# Patient Record
Sex: Female | Born: 1975
Health system: Southern US, Community
[De-identification: ages and names within clinical notes are randomized; demographics above are authoritative.]

## PROBLEM LIST (undated history)

## (undated) ENCOUNTER — Inpatient Hospital Stay (HOSPITAL_COMMUNITY): Payer: Self-pay

## (undated) DIAGNOSIS — R238 Other skin changes: Secondary | ICD-10-CM

## (undated) DIAGNOSIS — B373 Candidiasis of vulva and vagina: Secondary | ICD-10-CM

## (undated) DIAGNOSIS — M797 Fibromyalgia: Secondary | ICD-10-CM

## (undated) DIAGNOSIS — R233 Spontaneous ecchymoses: Secondary | ICD-10-CM

## (undated) DIAGNOSIS — Z87440 Personal history of urinary (tract) infections: Secondary | ICD-10-CM

## (undated) DIAGNOSIS — Z862 Personal history of diseases of the blood and blood-forming organs and certain disorders involving the immune mechanism: Secondary | ICD-10-CM

## (undated) DIAGNOSIS — Z860100 Personal history of colon polyps, unspecified: Secondary | ICD-10-CM

## (undated) DIAGNOSIS — B999 Unspecified infectious disease: Secondary | ICD-10-CM

## (undated) DIAGNOSIS — K529 Noninfective gastroenteritis and colitis, unspecified: Secondary | ICD-10-CM

## (undated) DIAGNOSIS — I82409 Acute embolism and thrombosis of unspecified deep veins of unspecified lower extremity: Secondary | ICD-10-CM

## (undated) DIAGNOSIS — R61 Generalized hyperhidrosis: Secondary | ICD-10-CM

## (undated) DIAGNOSIS — S92909A Unspecified fracture of unspecified foot, initial encounter for closed fracture: Secondary | ICD-10-CM

## (undated) DIAGNOSIS — B9689 Other specified bacterial agents as the cause of diseases classified elsewhere: Secondary | ICD-10-CM

## (undated) DIAGNOSIS — R634 Abnormal weight loss: Secondary | ICD-10-CM

## (undated) DIAGNOSIS — T7840XA Allergy, unspecified, initial encounter: Secondary | ICD-10-CM

## (undated) DIAGNOSIS — N926 Irregular menstruation, unspecified: Secondary | ICD-10-CM

## (undated) DIAGNOSIS — Z8742 Personal history of other diseases of the female genital tract: Secondary | ICD-10-CM

## (undated) DIAGNOSIS — D573 Sickle-cell trait: Secondary | ICD-10-CM

## (undated) DIAGNOSIS — N760 Acute vaginitis: Secondary | ICD-10-CM

## (undated) DIAGNOSIS — G43909 Migraine, unspecified, not intractable, without status migrainosus: Secondary | ICD-10-CM

## (undated) DIAGNOSIS — I2699 Other pulmonary embolism without acute cor pulmonale: Secondary | ICD-10-CM

## (undated) DIAGNOSIS — D219 Benign neoplasm of connective and other soft tissue, unspecified: Secondary | ICD-10-CM

## (undated) DIAGNOSIS — Z8371 Family history of colonic polyps: Secondary | ICD-10-CM

## (undated) DIAGNOSIS — M1990 Unspecified osteoarthritis, unspecified site: Secondary | ICD-10-CM

## (undated) DIAGNOSIS — F419 Anxiety disorder, unspecified: Secondary | ICD-10-CM

## (undated) DIAGNOSIS — Z8041 Family history of malignant neoplasm of ovary: Secondary | ICD-10-CM

## (undated) DIAGNOSIS — D649 Anemia, unspecified: Secondary | ICD-10-CM

## (undated) DIAGNOSIS — E669 Obesity, unspecified: Secondary | ICD-10-CM

## (undated) DIAGNOSIS — N63 Unspecified lump in unspecified breast: Secondary | ICD-10-CM

## (undated) DIAGNOSIS — IMO0002 Reserved for concepts with insufficient information to code with codable children: Secondary | ICD-10-CM

## (undated) DIAGNOSIS — I10 Essential (primary) hypertension: Secondary | ICD-10-CM

## (undated) DIAGNOSIS — D259 Leiomyoma of uterus, unspecified: Secondary | ICD-10-CM

## (undated) DIAGNOSIS — A599 Trichomoniasis, unspecified: Secondary | ICD-10-CM

## (undated) DIAGNOSIS — N938 Other specified abnormal uterine and vaginal bleeding: Secondary | ICD-10-CM

## (undated) DIAGNOSIS — C801 Malignant (primary) neoplasm, unspecified: Secondary | ICD-10-CM

## (undated) DIAGNOSIS — R5383 Other fatigue: Secondary | ICD-10-CM

## (undated) DIAGNOSIS — M545 Low back pain: Secondary | ICD-10-CM

## (undated) DIAGNOSIS — M542 Cervicalgia: Secondary | ICD-10-CM

## (undated) DIAGNOSIS — F329 Major depressive disorder, single episode, unspecified: Secondary | ICD-10-CM

## (undated) DIAGNOSIS — R011 Cardiac murmur, unspecified: Secondary | ICD-10-CM

## (undated) DIAGNOSIS — K219 Gastro-esophageal reflux disease without esophagitis: Secondary | ICD-10-CM

## (undated) DIAGNOSIS — R6889 Other general symptoms and signs: Secondary | ICD-10-CM

## (undated) DIAGNOSIS — M461 Sacroiliitis, not elsewhere classified: Secondary | ICD-10-CM

## (undated) DIAGNOSIS — K589 Irritable bowel syndrome without diarrhea: Secondary | ICD-10-CM

## (undated) DIAGNOSIS — Z83719 Family history of colon polyps, unspecified: Secondary | ICD-10-CM

## (undated) DIAGNOSIS — R112 Nausea with vomiting, unspecified: Secondary | ICD-10-CM

## (undated) DIAGNOSIS — Z803 Family history of malignant neoplasm of breast: Secondary | ICD-10-CM

## (undated) DIAGNOSIS — N83209 Unspecified ovarian cyst, unspecified side: Secondary | ICD-10-CM

## (undated) DIAGNOSIS — D759 Disease of blood and blood-forming organs, unspecified: Secondary | ICD-10-CM

## (undated) DIAGNOSIS — Z5189 Encounter for other specified aftercare: Secondary | ICD-10-CM

## (undated) HISTORY — DX: Low back pain: M54.5

## (undated) HISTORY — DX: Unspecified lump in unspecified breast: N63.0

## (undated) HISTORY — DX: Other skin changes: R23.8

## (undated) HISTORY — DX: Nausea with vomiting, unspecified: R11.2

## (undated) HISTORY — DX: Other fatigue: R53.83

## (undated) HISTORY — DX: Unspecified osteoarthritis, unspecified site: M19.90

## (undated) HISTORY — DX: Personal history of colonic polyps: Z86.010

## (undated) HISTORY — DX: Essential (primary) hypertension: I10

## (undated) HISTORY — DX: Fibromyalgia: M79.7

## (undated) HISTORY — DX: Other specified abnormal uterine and vaginal bleeding: N93.8

## (undated) HISTORY — DX: Abnormal weight loss: R63.4

## (undated) HISTORY — DX: Major depressive disorder, single episode, unspecified: F32.9

## (undated) HISTORY — DX: Spontaneous ecchymoses: R23.3

## (undated) HISTORY — DX: Anxiety disorder, unspecified: F41.9

## (undated) HISTORY — DX: Cardiac murmur, unspecified: R01.1

## (undated) HISTORY — DX: Family history of malignant neoplasm of ovary: Z80.41

## (undated) HISTORY — DX: Candidiasis of vulva and vagina: B37.3

## (undated) HISTORY — DX: Acute vaginitis: N76.0

## (undated) HISTORY — DX: Unspecified infectious disease: B99.9

## (undated) HISTORY — DX: Unspecified fracture of unspecified foot, initial encounter for closed fracture: S92.909A

## (undated) HISTORY — PX: CYSTECTOMY: SUR359

## (undated) HISTORY — DX: Obesity, unspecified: E66.9

## (undated) HISTORY — DX: Reserved for concepts with insufficient information to code with codable children: IMO0002

## (undated) HISTORY — DX: Migraine, unspecified, not intractable, without status migrainosus: G43.909

## (undated) HISTORY — DX: Personal history of other diseases of the female genital tract: Z87.42

## (undated) HISTORY — DX: Family history of colonic polyps: Z83.71

## (undated) HISTORY — DX: Gastro-esophageal reflux disease without esophagitis: K21.9

## (undated) HISTORY — DX: Allergy, unspecified, initial encounter: T78.40XA

## (undated) HISTORY — DX: Disease of blood and blood-forming organs, unspecified: D75.9

## (undated) HISTORY — DX: Family history of malignant neoplasm of breast: Z80.3

## (undated) HISTORY — PX: DILATION AND CURETTAGE OF UTERUS: SHX78

## (undated) HISTORY — DX: Irritable bowel syndrome, unspecified: K58.9

## (undated) HISTORY — DX: Other general symptoms and signs: R68.89

## (undated) HISTORY — DX: Noninfective gastroenteritis and colitis, unspecified: K52.9

## (undated) HISTORY — DX: Unspecified ovarian cyst, unspecified side: N83.209

## (undated) HISTORY — DX: Benign neoplasm of connective and other soft tissue, unspecified: D21.9

## (undated) HISTORY — DX: Personal history of diseases of the blood and blood-forming organs and certain disorders involving the immune mechanism: Z86.2

## (undated) HISTORY — DX: Cervicalgia: M54.2

## (undated) HISTORY — DX: Acute embolism and thrombosis of unspecified deep veins of unspecified lower extremity: I82.409

## (undated) HISTORY — DX: Anemia, unspecified: D64.9

## (undated) HISTORY — DX: Irregular menstruation, unspecified: N92.6

## (undated) HISTORY — DX: Sickle-cell trait: D57.3

## (undated) HISTORY — DX: Leiomyoma of uterus, unspecified: D25.9

## (undated) HISTORY — DX: Personal history of urinary (tract) infections: Z87.440

## (undated) HISTORY — DX: Generalized hyperhidrosis: R61

## (undated) HISTORY — PX: OTHER SURGICAL HISTORY: SHX169

## (undated) HISTORY — DX: Other specified bacterial agents as the cause of diseases classified elsewhere: B96.89

## (undated) HISTORY — DX: Sacroiliitis, not elsewhere classified: M46.1

## (undated) HISTORY — DX: Encounter for other specified aftercare: Z51.89

## (undated) HISTORY — DX: Family history of colon polyps, unspecified: Z83.719

## (undated) HISTORY — DX: Personal history of colon polyps, unspecified: Z86.0100

## (undated) HISTORY — DX: Trichomoniasis, unspecified: A59.9

## (undated) HISTORY — PX: WISDOM TOOTH EXTRACTION: SHX21

---

## 1995-04-10 DIAGNOSIS — R87619 Unspecified abnormal cytological findings in specimens from cervix uteri: Secondary | ICD-10-CM

## 1995-04-10 DIAGNOSIS — IMO0002 Reserved for concepts with insufficient information to code with codable children: Secondary | ICD-10-CM

## 1995-04-10 HISTORY — PX: CERVICAL CONE BIOPSY: SUR198

## 1995-04-10 HISTORY — DX: Reserved for concepts with insufficient information to code with codable children: IMO0002

## 1995-04-10 HISTORY — DX: Unspecified abnormal cytological findings in specimens from cervix uteri: R87.619

## 1997-08-17 ENCOUNTER — Ambulatory Visit (HOSPITAL_COMMUNITY): Admission: RE | Admit: 1997-08-17 | Discharge: 1997-08-17 | Payer: Self-pay | Admitting: *Deleted

## 2000-03-10 ENCOUNTER — Inpatient Hospital Stay (HOSPITAL_COMMUNITY): Admission: AD | Admit: 2000-03-10 | Discharge: 2000-03-10 | Payer: Self-pay | Admitting: Obstetrics & Gynecology

## 2000-04-16 ENCOUNTER — Encounter (HOSPITAL_COMMUNITY): Admission: RE | Admit: 2000-04-16 | Discharge: 2000-07-15 | Payer: Self-pay | Admitting: *Deleted

## 2000-06-13 ENCOUNTER — Inpatient Hospital Stay (HOSPITAL_COMMUNITY): Admission: AD | Admit: 2000-06-13 | Discharge: 2000-06-13 | Payer: Self-pay | Admitting: Obstetrics & Gynecology

## 2000-08-17 ENCOUNTER — Inpatient Hospital Stay (HOSPITAL_COMMUNITY): Admission: AD | Admit: 2000-08-17 | Discharge: 2000-08-18 | Payer: Self-pay | Admitting: Obstetrics

## 2000-10-11 ENCOUNTER — Encounter: Payer: Self-pay | Admitting: Obstetrics and Gynecology

## 2000-10-11 ENCOUNTER — Ambulatory Visit (HOSPITAL_COMMUNITY): Admission: RE | Admit: 2000-10-11 | Discharge: 2000-10-11 | Payer: Self-pay | Admitting: Obstetrics and Gynecology

## 2000-10-11 ENCOUNTER — Other Ambulatory Visit: Admission: RE | Admit: 2000-10-11 | Discharge: 2000-10-11 | Payer: Self-pay | Admitting: Obstetrics and Gynecology

## 2000-10-30 ENCOUNTER — Ambulatory Visit (HOSPITAL_COMMUNITY): Admission: RE | Admit: 2000-10-30 | Discharge: 2000-10-30 | Payer: Self-pay | Admitting: Obstetrics and Gynecology

## 2000-10-30 ENCOUNTER — Encounter: Payer: Self-pay | Admitting: Obstetrics and Gynecology

## 2000-11-19 ENCOUNTER — Encounter (INDEPENDENT_AMBULATORY_CARE_PROVIDER_SITE_OTHER): Payer: Self-pay

## 2000-11-19 ENCOUNTER — Inpatient Hospital Stay (HOSPITAL_COMMUNITY): Admission: RE | Admit: 2000-11-19 | Discharge: 2000-11-21 | Payer: Self-pay | Admitting: Obstetrics and Gynecology

## 2000-12-13 ENCOUNTER — Ambulatory Visit (HOSPITAL_COMMUNITY): Admission: RE | Admit: 2000-12-13 | Discharge: 2000-12-13 | Payer: Self-pay | Admitting: Obstetrics and Gynecology

## 2000-12-13 ENCOUNTER — Encounter: Payer: Self-pay | Admitting: Obstetrics and Gynecology

## 2001-01-17 ENCOUNTER — Ambulatory Visit (HOSPITAL_COMMUNITY): Admission: RE | Admit: 2001-01-17 | Discharge: 2001-01-17 | Payer: Self-pay | Admitting: Obstetrics and Gynecology

## 2001-01-17 ENCOUNTER — Encounter: Payer: Self-pay | Admitting: Obstetrics and Gynecology

## 2001-02-21 ENCOUNTER — Ambulatory Visit (HOSPITAL_COMMUNITY): Admission: RE | Admit: 2001-02-21 | Discharge: 2001-02-21 | Payer: Self-pay | Admitting: Obstetrics and Gynecology

## 2001-02-21 ENCOUNTER — Encounter: Payer: Self-pay | Admitting: Obstetrics and Gynecology

## 2001-04-09 DIAGNOSIS — N938 Other specified abnormal uterine and vaginal bleeding: Secondary | ICD-10-CM

## 2001-04-09 DIAGNOSIS — N63 Unspecified lump in unspecified breast: Secondary | ICD-10-CM

## 2001-04-09 DIAGNOSIS — N926 Irregular menstruation, unspecified: Secondary | ICD-10-CM

## 2001-04-09 DIAGNOSIS — Z8742 Personal history of other diseases of the female genital tract: Secondary | ICD-10-CM

## 2001-04-09 DIAGNOSIS — N83209 Unspecified ovarian cyst, unspecified side: Secondary | ICD-10-CM

## 2001-04-09 DIAGNOSIS — E669 Obesity, unspecified: Secondary | ICD-10-CM

## 2001-04-09 DIAGNOSIS — M545 Low back pain, unspecified: Secondary | ICD-10-CM

## 2001-04-09 DIAGNOSIS — Z87440 Personal history of urinary (tract) infections: Secondary | ICD-10-CM

## 2001-04-09 DIAGNOSIS — F32A Depression, unspecified: Secondary | ICD-10-CM

## 2001-04-09 HISTORY — DX: Low back pain, unspecified: M54.50

## 2001-04-09 HISTORY — DX: Personal history of other diseases of the female genital tract: Z87.42

## 2001-04-09 HISTORY — DX: Depression, unspecified: F32.A

## 2001-04-09 HISTORY — DX: Unspecified lump in unspecified breast: N63.0

## 2001-04-09 HISTORY — DX: Irregular menstruation, unspecified: N92.6

## 2001-04-09 HISTORY — DX: Obesity, unspecified: E66.9

## 2001-04-09 HISTORY — DX: Personal history of urinary (tract) infections: Z87.440

## 2001-04-09 HISTORY — DX: Other specified abnormal uterine and vaginal bleeding: N93.8

## 2001-04-09 HISTORY — DX: Unspecified ovarian cyst, unspecified side: N83.209

## 2001-04-11 ENCOUNTER — Inpatient Hospital Stay (HOSPITAL_COMMUNITY): Admission: AD | Admit: 2001-04-11 | Discharge: 2001-04-11 | Payer: Self-pay | Admitting: Obstetrics and Gynecology

## 2001-04-25 ENCOUNTER — Inpatient Hospital Stay (HOSPITAL_COMMUNITY): Admission: AD | Admit: 2001-04-25 | Discharge: 2001-04-26 | Payer: Self-pay | Admitting: Obstetrics and Gynecology

## 2001-05-22 ENCOUNTER — Other Ambulatory Visit: Admission: RE | Admit: 2001-05-22 | Discharge: 2001-05-22 | Payer: Self-pay | Admitting: Obstetrics and Gynecology

## 2001-12-26 ENCOUNTER — Ambulatory Visit (HOSPITAL_COMMUNITY): Admission: RE | Admit: 2001-12-26 | Discharge: 2001-12-26 | Payer: Self-pay | Admitting: Hematology & Oncology

## 2001-12-26 ENCOUNTER — Encounter: Payer: Self-pay | Admitting: Hematology & Oncology

## 2002-01-02 ENCOUNTER — Other Ambulatory Visit: Admission: RE | Admit: 2002-01-02 | Discharge: 2002-01-02 | Payer: Self-pay | Admitting: Obstetrics and Gynecology

## 2002-02-25 ENCOUNTER — Encounter: Payer: Self-pay | Admitting: Hematology & Oncology

## 2002-02-25 ENCOUNTER — Ambulatory Visit (HOSPITAL_COMMUNITY): Admission: RE | Admit: 2002-02-25 | Discharge: 2002-02-25 | Payer: Self-pay | Admitting: Hematology & Oncology

## 2002-04-09 DIAGNOSIS — M797 Fibromyalgia: Secondary | ICD-10-CM

## 2002-04-09 DIAGNOSIS — N760 Acute vaginitis: Secondary | ICD-10-CM

## 2002-04-09 HISTORY — DX: Fibromyalgia: M79.7

## 2002-04-09 HISTORY — DX: Acute vaginitis: N76.0

## 2002-06-13 ENCOUNTER — Encounter: Payer: Self-pay | Admitting: Emergency Medicine

## 2002-06-13 ENCOUNTER — Emergency Department (HOSPITAL_COMMUNITY): Admission: EM | Admit: 2002-06-13 | Discharge: 2002-06-13 | Payer: Self-pay | Admitting: Emergency Medicine

## 2002-09-11 ENCOUNTER — Inpatient Hospital Stay (HOSPITAL_COMMUNITY): Admission: AD | Admit: 2002-09-11 | Discharge: 2002-09-11 | Payer: Self-pay | Admitting: Obstetrics and Gynecology

## 2002-09-18 ENCOUNTER — Inpatient Hospital Stay (HOSPITAL_COMMUNITY): Admission: AD | Admit: 2002-09-18 | Discharge: 2002-09-18 | Payer: Self-pay | Admitting: Obstetrics and Gynecology

## 2002-09-19 ENCOUNTER — Encounter: Payer: Self-pay | Admitting: Obstetrics and Gynecology

## 2002-09-21 ENCOUNTER — Inpatient Hospital Stay (HOSPITAL_COMMUNITY): Admission: RE | Admit: 2002-09-21 | Discharge: 2002-09-21 | Payer: Self-pay | Admitting: Obstetrics and Gynecology

## 2002-09-21 ENCOUNTER — Encounter: Payer: Self-pay | Admitting: Obstetrics and Gynecology

## 2002-09-28 ENCOUNTER — Encounter: Payer: Self-pay | Admitting: Obstetrics and Gynecology

## 2002-09-28 ENCOUNTER — Ambulatory Visit (HOSPITAL_COMMUNITY): Admission: RE | Admit: 2002-09-28 | Discharge: 2002-09-28 | Payer: Self-pay | Admitting: Obstetrics and Gynecology

## 2003-01-05 ENCOUNTER — Other Ambulatory Visit: Admission: RE | Admit: 2003-01-05 | Discharge: 2003-01-05 | Payer: Self-pay | Admitting: Obstetrics and Gynecology

## 2003-04-10 DIAGNOSIS — Z5189 Encounter for other specified aftercare: Secondary | ICD-10-CM

## 2003-04-10 DIAGNOSIS — I10 Essential (primary) hypertension: Secondary | ICD-10-CM

## 2003-04-10 HISTORY — DX: Encounter for other specified aftercare: Z51.89

## 2003-04-10 HISTORY — DX: Essential (primary) hypertension: I10

## 2003-04-20 ENCOUNTER — Ambulatory Visit (HOSPITAL_COMMUNITY): Admission: RE | Admit: 2003-04-20 | Discharge: 2003-04-20 | Payer: Self-pay | Admitting: Obstetrics and Gynecology

## 2003-04-26 ENCOUNTER — Inpatient Hospital Stay (HOSPITAL_COMMUNITY): Admission: AD | Admit: 2003-04-26 | Discharge: 2003-04-27 | Payer: Self-pay | Admitting: Obstetrics and Gynecology

## 2003-07-13 ENCOUNTER — Inpatient Hospital Stay (HOSPITAL_COMMUNITY): Admission: AD | Admit: 2003-07-13 | Discharge: 2003-07-13 | Payer: Self-pay | Admitting: Obstetrics and Gynecology

## 2003-07-26 ENCOUNTER — Ambulatory Visit (HOSPITAL_COMMUNITY): Admission: RE | Admit: 2003-07-26 | Discharge: 2003-07-26 | Payer: Self-pay | Admitting: Obstetrics and Gynecology

## 2003-08-13 ENCOUNTER — Ambulatory Visit (HOSPITAL_COMMUNITY): Admission: RE | Admit: 2003-08-13 | Discharge: 2003-08-13 | Payer: Self-pay | Admitting: Obstetrics and Gynecology

## 2003-09-02 ENCOUNTER — Ambulatory Visit (HOSPITAL_COMMUNITY): Admission: RE | Admit: 2003-09-02 | Discharge: 2003-09-02 | Payer: Self-pay | Admitting: Obstetrics and Gynecology

## 2003-09-08 ENCOUNTER — Encounter (INDEPENDENT_AMBULATORY_CARE_PROVIDER_SITE_OTHER): Payer: Self-pay | Admitting: *Deleted

## 2003-09-08 ENCOUNTER — Inpatient Hospital Stay (HOSPITAL_COMMUNITY): Admission: AD | Admit: 2003-09-08 | Discharge: 2003-09-10 | Payer: Self-pay | Admitting: Obstetrics and Gynecology

## 2003-11-20 ENCOUNTER — Inpatient Hospital Stay (HOSPITAL_COMMUNITY): Admission: AD | Admit: 2003-11-20 | Discharge: 2003-11-20 | Payer: Self-pay | Admitting: Obstetrics and Gynecology

## 2003-12-06 ENCOUNTER — Other Ambulatory Visit: Admission: RE | Admit: 2003-12-06 | Discharge: 2003-12-06 | Payer: Self-pay | Admitting: Obstetrics and Gynecology

## 2003-12-07 ENCOUNTER — Ambulatory Visit (HOSPITAL_COMMUNITY): Admission: RE | Admit: 2003-12-07 | Discharge: 2003-12-07 | Payer: Self-pay | Admitting: Obstetrics and Gynecology

## 2004-04-09 DIAGNOSIS — Z8742 Personal history of other diseases of the female genital tract: Secondary | ICD-10-CM

## 2004-04-09 HISTORY — DX: Personal history of other diseases of the female genital tract: Z87.42

## 2005-01-24 ENCOUNTER — Other Ambulatory Visit: Admission: RE | Admit: 2005-01-24 | Discharge: 2005-01-24 | Payer: Self-pay | Admitting: Obstetrics and Gynecology

## 2005-04-09 DIAGNOSIS — K219 Gastro-esophageal reflux disease without esophagitis: Secondary | ICD-10-CM

## 2005-04-09 DIAGNOSIS — R011 Cardiac murmur, unspecified: Secondary | ICD-10-CM

## 2005-04-09 HISTORY — DX: Gastro-esophageal reflux disease without esophagitis: K21.9

## 2005-04-09 HISTORY — DX: Cardiac murmur, unspecified: R01.1

## 2005-07-18 ENCOUNTER — Encounter: Admission: RE | Admit: 2005-07-18 | Discharge: 2005-07-18 | Payer: Self-pay | Admitting: Internal Medicine

## 2005-11-30 ENCOUNTER — Ambulatory Visit (HOSPITAL_COMMUNITY): Admission: RE | Admit: 2005-11-30 | Discharge: 2005-11-30 | Payer: Self-pay | Admitting: Family Medicine

## 2005-12-02 ENCOUNTER — Inpatient Hospital Stay (HOSPITAL_COMMUNITY): Admission: AD | Admit: 2005-12-02 | Discharge: 2005-12-02 | Payer: Self-pay | Admitting: Obstetrics and Gynecology

## 2005-12-10 ENCOUNTER — Inpatient Hospital Stay (HOSPITAL_COMMUNITY): Admission: AD | Admit: 2005-12-10 | Discharge: 2005-12-10 | Payer: Self-pay | Admitting: Obstetrics and Gynecology

## 2006-02-19 ENCOUNTER — Other Ambulatory Visit: Admission: RE | Admit: 2006-02-19 | Discharge: 2006-02-19 | Payer: Self-pay | Admitting: Obstetrics and Gynecology

## 2007-03-03 ENCOUNTER — Ambulatory Visit (HOSPITAL_COMMUNITY): Admission: RE | Admit: 2007-03-03 | Discharge: 2007-03-03 | Payer: Self-pay | Admitting: General Surgery

## 2007-03-04 ENCOUNTER — Encounter: Admission: RE | Admit: 2007-03-04 | Discharge: 2007-06-02 | Payer: Self-pay | Admitting: General Surgery

## 2007-05-20 ENCOUNTER — Ambulatory Visit (HOSPITAL_COMMUNITY): Admission: RE | Admit: 2007-05-20 | Discharge: 2007-05-21 | Payer: Self-pay | Admitting: General Surgery

## 2007-05-20 HISTORY — PX: LAPAROSCOPIC GASTRIC BANDING: SHX1100

## 2007-06-11 ENCOUNTER — Encounter: Admission: RE | Admit: 2007-06-11 | Discharge: 2007-06-11 | Payer: Self-pay | Admitting: General Surgery

## 2008-02-20 ENCOUNTER — Encounter: Admission: RE | Admit: 2008-02-20 | Discharge: 2008-02-20 | Payer: Self-pay | Admitting: Obstetrics and Gynecology

## 2008-03-02 ENCOUNTER — Ambulatory Visit (HOSPITAL_COMMUNITY): Admission: RE | Admit: 2008-03-02 | Discharge: 2008-03-02 | Payer: Self-pay | Admitting: General Surgery

## 2008-07-30 ENCOUNTER — Ambulatory Visit: Payer: Self-pay | Admitting: Oncology

## 2008-08-06 LAB — CBC WITH DIFFERENTIAL/PLATELET
Basophils Absolute: 0 10*3/uL (ref 0.0–0.1)
Eosinophils Absolute: 0.1 10*3/uL (ref 0.0–0.5)
HCT: 37.5 % (ref 34.8–46.6)
MCH: 31 pg (ref 25.1–34.0)
MCV: 91.5 fL (ref 79.5–101.0)
MONO#: 0.3 10*3/uL (ref 0.1–0.9)
MONO%: 7.3 % (ref 0.0–14.0)
NEUT#: 1.4 10*3/uL — ABNORMAL LOW (ref 1.5–6.5)
NEUT%: 34 % — ABNORMAL LOW (ref 38.4–76.8)
RDW: 13.9 % (ref 11.2–14.5)
WBC: 4.3 10*3/uL (ref 3.9–10.3)
lymph#: 2.3 10*3/uL (ref 0.9–3.3)

## 2008-08-06 LAB — COMPREHENSIVE METABOLIC PANEL
Alkaline Phosphatase: 36 U/L — ABNORMAL LOW (ref 39–117)
BUN: 4 mg/dL — ABNORMAL LOW (ref 6–23)
CO2: 26 mEq/L (ref 19–32)
Creatinine, Ser: 1 mg/dL (ref 0.40–1.20)
Glucose, Bld: 77 mg/dL (ref 70–99)
Total Bilirubin: 0.6 mg/dL (ref 0.3–1.2)

## 2008-08-06 LAB — MORPHOLOGY: RBC Comments: NORMAL

## 2008-08-06 LAB — CHCC SMEAR

## 2008-08-11 LAB — APTT: aPTT: 33 seconds (ref 24–37)

## 2008-08-11 LAB — VON WILLEBRAND FACTOR MULTIMER
Factor-VIII Activity: 71 % (ref 50–180)
Von Willebrand Factor Ag: 124 % (ref >49)

## 2008-08-23 LAB — ANTI-DNA ANTIBODY, DOUBLE-STRANDED: ds DNA Ab: 2 IU/mL (ref <5)

## 2008-08-26 LAB — PLATELET AGGREGATION STUDY, BLOOD
ADP10: NORMAL
Arachidonic Acid: NORMAL
Collagen Aggregation: NORMAL
Epinephrine 10: NORMAL

## 2009-04-09 DIAGNOSIS — Z8742 Personal history of other diseases of the female genital tract: Secondary | ICD-10-CM

## 2009-04-09 HISTORY — DX: Personal history of other diseases of the female genital tract: Z87.42

## 2009-04-09 HISTORY — PX: AUGMENTATION MAMMAPLASTY: SUR837

## 2009-06-21 ENCOUNTER — Ambulatory Visit (HOSPITAL_COMMUNITY): Admission: RE | Admit: 2009-06-21 | Discharge: 2009-06-21 | Payer: Self-pay | Admitting: Psychiatry

## 2009-06-27 ENCOUNTER — Other Ambulatory Visit (HOSPITAL_COMMUNITY): Admission: RE | Admit: 2009-06-27 | Discharge: 2009-09-25 | Payer: Self-pay | Admitting: Psychiatry

## 2009-07-04 ENCOUNTER — Ambulatory Visit: Payer: Self-pay | Admitting: Psychiatry

## 2009-11-11 ENCOUNTER — Emergency Department (HOSPITAL_COMMUNITY): Admission: EM | Admit: 2009-11-11 | Discharge: 2009-11-12 | Payer: Self-pay | Admitting: Emergency Medicine

## 2009-11-14 ENCOUNTER — Ambulatory Visit: Payer: Self-pay | Admitting: Vascular Surgery

## 2009-11-14 ENCOUNTER — Ambulatory Visit: Admission: RE | Admit: 2009-11-14 | Discharge: 2009-11-14 | Payer: Self-pay

## 2010-04-09 HISTORY — PX: COSMETIC SURGERY: SHX468

## 2010-04-24 DIAGNOSIS — B373 Candidiasis of vulva and vagina: Secondary | ICD-10-CM

## 2010-04-24 DIAGNOSIS — B3731 Acute candidiasis of vulva and vagina: Secondary | ICD-10-CM

## 2010-04-24 HISTORY — DX: Candidiasis of vulva and vagina: B37.3

## 2010-04-24 HISTORY — DX: Acute candidiasis of vulva and vagina: B37.31

## 2010-05-01 ENCOUNTER — Encounter: Payer: Self-pay | Admitting: General Surgery

## 2010-06-23 LAB — DIFFERENTIAL
Basophils Relative: 0 % (ref 0–1)
Lymphocytes Relative: 52 % — ABNORMAL HIGH (ref 12–46)
Monocytes Absolute: 0.6 10*3/uL (ref 0.1–1.0)
Monocytes Relative: 10 % (ref 3–12)
Neutro Abs: 1.9 10*3/uL (ref 1.7–7.7)
Neutrophils Relative %: 33 % — ABNORMAL LOW (ref 43–77)

## 2010-06-23 LAB — POCT I-STAT, CHEM 8
Creatinine, Ser: 1 mg/dL (ref 0.4–1.2)
HCT: 31 % — ABNORMAL LOW (ref 36.0–46.0)
Hemoglobin: 10.5 g/dL — ABNORMAL LOW (ref 12.0–15.0)
TCO2: 28 mmol/L (ref 0–100)

## 2010-06-23 LAB — CBC
HCT: 29.2 % — ABNORMAL LOW (ref 36.0–46.0)
MCH: 31.1 pg (ref 26.0–34.0)
MCHC: 35.3 g/dL (ref 30.0–36.0)
MCV: 88.2 fL (ref 78.0–100.0)
RDW: 14.5 % (ref 11.5–15.5)

## 2010-08-22 NOTE — Op Note (Signed)
Tiffany [name], Tiffany [name]               ACCOUNT NO.:  [id number]   MEDICAL RECORD NO.:  [id number]          PATIENT TYPE:  OIB   LOCATION:  1527                         FACILITY:  [facility]   PHYSICIAN:  Sharlet Salina T. Hoxworth, M.D.DATE OF BIRTH:  24-May-1975   DATE OF PROCEDURE:  05/20/2007  DATE OF DISCHARGE:                               OPERATIVE REPORT   PRE AND POSTOPERATIVE DIAGNOSIS:  Morbid obesity.   SURGICAL PROCEDURES:  Placement laparoscopic adjustable gastric band  with hiatal hernia repair.   SURGEON:  Lorne Skeens. Hoxworth, M.D.   ASSISTANT:  Dr. Ovidio Kin   ANESTHESIA:  General.   BRIEF HISTORY:  [name] is a 35 year old female with progressive  morbid obesity unresponsive to medical management.  Following extensive  discussion of risks, benefits detailed elsewhere, we have elected to  proceed with laparoscopic adjustable gastric band placement.  She is now  brought to the operating room for this procedure.   DESCRIPTION OF OPERATION:  The patient brought to operating room and  placed in supine position on the operating table and general orotracheal  anesthesia was induced.  She received preoperative IV antibiotics.  Some  heparin was given subcutaneously preoperatively.  The abdomen was widely  sterilely prepped and draped.  Correct patient and procedure were  verified.  Local anesthesia was used to infiltrate the trocar sites  prior to the incision.  A 1 cm incision was made the left subcostal area  and access was obtained with 11 mm OptiVu trocar without difficulty and  pneumoperitoneum established.  Under direct vision a 15 mm trocar was  placed in the right upper quadrant through the falciform ligament and 11  mm trocar the right upper quadrant more medially, an 11 mm trocar in the  left above the umbilicus for camera port.  Through a 5-mm site in the  epigastrium the Nathanson's retractor was placed.  The left lobe of the  liver elevated with excellent exposure  of the hiatus and upper stomach.  Finally a 5 mm trocar was placed in the left flank.  Examination of the  hiatus revealed a good sized hiatal hernia.  Upper GI had suggested a  small to moderate hiatal hernia.  We elected to go ahead with repair.  The gastrocolic omentum was divided along the avascular area and this  dissection carried up over the top of the esophagus dividing the  peritoneum at the hiatus.  Lateral peritoneal attachments on the left  side room left in place for band of fixation.  The right crus was  identified and peritoneum along the anterior border of the right crus  was incised just below the esophagus and the right crus mobilized and  retroesophageal space entered.  The hiatus was dissected posteriorly and  the left crus was identified at its base and dissected more superiorly.  The distal esophagus was fully mobilized and brought down into the  abdomen under no tension and the crura and hiatus completely dissected.  After full dissection the hiatus was repaired with two posterior and one  anterior interrupted 0-0 Ethibond sutures.  After completion of  the  repair of the Ewald sizing tube was passed down into the stomach and  there was plenty of room for this through the hiatus with a little bit  of extra room to spare.  This appeared to be very adequate closure.  Attention was then turned to the band placement.  The peritoneum was  incised just anterior to the right crus more inferiorly at the area of  crossing fat and then careful blunt dissection carried into the  retrogastric area.  The finger dissector was passed into this space and  deployed up through the angle of his without difficulty.  A flushed AP  standard band system was then introduced in the abdomen and the tubing  and band brought back behind the stomach without difficulty.  The sizing  tube was then reintroduced into the stomach and the band buckled without  any tension.  The sizing tube removed.   Holding the tubing inferiorly.  The fundus of stomach was imbricated up over the band of the small  gastric pouch with several interrupted 2-0 Ethibond sutures.  The  abdomen inspected for hemostasis which appeared complete.  The tubing  was brought out through the right mid abdominal trocar site and  Nathanson retractor was removed, all CO2 evacuated and trocars removed.  Skin incisions were closed with interrupted or running subcuticular  Monocryl and Dermabond.  Sponge, needle and instrument counts correct.  The patient taken to recovery in good condition.      Lorne Skeens. Hoxworth, M.D.  Electronically Signed     BTH/MEDQ  D:  05/20/2007  T:  05/21/2007  Job:  [id number]

## 2010-08-22 NOTE — Op Note (Signed)
[name], Tiffany [name]               ACCOUNT NO.:  [id number]   MEDICAL RECORD NO.:  [id number]          PATIENT TYPE:  AMB   LOCATION:  DAY                          FACILITY:  [facility]   PHYSICIAN:  Sharlet Salina T. Hoxworth, M.D.DATE OF BIRTH:  [birth year]   DATE OF PROCEDURE:  03/02/2008  DATE OF DISCHARGE:  03/02/2008                               OPERATIVE REPORT   PREOPERATIVE DIAGNOSIS:  Status post lap band with leaking port.   POSTOPERATIVE DIAGNOSIS:  Status post lap band with leaking port.   SURGICAL PROCEDURES:  Replacement of lap band port.   SURGEON:  Lorne Skeens. Hoxworth, M.D.   ANESTHESIA:  General.   BRIEF HISTORY:  [name] is a 34 year old female status post lap  band placement on May 20, 2007.  She has had recently good weight  loss about 45 pounds but on three successive fills, we had we have found  less fluid in her band than expected and she has experienced immediate  loss of restriction a few days after the fill.  This consistent with a  leaking lap band port and I have recommended replacement.  The  indications for the procedure, its nature, risks of bleeding and  infection were discussed and understood.  She is now brought to the  operating room for this procedure.   DESCRIPTION OF OPERATION:  The patient was brought to the operating room  and placed in supine position on the operating table and general  anesthesia induced.  The abdomen was widely sterilely prepped and  draped.  She received preoperative IV antibiotics.  PAS were placed.  Correct patient and procedure were verified.  The previous transverse  incision in the right upper quadrant was excised and dissection carried  down through subcutaneous tissue onto the port.  The previous sutures  were removed.  Scar tissue removed and the port mobilized up out of the  incision.  At this point I clamped the tubing and injected the port  under the pressure but could not identify a specific leak.  I  aspirated  and found 5 of the expected 6 mL present.  The tubing was then divided  about 8 or 10 cm from the hub.  A new flushed port was attached.  This  was then sutured to the anterior fascia with four interrupted 2-0  Prolene sutures and the tubing introduced back in the abdominal cavity  with a smooth curve.  This port was then flushed and aspirated removing  all air and was filled with 5.5 mL.  Soft tissue had been infiltrated  with Marcaine previously.  There was no bleeding.  The wound was  irrigated.  The subcu was closed with running 2-0 Vicryl and the skin  with subcuticular Monocryl and Dermabond.  Sponge, needle and instrument  counts correct.  The patient taken to recovery in good condition.      Lorne Skeens. Hoxworth, M.D.  Electronically Signed     BTH/MEDQ  D:  03/02/2008  T:  03/03/2008  Job:  [id number]

## 2010-08-25 NOTE — Discharge Summary (Signed)
[name], Tiffany                         ACCOUNT NO.:  [id number]   MEDICAL RECORD NO.:  [id number]                   PATIENT TYPE:  OUT   LOCATION:  ULT                                  FACILITY:  [facility]   PHYSICIAN:  Tiffany A. Dillard, M.D.              DATE OF BIRTH:  Mar 04, 1976   DATE OF ADMISSION:  09/02/2003  DATE OF DISCHARGE:  09/02/2003                                 DISCHARGE SUMMARY   ADDENDUM:  Labs are as follows.  Hemoglobin 12.7, platelets 287.  She is A  positive, antibody negative.  Sickle cell trait positive.  Rubella immune.  Hepatitis B surface antigen was negative.  HIV nonreactive.  Pap was within  normal limits.  Gonorrhea and Chlamydia are negative.  Cystic fibrosis is  negative.  GC, Chlamydia and group B strep at 36 weeks are negative.  Quad  screen was within normal limits.   NOTE:  Please add these labs to #[id number].                                               Tiffany [name], M.D.    NAD/MEDQ  D:  09/07/2003  T:  09/07/2003  Job:  [id number]

## 2010-08-25 NOTE — H&P (Signed)
Tiffany [name], Tiffany [name]                         ACCOUNT NO.:  [id number]   MEDICAL RECORD NO.:  [id number]                   PATIENT TYPE:  OUT   LOCATION:  ULT                                  FACILITY:  [facility]   PHYSICIAN:  Naima A. Dillard, M.D.              DATE OF BIRTH:  07-Mar-1976   DATE OF ADMISSION:  09/02/2003  DATE OF DISCHARGE:  09/02/2003                                HISTORY & PHYSICAL   CHIEF COMPLAINT:  Chronic hypertension, at 38-2/7 weeks with stable cervix,  patient for induction.   HISTORY AND PHYSICAL:  The patient is a 35 year old African American female,  gravida 10, para 4-0-5-4, who is presenting to labor and delivery for  induction secondary to favorable cervix with chronic hypertension.  The  patient's pregnancy has been complicated by the following:  She has chronic  hypertension; she was started on Aldomet 250 mg twice a day; it started to  give her headaches and she stopped; her blood pressure has since been  controlled.  She has had growth ultrasounds every 3-4 weeks starting at 28  weeks with good growth and NSTs twice a week.  She has sickle cell trait;  the father of the baby is negative.  Her Chem-9's every month have been  normal.  The patient has a history of cauterization of her cervix, no  history of incompetent cervix and cervical __________  have been within  normal limits.  She has a history of intrauterine growth restriction,  history of migraines.  She had preterm labor at 31 weeks.  Fetal fibronectin  was found to be negative.  She was placed on bedrest and taken off at 35  weeks without any problems.  She also had bacterial vaginosis diagnosed at  26 weeks that was treated with Flagyl and she had first trimester bleeding  which eventually resolved.   PAST OBSTETRICAL HISTORY:  Past OB history is significant for:  1. In 1990, she had a female infant weighing 8 pounds and 4 ounces for a     vaginal delivery.  2. In May of 1992, she had a  spontaneous miscarriage.  No D&E was done and     no complications.  3. In December of 1992, she had another spontaneous miscarriage.  No D&E was     done and no complications.  4. In 1995, she had a spontaneous miscarriage in the first trimester.  No     D&E was done and no complications.  5. In 1996, she delivered a female infant who weighed 7 pounds without any     complications by vaginal delivery.  6. In 1997, she had a first trimester miscarriage with no D&E.  7. In 2002, she had a female infant weighing 7 pounds 9 ounces, vaginal     delivery without any problems.  8. In 2003, she had another female infant weighing 6 pounds 13 ounces without  any complications.  9. In 2004, she had a miscarriage in the first trimester.  No D&E was done     and no complications.   PAST GYNECOLOGICAL HISTORY:  Past GYN history significant for  1. Menarche occurring at age 77, coming every 28 days, lasting for 3-5 days.  2. The patient does have a history of a right ovarian cystectomy in 2003 in     early pregnancy.  3. In 1997, she had a cone biopsy of her cervix.  4. In 1993, she was diagnosed with Trichomonas.   PAST MEDICAL HISTORY:  Past medical history is significant for:  1. Chronic hypertension.  2. Migraines.  3. Ovarian cyst.  4. As above.  5. She has a history of sacroiliitis.   PAST SURGICAL HISTORY:  Past surgical history is significant for:  1. A right ovarian cystectomy in 2003.  2. A conization of the cervix in 1997.   SOCIAL HISTORY:  The patient has good social support with the father of the  baby and the grandmother.  She denies any alcohol, drugs or tobacco use.   MEDICATIONS:  Her medications currently are just folic acid and before  pregnancy, they consisted of Zyrtec, Celebrex, Flexeril p.r.n., Paxil,  hydrochlorothiazide, Lexapro and lidocaine patches.   GENETIC HISTORY:  Genetic history is unremarkable.   FAMILY HISTORY:  Family history is significant for father  with hypertension,  maternal grandmother with lung cancer, paternal grandmother with lung  cancer.   PHYSICAL EXAM:  GENERAL:  The patient is 38 weeks today.  Fetal heart tones  are 140-150.  She weighs 277 pounds.  She is in no apparent distress.  VITAL SIGNS:  Blood pressure is 128/80.  HEENT:  Head is normocephalic and atraumatic.  There is no thyromegaly.  HEART:  Heart is regular rate and rhythm.  LUNGS:  Lungs are clear to auscultation bilaterally.  BREAST EXAM:  No masses and no axillary masses.  HEART:  Regular rate and rhythm.  LUNGS:  Lungs are clear to auscultation bilaterally.  ABDOMEN:  Abdomen is soft, gravid and nontender.  PELVIC:  On vulvovaginal exam, she does have what appears to be a boil on  the left buttock but has not come to a head at the moment, otherwise,  vulvovaginal, no lesions, vagina is within normal limits.  Cervix is found  to be 3 cm, 50 and -2 station.  EXTREMITIES:  Extremities have trace edema with no calf tenderness  bilaterally.   ASSESSMENT:  Thirty-eight weeks and chronic hypertension, for induction.   PLAN:  Pitocin and assisted range of motion and anticipate vaginal delivery.  The patient understands there is a small risk of cesarean section with  induction.                                               Naima A. Normand Sloop, M.D.    NAD/MEDQ  D:  09/07/2003  T:  09/07/2003  Job:  [id number]

## 2010-08-25 NOTE — Consult Note (Signed)
[facility]  Patient:    Tiffany [name], Tiffany [name]                      MRN: [id number] Adm. Date:  [id number] Attending:  Tammi Sou                          Consultation Report  EMERGENCY ROOM CONSULT NOTE  REASON FOR EVALUATION:        Abscess, left groin area.  CLINICAL HISTORY:             This patient is a 35 year old who apparently was seen in [place] two weeks after delivery and was told she had a boil in the left perilabial area. She was put on Augmentin and the area got better, but now over the last several days, has been getting bigger with more and more pain.  The patient is otherwise in good health. She had no rectal symptoms or pain. She is currently on no medications except some Zyrtec for allergies. she has no known medical allergies. She is being evaluated for possible lupus.  ALLERGIES:                    None known.  PHYSICAL EXAMINATION:         On exam, she appears somewhat uncomfortable but has been afebrile in the emergency department. Urine pregnancy and urinalysis were both negative. On exam, she has an area of cellulitis with a pointing abscess just into the left thigh but right by the leg crease in about midway between the vagina and the rectum, in terms of anterior to posterior direction. There is induration around it, but it does not extend all the way up to the anus and digital rectal does not appear to show any fluctuants in the anal or rectal canal. Perhaps, this appears to be a localized abscess with a significant cellulitis around it.  After discussion with the patient, I prepped the area with some Betadine, anesthetized it with 1% Xylocaine and did an I&D and got a fair amount of purulent material out of the abscess cavity. There remains some induration but I could not detect a deeper cavity despite some further probing. It is dressed without a packing since it did not go very deeply but was more of  a superficial but fairly long, 2 cm area.  From this point, will put her on some antibiotics and reevaluate in 8 to 12 hours to see if will need to do further drainage with general anesthetic rather than local. In the meantime, will check a CBC. We have also sent material for culture. DD:  08/18/00 TD:  08/18/00 Job: [id number]

## 2010-11-17 ENCOUNTER — Ambulatory Visit
Admission: RE | Admit: 2010-11-17 | Discharge: 2010-11-17 | Disposition: A | Discharge status: Home / Self Care | Payer: Medicaid Other | Source: Ambulatory Visit | Attending: Physician Assistant | Admitting: Physician Assistant

## 2010-11-17 ENCOUNTER — Encounter (INDEPENDENT_AMBULATORY_CARE_PROVIDER_SITE_OTHER): Payer: Self-pay | Admitting: Physician Assistant

## 2010-11-17 ENCOUNTER — Ambulatory Visit (INDEPENDENT_AMBULATORY_CARE_PROVIDER_SITE_OTHER): Payer: Self-pay | Admitting: Physician Assistant

## 2010-11-17 VITALS — BP 122/84 | Ht 65.0 in | Wt 169.0 lb

## 2010-11-17 DIAGNOSIS — Z4651 Encounter for fitting and adjustment of gastric lap band: Secondary | ICD-10-CM

## 2010-11-17 DIAGNOSIS — R1110 Vomiting, unspecified: Secondary | ICD-10-CM

## 2010-11-17 NOTE — Patient Instructions (Signed)
Attend X-ray today. Return next Friday for re-evaluation.

## 2010-11-17 NOTE — Progress Notes (Signed)
  HISTORY: Tiffany [name] is a 35 y.o.female who received an AP-Standard lap-band in February 2009 by Dr. Johna Sheriff. She comes today complaining of persistent regurgitation, up to three times a day since her last fill 5 months ago. She has particular trouble with fibrous vegetables and meats. She says she tolerates fish and shellfish well for the most part. Many days, liquids give trouble as well.  VITAL SIGNS: Filed Vitals:   11/17/10 1058  BP: 122/84    PHYSICAL EXAM: Physical exam reveals a very well-appearing 35 y.o.female in no apparent distress Neurologic: Awake, alert, oriented Psych: Bright affect, conversant Respiratory: Breathing even and unlabored. No stridor or wheezing Abdomen: Soft, nontender, nondistended to palpation. Incisions well-healed. No incisional hernias. Port easily palpated. Extremities: Atraumatic, good range of motion.  ASSESMENT: 35 y.o.  female  s/p AP-Standard lap-band.   PLAN: We discussed at length the need to go on a bit of a band holiday just because of the length of time of regurgitation but the patient adamantly refused removal of more than .25 mL fluid. As such, we agreed that we'd remove 0.25 mL today, she'd receive a KUB to reassure ourselves of no slippage of the band and that she'd return in 1 week to go over results and check on progress. She voiced agreement.

## 2010-11-24 ENCOUNTER — Encounter (INDEPENDENT_AMBULATORY_CARE_PROVIDER_SITE_OTHER): Payer: Self-pay

## 2010-11-24 ENCOUNTER — Ambulatory Visit (INDEPENDENT_AMBULATORY_CARE_PROVIDER_SITE_OTHER): Payer: Medicaid Other | Admitting: Physician Assistant

## 2010-11-24 NOTE — Patient Instructions (Signed)
Return as needed, especially if you have difficulty swallowing solid foods.

## 2010-11-24 NOTE — Progress Notes (Signed)
  HISTORY: Tiffany [name] is a 35 y.o.female who received an AP-Standard lap-band in January 2012 by Dr. Daphine Deutscher. She comes in one week since her last appointment feeling much better. She has no vomiting or regurgitation and she is able to eat solids without difficulty.  VITAL SIGNS: Filed Vitals:   11/24/10 1421  BP: 122/82    PHYSICAL EXAM: Physical exam reveals a very well-appearing 35 y.o.female in no apparent distress Neurologic: Awake, alert, oriented Psych: Bright affect, conversant Respiratory: Breathing even and unlabored. No stridor or wheezing Extremities: Atraumatic, good range of motion. Skin: Warm, Dry, no rashes Musculoskeletal: Normal gait, Joints normal  ASSESMENT: 35 y.o.  female  s/p AP-Standard lap-band.   PLAN: She is definitely in the green zone today. I asked that she return if her hunger or portion sizes increase dramatically or if she has difficulty tolerating solids. She voiced understanding and agreement.

## 2010-12-29 LAB — BASIC METABOLIC PANEL
CO2: 28
Calcium: 8.4
GFR calc Af Amer: >60
Glucose, Bld: 81
Potassium: 4.1
Sodium: 137

## 2010-12-29 LAB — DIFFERENTIAL
Eosinophils Absolute: 0
Eosinophils Relative: 0
Lymphocytes Relative: 26
Lymphs Abs: 1.3
Monocytes Relative: 8
Neutrophils Relative %: 66

## 2010-12-29 LAB — CBC
HCT: 32.5 — ABNORMAL LOW
MCV: 86.3
Platelets: 214
RBC: 3.76 — ABNORMAL LOW
WBC: 5.1

## 2010-12-29 LAB — HEMOGLOBIN AND HEMATOCRIT, BLOOD: HCT: 36.8

## 2011-01-09 LAB — HEMOGLOBIN AND HEMATOCRIT, BLOOD
HCT: 36.4
Hemoglobin: 12.5

## 2011-01-09 LAB — BASIC METABOLIC PANEL
CO2: 29
Chloride: 102
GFR calc Af Amer: >60
Potassium: 2.7 — CL

## 2011-01-09 LAB — PREGNANCY, URINE: Preg Test, Ur: NEGATIVE

## 2011-04-05 ENCOUNTER — Encounter (INDEPENDENT_AMBULATORY_CARE_PROVIDER_SITE_OTHER): Payer: Self-pay | Admitting: General Surgery

## 2011-04-06 ENCOUNTER — Encounter (INDEPENDENT_AMBULATORY_CARE_PROVIDER_SITE_OTHER): Payer: Self-pay

## 2011-04-06 ENCOUNTER — Ambulatory Visit (INDEPENDENT_AMBULATORY_CARE_PROVIDER_SITE_OTHER): Payer: Medicaid Other | Admitting: Physician Assistant

## 2011-04-06 VITALS — BP 126/82 | HR 68 | Temp 97.9°F | Resp 16 | Ht 65.0 in | Wt 194.8 lb

## 2011-04-06 DIAGNOSIS — Z4651 Encounter for fitting and adjustment of gastric lap band: Secondary | ICD-10-CM

## 2011-04-06 DIAGNOSIS — E669 Obesity, unspecified: Secondary | ICD-10-CM

## 2011-04-06 NOTE — Patient Instructions (Signed)
Take clear liquids for the next 48 hours. Thin protein shakes are ok to start on Saturday evening. Call us if you have persistent vomiting or regurgitation, night cough or reflux symptoms. Return as scheduled or sooner if you notice no changes in hunger/portion sizes.   

## 2011-04-06 NOTE — Progress Notes (Signed)
  HISTORY: Tiffany [name] is a 35 y.o.female who received an AP-Standard lap-band in February 2009 by Dr. Johna Sheriff. She was last seen in August after having fluid removed for months of solid food dysphagia and it appeared that she was in the green zone. Unfortunately she has gained 20 lbs since then and is hungry and having increased portion sizes. She currently denies vomiting or regurgitation. She wants an adjustment today as she feels her portion sizes are too big and her weight is out of control.  VITAL SIGNS: Filed Vitals:   04/06/11 0914  BP: 126/82  Pulse: 68  Temp: 97.9 F (36.6 C)  Resp: 16    PHYSICAL EXAM: Physical exam reveals a very well-appearing 35 y.o.female in no apparent distress Neurologic: Awake, alert, oriented Psych: Bright affect, conversant Respiratory: Breathing even and unlabored. No stridor or wheezing Abdomen: Soft, nontender, nondistended to palpation. Incisions well-healed. No incisional hernias. Port easily palpated. Extremities: Atraumatic, good range of motion.  ASSESMENT: 35 y.o.  female  s/p AP-Standard lap-band.   PLAN: The patient's port was accessed with a 20G Huber needle without difficulty. Clear fluid was aspirated and 0.25 mL saline was added to the port to give a total predicted volume of 7.25 mL. The patient was able to swallow water without difficulty following the procedure and was instructed to take clear liquids for the next 24-48 hours and advance slowly as tolerated. If she has recurrent dysphagia with today's fill, I'll schedule her for an upper GI. Her KUB in August was normal.

## 2011-05-24 ENCOUNTER — Encounter (INDEPENDENT_AMBULATORY_CARE_PROVIDER_SITE_OTHER): Payer: Self-pay | Admitting: General Surgery

## 2011-09-30 ENCOUNTER — Other Ambulatory Visit: Payer: Self-pay | Admitting: Obstetrics and Gynecology

## 2011-11-20 ENCOUNTER — Telehealth: Payer: Self-pay | Admitting: Obstetrics and Gynecology

## 2011-11-20 NOTE — Telephone Encounter (Signed)
Spoke with pt regarding discharge with odor. Appt offered tomorrow with DD. Pt declines and prefers Friday. Pt also requests pregnancy test due to + UPT at home. Appt scheduled 11/23/11 @ 11:15 am with DD. Pt agrees and voices understanding.

## 2011-11-23 ENCOUNTER — Encounter: Payer: Self-pay | Admitting: Obstetrics and Gynecology

## 2011-11-23 ENCOUNTER — Ambulatory Visit (INDEPENDENT_AMBULATORY_CARE_PROVIDER_SITE_OTHER): Payer: Medicaid Other | Admitting: Obstetrics and Gynecology

## 2011-11-23 VITALS — BP 108/78 | Temp 98.1°F | Resp 14 | Wt 165.0 lb

## 2011-11-23 DIAGNOSIS — B9689 Other specified bacterial agents as the cause of diseases classified elsewhere: Secondary | ICD-10-CM

## 2011-11-23 DIAGNOSIS — O3680X0 Pregnancy with inconclusive fetal viability, not applicable or unspecified: Secondary | ICD-10-CM

## 2011-11-23 DIAGNOSIS — R102 Pelvic and perineal pain unspecified side: Secondary | ICD-10-CM

## 2011-11-23 DIAGNOSIS — N949 Unspecified condition associated with female genital organs and menstrual cycle: Secondary | ICD-10-CM

## 2011-11-23 DIAGNOSIS — N898 Other specified noninflammatory disorders of vagina: Secondary | ICD-10-CM

## 2011-11-23 DIAGNOSIS — N760 Acute vaginitis: Secondary | ICD-10-CM

## 2011-11-23 DIAGNOSIS — A499 Bacterial infection, unspecified: Secondary | ICD-10-CM

## 2011-11-23 LAB — POCT WET PREP (WET MOUNT)
Trichomonas Wet Prep HPF POC: NEGATIVE
pH: 5

## 2011-11-23 LAB — POCT URINALYSIS DIPSTICK
Glucose, UA: NEGATIVE
Nitrite, UA: NEGATIVE
Urobilinogen, UA: NEGATIVE

## 2011-11-23 MED ORDER — METRONIDAZOLE 500 MG PO TABS: 500.0000 mg | ORAL_TABLET | Freq: Two times a day (BID) | ORAL | Status: AC

## 2011-11-23 MED ORDER — PANTOPRAZOLE SODIUM 40 MG PO TBEC: 40.0000 mg | DELAYED_RELEASE_TABLET | Freq: Every day | ORAL | Status: DC

## 2011-11-23 NOTE — Progress Notes (Signed)
Color: Yellow Odor: yes Itching:no Thin:yes Thick:no Fever:no Dyspareunia:no Hx PID:no HX STD:no Pelvic Pain:yes Desires Gc/CT:yes Desires HIV,RPR,HbsAG:yes  Vaginal Discharge/Discomfort/Itching  Subjective:   Tiffany [name] is an 36 y.o. woman who presents c/o repetitive BV infections. She suspects a reoccurrence.  Symptoms have been present for approx a week now as per patient.  Associated symptoms: local irritation.  Contraception: patient has not been using contraception and at her visit today, she has a Positive UPT. The patient thinks she os about [redacted] weeks pregnant.  History of normal Pap JulY 2012 but had BV. STD history:  Neg. Sexually transmitted infection risk: she has put herself at risk with a new partner. Menstrual flow: 25 - 28 days as per patient. Pain:  Slight irritation of labia as per patient.  Objective: Affect: Flat. Suffers with Depression. Taking medications. Lunesta, Xanax, Abilify, Cymbalta. Lungs: CTAB CV: RRR Abdomen soft NT tender all 4 quadrants. GI: Normal GU: ? BV, Postive UPT. Extremities: Normal. Wet Prep: Ph 5.0, + whiff, Clue cells Oosom: BV positive, Oosom Trch: Neg  Assessment: UPT: Positive, ? IUP 5 weeks BV.  Plan: Additional Tests:Cultures - CG, Chlamydia. Medications: Flagyl 500mg s po BID x 7 days and Protonix 40mg  po daily Counseling:  Barrie method contraception during tx and until after TOC to prevent reinfection.   Follow-up:USS for Pregnancy Viability and to have NOB interview to establish care for pregnancy.                  TOC in approx 3 weeks.  Quentin Shorey, CNM. 11/23/2011 4:56 PM

## 2011-11-24 LAB — GC/CHLAMYDIA PROBE AMP, GENITAL
Chlamydia, DNA Probe: NEGATIVE
GC Probe Amp, Genital: NEGATIVE

## 2011-12-03 ENCOUNTER — Other Ambulatory Visit: Payer: Medicaid Other

## 2011-12-03 ENCOUNTER — Other Ambulatory Visit: Payer: Self-pay | Admitting: Obstetrics and Gynecology

## 2011-12-06 ENCOUNTER — Telehealth: Payer: Self-pay | Admitting: Obstetrics and Gynecology

## 2011-12-07 ENCOUNTER — Ambulatory Visit (INDEPENDENT_AMBULATORY_CARE_PROVIDER_SITE_OTHER): Payer: Medicaid Other

## 2011-12-07 ENCOUNTER — Other Ambulatory Visit: Payer: Self-pay

## 2011-12-07 ENCOUNTER — Ambulatory Visit (INDEPENDENT_AMBULATORY_CARE_PROVIDER_SITE_OTHER): Payer: Medicaid Other | Admitting: Obstetrics and Gynecology

## 2011-12-07 DIAGNOSIS — F419 Anxiety disorder, unspecified: Secondary | ICD-10-CM

## 2011-12-07 DIAGNOSIS — O3680X0 Pregnancy with inconclusive fetal viability, not applicable or unspecified: Secondary | ICD-10-CM

## 2011-12-07 DIAGNOSIS — Z331 Pregnant state, incidental: Secondary | ICD-10-CM

## 2011-12-07 LAB — POCT URINALYSIS DIPSTICK
Glucose, UA: NEGATIVE
Ketones, UA: NEGATIVE
Spec Grav, UA: 1.02

## 2011-12-07 LAB — US OB TRANSVAGINAL

## 2011-12-07 MED ORDER — BUSPIRONE HCL 5 MG PO TABS: 7.5000 mg | ORAL_TABLET | Freq: Two times a day (BID) | ORAL | Status: DC

## 2011-12-07 NOTE — Progress Notes (Signed)
NOB interview. U/S reviewed by DD.  Viable IUP. Pt questioning if can restart Cymbalta which she has D/C'D. All medications reviewed with DD and per DD pt given following instructions. May continue Lunesta, Protonix, Adderal,. Advised not to take Abilify or Cymbalta until 2nd trimester. To D/C Xanex. DD will order Buspar instead. Pt verbalizes comprehension.

## 2011-12-08 LAB — PRENATAL PANEL VII
Antibody Screen: NEGATIVE
Eosinophils Relative: 2 % (ref 0–5)
Hemoglobin: 12.1 g/dL (ref 12.0–15.0)
Hepatitis B Surface Ag: NEGATIVE
Lymphocytes Relative: 29 % (ref 12–46)
Lymphs Abs: 2.2 10*3/uL (ref 0.7–4.0)
MCV: 86.9 fL (ref 78.0–100.0)
Monocytes Relative: 9 % (ref 3–12)
Platelets: 407 10*3/uL — ABNORMAL HIGH (ref 150–400)
RBC: 4.04 MIL/uL (ref 3.87–5.11)
Rubella: 214.1 IU/mL — ABNORMAL HIGH
WBC: 7.5 10*3/uL (ref 4.0–10.5)

## 2011-12-11 ENCOUNTER — Inpatient Hospital Stay (HOSPITAL_COMMUNITY)
Admission: AD | Admit: 2011-12-11 | Discharge: 2011-12-11 | Disposition: A | Discharge status: Home / Self Care | Payer: Medicaid Other | Source: Ambulatory Visit | Attending: Obstetrics and Gynecology | Admitting: Obstetrics and Gynecology

## 2011-12-11 ENCOUNTER — Encounter (HOSPITAL_COMMUNITY): Payer: Self-pay

## 2011-12-11 ENCOUNTER — Inpatient Hospital Stay (HOSPITAL_COMMUNITY): Payer: Medicaid Other

## 2011-12-11 ENCOUNTER — Telehealth: Payer: Self-pay | Admitting: Obstetrics and Gynecology

## 2011-12-11 DIAGNOSIS — O3410 Maternal care for benign tumor of corpus uteri, unspecified trimester: Secondary | ICD-10-CM | POA: Insufficient documentation

## 2011-12-11 DIAGNOSIS — O209 Hemorrhage in early pregnancy, unspecified: Secondary | ICD-10-CM | POA: Insufficient documentation

## 2011-12-11 DIAGNOSIS — O4690 Antepartum hemorrhage, unspecified, unspecified trimester: Secondary | ICD-10-CM

## 2011-12-11 DIAGNOSIS — D649 Anemia, unspecified: Secondary | ICD-10-CM | POA: Insufficient documentation

## 2011-12-11 DIAGNOSIS — O99019 Anemia complicating pregnancy, unspecified trimester: Secondary | ICD-10-CM | POA: Insufficient documentation

## 2011-12-11 DIAGNOSIS — D259 Leiomyoma of uterus, unspecified: Secondary | ICD-10-CM | POA: Insufficient documentation

## 2011-12-11 DIAGNOSIS — O200 Threatened abortion: Secondary | ICD-10-CM

## 2011-12-11 DIAGNOSIS — G4700 Insomnia, unspecified: Secondary | ICD-10-CM | POA: Insufficient documentation

## 2011-12-11 LAB — CBC
HCT: 30.4 % — ABNORMAL LOW (ref 36.0–46.0)
MCV: 85.4 fL (ref 78.0–100.0)
RBC: 3.56 MIL/uL — ABNORMAL LOW (ref 3.87–5.11)
WBC: 6 10*3/uL (ref 4.0–10.5)

## 2011-12-11 LAB — URINALYSIS, ROUTINE W REFLEX MICROSCOPIC
Bilirubin Urine: NEGATIVE
Hgb urine dipstick: NEGATIVE
Specific Gravity, Urine: <1.005 — ABNORMAL LOW (ref 1.005–1.030)
pH: 6 (ref 5.0–8.0)

## 2011-12-11 LAB — WET PREP, GENITAL

## 2011-12-11 MED ORDER — PROMETHAZINE HCL 25 MG PO TABS: 25.0000 mg | ORAL_TABLET | Freq: Four times a day (QID) | ORAL | Status: DC | PRN

## 2011-12-11 NOTE — MAU Provider Note (Addendum)
History     CSN: [id number]  Arrival date and time: 12/11/11 1643   First Provider Initiated Contact with Patient 12/11/11 1809      Chief Complaint  Patient presents with  . Vaginal Bleeding   HPI 36yo at 8 3/7 wks who presents with spotting earlier today.  No further bleeding since except noticed some when she wiped.  OB History    Grav Para Term Preterm Abortions TAB SAB Ect Mult Living   12 5 5  6  6   5       Past Medical History  Diagnosis Date  . Bruises easily   . IBD (inflammatory bowel disease)   . Migraine   . Cyst of ovary 2003  . H/O sickle cell trait   . Trichomonas   . Breast mass in female 2003  . Depression 2003  . DUB (dysfunctional uterine bleeding) 2003  . Menses, irregular 2003  . BV (bacterial vaginosis)   . Hx: UTI (urinary tract infection) 2003  . Low back pain 2003  . Obese 2003  . Vaginitis and vulvovaginitis 2004  . H/O amenorrhea 2006  . GERD (gastroesophageal reflux disease) 2007  . H/O menorrhagia 2011  . Yeast vaginitis 04/24/2010  . Heart murmur 2007  . Anxiety     TAKING MEDS; DR Evelene Croon  . Hypertension 2005    RESOLVED WITH WT LOSS  . Abnormal Pap smear 1997    LAST PAP 10/2010  . Arthritis   . Sacroiliitis   . Infection     OCC YEAST  . Anemia     CHRONIC  . Fibromyalgia 2004  . Fibroid     LONG TERM DX  . Blood dyscrasia     SICKLE CELL TRAIT    Past Surgical History  Procedure Date  . Cystectomy     obtain date from patient, was not on history form dated 06/16/10  . Laparoscopic gastric banding 05/20/2007  . Cervical cone biopsy 1997  . Wisdom tooth extraction   . Dilation and curettage of uterus     Family History  Problem Relation Age of Onset  . Diabetes Mother   . Arthritis Mother   . Hyperlipidemia Mother   . Other Mother     VARICOSE VEINS; DECREASED HEART RATE  . Fibromyalgia Mother   . Heart disease Father   . Hypertension Father   . Alcohol abuse Father   . Drug abuse Father   . Stroke Father     . HIV Sister   . Multiple sclerosis Brother   . Muscular dystrophy Brother   . Cancer Maternal Grandmother     Lung  . Depression Maternal Grandmother   . Miscarriages / Stillbirths Maternal Grandmother   . Cancer Paternal Grandmother     Lung  . Birth defects Maternal Aunt     EXTRA DIGITS; MUSCULAR DISEASE  . Early death Maternal Aunt 26  . Mental illness Maternal Uncle   . COPD Maternal Grandfather   . Stroke Maternal Grandfather     History  Substance Use Topics  . Smoking status: Never Smoker   . Smokeless tobacco: Never Used  . Alcohol Use: Yes     2X per week MIXED DRINK; LAST USE 11/10/11    Allergies:  Allergies  Allergen Reactions  . Flexeril (Cyclobenzaprine Hcl) Shortness Of Breath, Itching, Swelling and Rash    Swelling of throat  . Latex Shortness Of Breath, Itching and Rash  . Dust Mite Extract Other (See Comments)  Itchy eyes, runny nose  . Pollen Extract Other (See Comments)    Itchy eyes, runny nose     Prescriptions prior to admission  Medication Sig Dispense Refill  . amphetamine-dextroamphetamine (ADDERALL) 20 MG tablet Take 20 mg by mouth daily.      . busPIRone (BUSPAR) 5 MG tablet Take 1.5 tablets (7.5 mg total) by mouth 2 (two) times daily.  60 tablet  3  . Eszopiclone 3 MG TABS Take 3 mg by mouth at bedtime. Take immediately before bedtime      . pantoprazole (PROTONIX) 40 MG tablet Take 1 tablet (40 mg total) by mouth daily.  30 tablet  6    ROS No other symptoms  Physical Exam   Blood pressure 102/72, pulse 89, temperature 98.7 F (37.1 C), temperature source Oral, resp. rate 18, last menstrual period 10/13/2011.  Physical Exam Spec no active bleeding Cervix closed and long, nontender  A+ in office on PNL MAU Course  Procedures  U/s labs  Assessment and Plan  36yo at 8 3/7 wks who presents with spotting earlier today.  Currently no active bleeding and stable.  Will check wet prep, cbc, u/s and ua/ucx.  If u/s shows  viability, pt instructed with pelvic rest x 1wk and rto for scheduled appt.  Bleeding precautions discussed.  S/p course of flagyl mid August for BV.  ROBERTS,ANGELA Y 12/11/2011, 6:15 PM    S:  Pt denies any VB since Dr. Les Pou exam.  Occ'l pelvic pressure.  Some nausea and desires Rx.  C/o insomnia after stopping her "Ativan;" enc'd pt to try Phenergan or Benadryl.  O:  .[name] Filed Vitals:   12/11/11 1704  BP: 102/72  Pulse: 89  Temp: 98.7 F (37.1 C)  TempSrc: Oral  Resp: 18  .. Results for orders placed during the hospital encounter of 12/11/11 (from the past 24 hour(s))  URINALYSIS, ROUTINE W REFLEX MICROSCOPIC     Status: Abnormal   Collection Time   12/11/11  4:54 PM      Component Value Range   Color, Urine YELLOW  YELLOW   APPearance CLEAR  CLEAR   Specific Gravity, Urine <1.005 (*) 1.005 - 1.030   pH 6.0  5.0 - 8.0   Glucose, UA NEGATIVE  NEGATIVE mg/dL   Hgb urine dipstick NEGATIVE  NEGATIVE   Bilirubin Urine NEGATIVE  NEGATIVE   Ketones, ur NEGATIVE  NEGATIVE mg/dL   Protein, ur NEGATIVE  NEGATIVE mg/dL   Urobilinogen, UA 0.2  0.0 - 1.0 mg/dL   Nitrite NEGATIVE  NEGATIVE   Leukocytes, UA NEGATIVE  NEGATIVE  WET PREP, GENITAL     Status: Abnormal   Collection Time   12/11/11  6:11 PM      Component Value Range   Yeast Wet Prep HPF POC NONE SEEN  NONE SEEN   Trich, Wet Prep NONE SEEN  NONE SEEN   Clue Cells Wet Prep HPF POC NONE SEEN  NONE SEEN   WBC, Wet Prep HPF POC FEW (*) NONE SEEN  CBC     Status: Abnormal   Collection Time   12/11/11  6:26 PM      Component Value Range   WBC 6.0  4.0 - 10.5 K/uL   RBC 3.56 (*) 3.87 - 5.11 MIL/uL   Hemoglobin 10.8 (*) 12.0 - 15.0 g/dL   HCT 91.4 (*) 78.2 - 95.6 %   MCV 85.4  78.0 - 100.0 fL   MCH 30.3  26.0 - 34.0 pg  MCHC 35.5  30.0 - 36.0 g/dL   RDW 16.1  09.6 - 04.5 %   Platelets 315  150 - 400 K/uL  *RADIOLOGY REPORT*  Clinical Data: Spotting.  OBSTETRIC <14 WK ULTRASOUND  Technique: Transabdominal ultrasound was  performed for evaluation  of the gestation as well as the maternal uterus and adnexal  regions.  Comparison: None.  Intrauterine gestational sac: Visualized/normal in shape.  Yolk sac: Noted  Embryo: Noted  Cardiac Activity: Noted  Heart Rate: 169 bpm  MSD: mm w d  CRL: 2.29 cm mm 9 w 0 d [place] EDC: 07/15/2012  Maternal uterus/Adnexae:  Left ovarian simple 1.4 cm cyst incidentally noted.  Uterine fibroids largest right frontal position measuring up to 3.1  cm and left lateral position measuring up to 3.5 cm.  IMPRESSION:  Single viable intrauterine gestation of 9 weeks 0 days gestational  age with heart rate of 169.  Uterine fibroids.  Please see above.  Original Report Authenticated By: Fuller Canada, M.D.   A:  1.  [redacted]w[redacted]d      2.  Bleeding in 1st trimester      3.  A pos      4.  Fibroid uterus      5.  nml OB u/s c/w LMP      6.  Normal 1st trimester nausea      7.  Insomnia 8.  anemia P:  D/c home after u/s report rev'd; f/u prn worsening s/s or concerns      Rx for Phenergan tabs given      Iron rich diet disc'd and Floridex 2tsp qd rec'd     Rec'd pt start PNV; received samples at NOB interview    F/u 12/24/11 for NOB w/u or prn  C. Denny Levy, [place] 12/11/11, 2021

## 2011-12-11 NOTE — MAU Note (Signed)
Patient is in with c/o vaginal spotting that started today. She states that it started today. She c/o pelvic pressure. She also have had loose stool that started at 1500.

## 2011-12-11 NOTE — Telephone Encounter (Signed)
Spoke to pt who is spotting, cramping and has a lot of pressure @ 8+3 days gestation. Per Emlyn on call, pt is to report to MAU for eval. Pt is agreeable and will go now. Melody Comas A

## 2011-12-11 NOTE — Telephone Encounter (Signed)
Jackie/ob

## 2011-12-12 LAB — URINE CULTURE: Culture: NO GROWTH

## 2011-12-13 ENCOUNTER — Telehealth: Payer: Self-pay | Admitting: Obstetrics and Gynecology

## 2011-12-13 NOTE — Telephone Encounter (Signed)
TC to SL.  Reviewed message.  States is incorrect pt. Correct info received.  Is aware this pt is coming to MAU this PM.

## 2011-12-13 NOTE — Telephone Encounter (Signed)
Message copied by Mason Jim on Thu Dec 13, 2011  3:17 PM ------      Message from: Malissa Hippo.      Created: Thu Dec 13, 2011  2:50 PM      Regarding: Please call pt w quant results       Pt came to MAU for repeat quant it was 280 or so, it's down to 107      She needs to come in a week for another quant at the office      Please give her bleeding precautions       I will call her later if she wants, just send me a message back that you got a hold of her,      Thanks       SL

## 2011-12-13 NOTE — Telephone Encounter (Signed)
TC from pt. States is having increased sx of depression including outbursts of anger and crying.  Admits to homicidal thoughts but not suicidal. States spoke with her psychiatrist, Dr Sharl Ma, today and was referred to our office since is pregnant. Was seen at MAU this past weekend and continues with same spotting. Decreased pain today.  Per DD advised is  recommended eval at [facility], as mental health staff is available. Pt states prefers not to go to another ER.  Then recommended per DD, pt be evaluated at MAU.  Pt is agreeable. Contracted with pt that she will be OK until she can go after work at 5:00 and pt stated she will go for eval. Message to SL.

## 2011-12-14 ENCOUNTER — Telehealth: Payer: Self-pay

## 2011-12-14 NOTE — Telephone Encounter (Signed)
Message copied by Rolla Plate on Fri Dec 14, 2011  9:42 AM ------      Message from: Malissa Hippo.      Created: Fri Dec 14, 2011  6:45 AM      Regarding: please call pt f/u sx's depression       Pt did not come to MAU,       she is 8wks and called c/o worsening depression sx's and reported homicidal thoughts, no suicidal      She was advised to go to [facility], pt declined, advised then to go to MAU, and pt states she was going to come after work at [employer]            (see Nancy's note)      IGNORE MY NOTE ABOUT QUANT!            Please call her and see how she's doing, she needs to be seen in office or MAU ASAP      She also needs to have a visit with her psychiatrist ASAP, as well.             Please call the provider on call after you get in touch with her.            Thanks!      SL

## 2011-12-14 NOTE — Telephone Encounter (Signed)
Spoke with pt rgd f/u from phone call from yesterday pt was suppose to go to MAU for eval of depression and homicidal thoughts pt states didn't go to MAU because she didn't want to have the full work up for depression pt  States she has a history of deppresion and wants to know which meds are safe to take in pregnancy advised pt very important to go to MAU for eval pt states will go to MAU today between noon and 1 advised pt wil inform midwife on call and they will be expecting her pt voice understanding spoke with DEE CNM informed above

## 2011-12-19 DIAGNOSIS — M797 Fibromyalgia: Secondary | ICD-10-CM | POA: Insufficient documentation

## 2011-12-19 DIAGNOSIS — F32A Depression, unspecified: Secondary | ICD-10-CM | POA: Insufficient documentation

## 2011-12-19 DIAGNOSIS — F321 Major depressive disorder, single episode, moderate: Secondary | ICD-10-CM | POA: Insufficient documentation

## 2011-12-19 DIAGNOSIS — D259 Leiomyoma of uterus, unspecified: Secondary | ICD-10-CM | POA: Insufficient documentation

## 2011-12-19 DIAGNOSIS — O09529 Supervision of elderly multigravida, unspecified trimester: Secondary | ICD-10-CM | POA: Insufficient documentation

## 2011-12-19 DIAGNOSIS — M1990 Unspecified osteoarthritis, unspecified site: Secondary | ICD-10-CM | POA: Insufficient documentation

## 2011-12-19 DIAGNOSIS — M064 Inflammatory polyarthropathy: Secondary | ICD-10-CM | POA: Insufficient documentation

## 2011-12-19 DIAGNOSIS — K219 Gastro-esophageal reflux disease without esophagitis: Secondary | ICD-10-CM | POA: Insufficient documentation

## 2011-12-19 DIAGNOSIS — Z331 Pregnant state, incidental: Secondary | ICD-10-CM | POA: Insufficient documentation

## 2011-12-19 DIAGNOSIS — F329 Major depressive disorder, single episode, unspecified: Secondary | ICD-10-CM | POA: Insufficient documentation

## 2011-12-19 DIAGNOSIS — K589 Irritable bowel syndrome without diarrhea: Secondary | ICD-10-CM | POA: Insufficient documentation

## 2011-12-24 ENCOUNTER — Encounter: Payer: Self-pay | Admitting: Obstetrics and Gynecology

## 2011-12-24 ENCOUNTER — Ambulatory Visit (INDEPENDENT_AMBULATORY_CARE_PROVIDER_SITE_OTHER): Payer: Medicaid Other | Admitting: Obstetrics and Gynecology

## 2011-12-24 ENCOUNTER — Ambulatory Visit (INDEPENDENT_AMBULATORY_CARE_PROVIDER_SITE_OTHER): Payer: Medicaid Other

## 2011-12-24 VITALS — BP 108/70 | Wt 163.0 lb

## 2011-12-24 DIAGNOSIS — K589 Irritable bowel syndrome without diarrhea: Secondary | ICD-10-CM

## 2011-12-24 DIAGNOSIS — IMO0001 Reserved for inherently not codable concepts without codable children: Secondary | ICD-10-CM

## 2011-12-24 DIAGNOSIS — M797 Fibromyalgia: Secondary | ICD-10-CM

## 2011-12-24 DIAGNOSIS — O09529 Supervision of elderly multigravida, unspecified trimester: Secondary | ICD-10-CM

## 2011-12-24 DIAGNOSIS — Z1379 Encounter for other screening for genetic and chromosomal anomalies: Secondary | ICD-10-CM

## 2011-12-24 DIAGNOSIS — D259 Leiomyoma of uterus, unspecified: Secondary | ICD-10-CM

## 2011-12-24 DIAGNOSIS — M1990 Unspecified osteoarthritis, unspecified site: Secondary | ICD-10-CM

## 2011-12-24 DIAGNOSIS — Z331 Pregnant state, incidental: Secondary | ICD-10-CM

## 2011-12-24 DIAGNOSIS — M129 Arthropathy, unspecified: Secondary | ICD-10-CM

## 2011-12-24 DIAGNOSIS — Z124 Encounter for screening for malignant neoplasm of cervix: Secondary | ICD-10-CM

## 2011-12-24 DIAGNOSIS — B3731 Acute candidiasis of vulva and vagina: Secondary | ICD-10-CM

## 2011-12-24 DIAGNOSIS — F32A Depression, unspecified: Secondary | ICD-10-CM

## 2011-12-24 DIAGNOSIS — B373 Candidiasis of vulva and vagina: Secondary | ICD-10-CM

## 2011-12-24 DIAGNOSIS — F329 Major depressive disorder, single episode, unspecified: Secondary | ICD-10-CM

## 2011-12-24 DIAGNOSIS — D219 Benign neoplasm of connective and other soft tissue, unspecified: Secondary | ICD-10-CM

## 2011-12-24 DIAGNOSIS — K219 Gastro-esophageal reflux disease without esophagitis: Secondary | ICD-10-CM

## 2011-12-24 LAB — US OB LIMITED

## 2011-12-24 LAB — POCT WET PREP (WET MOUNT)

## 2011-12-24 LAB — POCT OSOM TRICHOMONAS RAPID TEST: Trichomonas vaginalis: NEGATIVE

## 2011-12-24 MED ORDER — TERCONAZOLE 0.4 % VA CREA: 1.0000 | TOPICAL_CREAM | Freq: Every day | VAGINAL | Status: DC

## 2011-12-24 NOTE — Progress Notes (Signed)
[redacted]w[redacted]d Pt wants genetic screenings.  GC/CT WNL 11/23/11 Wet prep WNL 12/11/11 Hospital visit 9/3 vag bleeding; bleeding has subsided per pt. Pt was supposed to go to MAU for suicidal thoughts on 12/14/11 but didn't go per SL.

## 2011-12-24 NOTE — Progress Notes (Signed)
Patient ID: Tiffany [name], female   DOB: 26-Dec-1975, 36 y.o.   MRN: [id number]  Tiffany [name] is here for a new obstetrical visit.  Certain of LMP 6 3/7 week [place] agrees with EDC. Reports continues to have vag bleeding. Denies cramping no vag irritation. Plans "f/o with Dr. Lafayette [name] regarding regulation of depression medications. Did not take buspar from prior visit had resumed cymbalta 4 weeks ago and abilify 2 weeks ago. No thoughts of suicide, so much better while on these medications"  [redacted]w[redacted]d  @IPILAPH @ OB History    Grav Para Term Preterm Abortions TAB SAB Ect Mult Living   12 5 5  6  6   5      Past Medical History  Diagnosis Date  . Bruises easily   . IBD (inflammatory bowel disease)   . Migraine   . Cyst of ovary 2003  . H/O sickle cell trait   . Trichomonas   . Breast mass in female 2003  . Depression 2003  . DUB (dysfunctional uterine bleeding) 2003  . Menses, irregular 2003  . BV (bacterial vaginosis)   . Hx: UTI (urinary tract infection) 2003  . Low back pain 2003  . Obese 2003  . Vaginitis and vulvovaginitis 2004  . H/O amenorrhea 2006  . GERD (gastroesophageal reflux disease) 2007  . H/O menorrhagia 2011  . Yeast vaginitis 04/24/2010  . Heart murmur 2007  . Anxiety     TAKING MEDS; DR Tiffany [name]  . Hypertension 2005    RESOLVED WITH WT LOSS  . Abnormal Pap smear 1997    LAST PAP 10/2010  . Arthritis   . Sacroiliitis   . Infection     OCC YEAST  . Anemia     CHRONIC  . Fibromyalgia 2004  . Fibroid     LONG TERM DX  . Blood dyscrasia     SICKLE CELL TRAIT  AMA Lost 120 pounds after lap band surgery Past Surgical History  Procedure Date  . Cystectomy     obtain date from patient, was not on history form dated 06/16/10  . Laparoscopic gastric banding 05/20/2007  . Cervical cone biopsy 1997  . Wisdom tooth extraction   . Dilation and curettage of uterus   thigh and breast reductions bilaterally Family History: family history includes Alcohol abuse in her father;  Arthritis in her mother; Birth defects in her maternal aunt; COPD in her maternal grandfather; Cancer in her maternal grandmother and paternal grandmother; Depression in her maternal grandmother; Diabetes in her mother; Drug abuse in her father; Early death (age of onset:11) in her maternal aunt; Fibromyalgia in her mother; HIV in her sister; Heart disease in her father; Hyperlipidemia in her mother; Hypertension in her father; Mental illness in her maternal uncle; Miscarriages / Stillbirths in her maternal grandmother; Multiple sclerosis in her brother; Muscular dystrophy in her brother; Other in her mother; and Stroke in her father and maternal grandfather. Social History:  reports that she has never smoked. She has never used smokeless tobacco. She reports that she drinks alcohol. She reports that she does not use illicit drugs.    Blood pressure 108/70, weight 163 lb (73.936 kg), last menstrual period 10/13/2011.  Physical exam: Calm, no distress Alert, appropriate appearance for age. No acute distress HEENT Grossly normal. Breasts bilaterally no masses, dimpling, or drainage. With surgical scar at 6 o'clock bilaterally. Thyroid no masses, nodes or enlargement  lungs clear bilaterally, AP RRR,abd soft, gravid, nt, bowel sounds active No edema  to lower extremities. Thighs with surgical scar bilaterally. EGBUS and perineum wnl, normal hair distribution Vagina pink moist Cervix LTC no cerical motion tenderness appears adequate Fundal heightb20 FHTS +  Prenatal labs: ABO, Rh: A/POS/-- (08/30 1208) Antibody: NEG (08/30 1208) Rubella:  immune RPR: NON REAC (08/30 1208)  HBsAg: NEGATIVE (08/30 1208)  HIV: NON REACTIVE (08/30 1208)  GBS:   na  Assessment/Plan: [redacted]w[redacted]d Gc/chl neg.neg Wet prep: neg trich positive hyphae, clue neg Pap: sent [place]: today with subchorionic hemorrhage, fibroids as noted previously, +FHTS Genetic screen: [facility]  Plans f/o with Dr. Lafayette [name] regarding regulation of  medications. Discussed f/o E if thoughts of suicide di Collaboration with Dr. Pennie [name]. [facility], Tiffany [name] 12/24/2011, 5:39 PM Tiffany [name], CNM

## 2011-12-25 LAB — PAP IG W/ RFLX HPV ASCU

## 2012-01-03 ENCOUNTER — Other Ambulatory Visit: Payer: Self-pay

## 2012-01-03 DIAGNOSIS — F419 Anxiety disorder, unspecified: Secondary | ICD-10-CM

## 2012-01-03 DIAGNOSIS — F322 Major depressive disorder, single episode, severe without psychotic features: Secondary | ICD-10-CM

## 2012-01-03 LAB — HARMONY PRENATAL TEST: FETAL CFDNA PERCENTAGE: 9.2

## 2012-01-07 ENCOUNTER — Ambulatory Visit (INDEPENDENT_AMBULATORY_CARE_PROVIDER_SITE_OTHER): Payer: Medicaid Other | Admitting: Obstetrics and Gynecology

## 2012-01-07 ENCOUNTER — Ambulatory Visit (INDEPENDENT_AMBULATORY_CARE_PROVIDER_SITE_OTHER): Payer: Medicaid Other

## 2012-01-07 VITALS — BP 110/72 | Wt 161.0 lb

## 2012-01-07 DIAGNOSIS — O209 Hemorrhage in early pregnancy, unspecified: Secondary | ICD-10-CM

## 2012-01-07 DIAGNOSIS — Z331 Pregnant state, incidental: Secondary | ICD-10-CM

## 2012-01-07 DIAGNOSIS — Z3490 Encounter for supervision of normal pregnancy, unspecified, unspecified trimester: Secondary | ICD-10-CM

## 2012-01-07 LAB — US OB LIMITED

## 2012-01-07 NOTE — Progress Notes (Signed)
[redacted]w[redacted]d Hx of SCH USS today to evaluate as still has some light PV bleeding with no cramping Result: 13w 3d, Normal adnexa, Fibroids noted, [facility] collection = 1.6 x 0.31 x 1.2 cm Resolving ROB x 4 weeks

## 2012-01-07 NOTE — Progress Notes (Signed)
Pt stated still bleeding can fill up a pad when bleeding.pt stated no other issues today.

## 2012-01-09 ENCOUNTER — Other Ambulatory Visit: Payer: Self-pay | Admitting: Obstetrics and Gynecology

## 2012-01-09 DIAGNOSIS — O209 Hemorrhage in early pregnancy, unspecified: Secondary | ICD-10-CM

## 2012-01-28 ENCOUNTER — Telehealth: Payer: Self-pay | Admitting: Obstetrics and Gynecology

## 2012-01-29 ENCOUNTER — Encounter: Payer: Self-pay | Admitting: Obstetrics and Gynecology

## 2012-01-29 ENCOUNTER — Ambulatory Visit (INDEPENDENT_AMBULATORY_CARE_PROVIDER_SITE_OTHER): Payer: Medicaid Other | Admitting: Obstetrics and Gynecology

## 2012-01-29 VITALS — BP 110/70 | Wt 160.0 lb

## 2012-01-29 DIAGNOSIS — K0889 Other specified disorders of teeth and supporting structures: Secondary | ICD-10-CM | POA: Insufficient documentation

## 2012-01-29 DIAGNOSIS — Z9884 Bariatric surgery status: Secondary | ICD-10-CM | POA: Insufficient documentation

## 2012-01-29 NOTE — Progress Notes (Signed)
[redacted]w[redacted]d  Pt states she was told she would have a U/S done. U/S was not scheduled. Pt unable to leave urine sample at this time she will leave a sample before leaving the office.

## 2012-01-29 NOTE — Progress Notes (Signed)
Denies SI/HI ideation. Per High Risk meeting, concur with MFM referral. Follow [place] for growth q 4 weeks from 24 weeks due to hx gastric banding.

## 2012-01-29 NOTE — Progress Notes (Signed)
Still has struggles with depression.  Met with Dr. Evelene Croon, but patient is largely determining med use (1/2 doses, intermittent use, plans to stop meds in 3rd trimester).  I am concerned about all that--recommended MFM referral to discuss and to have 18 week [place] for anatomy due to [facility] and med exposure 1st trimester.  Patient agreeable with plan. Will decide if wants AFP at MFM. Still has sporadic episodes of bleeding--good FHR noted. No bleeding today. To call if has any worsening of bleeding or depression.

## 2012-01-30 ENCOUNTER — Other Ambulatory Visit: Payer: Self-pay | Admitting: Obstetrics and Gynecology

## 2012-01-30 ENCOUNTER — Encounter (HOSPITAL_COMMUNITY): Payer: Self-pay | Admitting: Obstetrics and Gynecology

## 2012-01-30 ENCOUNTER — Telehealth: Payer: Self-pay | Admitting: Obstetrics and Gynecology

## 2012-01-30 DIAGNOSIS — Z3689 Encounter for other specified antenatal screening: Secondary | ICD-10-CM

## 2012-01-30 DIAGNOSIS — IMO0002 Reserved for concepts with insufficient information to code with codable children: Secondary | ICD-10-CM

## 2012-01-30 NOTE — Telephone Encounter (Signed)
MFM appt scheduled 02/12/12 at 9:15.  TC to pt. Informed of appt. Pt verbalizes comprehension.

## 2012-01-31 ENCOUNTER — Encounter: Payer: Medicaid Other | Admitting: Obstetrics and Gynecology

## 2012-02-04 ENCOUNTER — Encounter: Payer: Medicaid Other | Admitting: Obstetrics and Gynecology

## 2012-02-05 ENCOUNTER — Encounter (HOSPITAL_COMMUNITY): Payer: Self-pay | Admitting: Obstetrics and Gynecology

## 2012-02-12 ENCOUNTER — Ambulatory Visit (HOSPITAL_COMMUNITY)
Admission: RE | Admit: 2012-02-12 | Discharge: 2012-02-12 | Disposition: A | Discharge status: Home / Self Care | Payer: Medicaid Other | Source: Ambulatory Visit | Attending: Obstetrics and Gynecology | Admitting: Obstetrics and Gynecology

## 2012-02-12 ENCOUNTER — Encounter (HOSPITAL_COMMUNITY): Payer: Self-pay

## 2012-02-12 ENCOUNTER — Other Ambulatory Visit: Payer: Self-pay | Admitting: Obstetrics and Gynecology

## 2012-02-12 VITALS — BP 120/78 | HR 98 | Wt 160.5 lb

## 2012-02-12 DIAGNOSIS — Z363 Encounter for antenatal screening for malformations: Secondary | ICD-10-CM | POA: Insufficient documentation

## 2012-02-12 DIAGNOSIS — K0889 Other specified disorders of teeth and supporting structures: Secondary | ICD-10-CM

## 2012-02-12 DIAGNOSIS — Z1389 Encounter for screening for other disorder: Secondary | ICD-10-CM | POA: Insufficient documentation

## 2012-02-12 DIAGNOSIS — O99340 Other mental disorders complicating pregnancy, unspecified trimester: Secondary | ICD-10-CM | POA: Insufficient documentation

## 2012-02-12 DIAGNOSIS — O09899 Supervision of other high risk pregnancies, unspecified trimester: Secondary | ICD-10-CM | POA: Insufficient documentation

## 2012-02-12 DIAGNOSIS — O358XX0 Maternal care for other (suspected) fetal abnormality and damage, not applicable or unspecified: Secondary | ICD-10-CM | POA: Insufficient documentation

## 2012-02-12 DIAGNOSIS — Z9884 Bariatric surgery status: Secondary | ICD-10-CM

## 2012-02-12 DIAGNOSIS — O09529 Supervision of elderly multigravida, unspecified trimester: Secondary | ICD-10-CM

## 2012-02-12 DIAGNOSIS — D219 Benign neoplasm of connective and other soft tissue, unspecified: Secondary | ICD-10-CM

## 2012-02-12 DIAGNOSIS — F1910 Other psychoactive substance abuse, uncomplicated: Secondary | ICD-10-CM | POA: Insufficient documentation

## 2012-02-12 DIAGNOSIS — IMO0002 Reserved for concepts with insufficient information to code with codable children: Secondary | ICD-10-CM

## 2012-02-12 DIAGNOSIS — F329 Major depressive disorder, single episode, unspecified: Secondary | ICD-10-CM

## 2012-02-12 DIAGNOSIS — Z3689 Encounter for other specified antenatal screening: Secondary | ICD-10-CM

## 2012-02-12 NOTE — Progress Notes (Signed)
MATERNAL FETAL MEDICINE CONSULT  Patient Name: Tiffany [name] Medical Record Number:  [id number] Date of Birth: 06/10/1975 Requesting Physician Name:  Nigel Bridgeman, CNM Date of Service: 02/12/2012  Chief Complaint Advanced maternal age, depression, history of gastric band surgery  History of Present Illness Tiffany [name] was seen today for prenatal diagnosis secondary to Advanced maternal age, depression, history of gastric band surgery at the request of Nigel Bridgeman, CNM .  The patient is a 36 y.o. [id number] with an EDD of 07/15/2012, by Ultrasound dating method.  She previously had a negative cell free fetal DNA result.  She reports having difficulties in managing her depression and is currently taking abilify, buspar, cymbalta, xanax 1-2 mg/day, and lunesta.  She reports her gastric band surgery was in 2009 and she is currently in maintenance.  She reports a 7# weight loss this pregnancy due to nausea and vomiting.  She reports her nausea and vomiting is controlled well by phenergan and that she is able to tolerate po at times. She denies any vaginal bleeding, cramping or loss of fluid.   Review of Systems Pertinent items are noted in HPI.  Patient History OB History    Grav Para Term Preterm Abortions TAB SAB Ect Mult Living   12 5 5  6  6   5      # Outc Date GA Lbr Len/2nd Wgt Sex Del Anes PTL Lv   1 TRM 1990 [redacted]w[redacted]d 01:00 8lb4oz(3.742kg) M SVD None  Yes   2 SAB 5/92    U    No   3 SAB 12/92    U    No   4 SAB 1995    U    No   5 TRM 1996 [redacted]w[redacted]d 02:30 7lb(3.175kg) M SVD EPI  Yes   6 SAB 1997    U    No   7 TRM 2002 [redacted]w[redacted]d 05:00 7lb9oz(3.43kg) M SVD EPI  Yes   8 TRM 2003 [redacted]w[redacted]d 02:30 6lb13oz(3.09kg) M SVD None  Yes   9 SAB 2004    U    No   10 TRM 6/05 [redacted]w[redacted]d  6lb10.7oz(3.025kg) F SVD None  Yes   11 SAB 2012 [redacted]w[redacted]d          12 CUR              Past Medical History  Diagnosis Date  . Bruises easily   . IBD (inflammatory bowel disease)   . Migraine   . Cyst of ovary 2003  . H/O  sickle cell trait   . Trichomonas   . Breast mass in female 2003  . Depression 2003  . DUB (dysfunctional uterine bleeding) 2003  . Menses, irregular 2003  . BV (bacterial vaginosis)   . Hx: UTI (urinary tract infection) 2003  . Low back pain 2003  . Obese 2003  . Vaginitis and vulvovaginitis 2004  . H/O amenorrhea 2006  . GERD (gastroesophageal reflux disease) 2007  . H/O menorrhagia 2011  . Yeast vaginitis 04/24/2010  . Heart murmur 2007  . Anxiety     TAKING MEDS; DR Evelene Croon  . Hypertension 2005    RESOLVED WITH WT LOSS  . Abnormal Pap smear 1997    LAST PAP 10/2010  . Arthritis   . Sacroiliitis   . Infection     OCC YEAST  . Anemia     CHRONIC  . Fibromyalgia 2004  . Fibroid     LONG TERM DX  . Blood dyscrasia  SICKLE CELL TRAIT   Past Surgical History  Procedure Date  . Cystectomy     obtain date from patient, was not on history form dated 06/16/10  . Laparoscopic gastric banding 05/20/2007  . Cervical cone biopsy 1997  . Wisdom tooth extraction   . Dilation and curettage of uterus     History   Social History  . Marital Status: Single    Spouse Name: N/A    Number of Children: 5  . Years of Education: 16   Occupational History  . CLINICAL RESEARCH    Social History Main Topics  . Smoking status: Never Smoker   . Smokeless tobacco: Never Used  . Alcohol Use: Yes     Comment: 2X per week MIXED DRINK; LAST USE 11/10/11  . Drug Use: No  . Sexually Active: Yes -- Female partner(s)    Birth Control/ Protection: None   Other Topics Concern  . Not on file   Social History Narrative  . No narrative on file   Family History  Problem Relation Age of Onset  . Diabetes Mother   . Arthritis Mother   . Hyperlipidemia Mother   . Other Mother     VARICOSE VEINS; DECREASED HEART RATE  . Fibromyalgia Mother   . Heart disease Father   . Hypertension Father   . Alcohol abuse Father   . Drug abuse Father   . Stroke Father   . HIV Sister   . Multiple  sclerosis Brother   . Muscular dystrophy Brother   . Cancer Maternal Grandmother     Lung  . Depression Maternal Grandmother   . Miscarriages / Stillbirths Maternal Grandmother   . Cancer Paternal Grandmother     Lung  . Birth defects Maternal Aunt     EXTRA DIGITS; MUSCULAR DISEASE  . Early death Maternal Aunt 6  . Mental illness Maternal Uncle   . COPD Maternal Grandfather   . Stroke Maternal Grandfather     In addition, the patient has an aunt who died at age 13 from a "chromsomal disorder". Physical Examination BP 120/78 P 98 Weight 160 General appearance - alert, well appearing, and in no distress  Assessment and Recommendations 36 y.o. [id number] @ [redacted]w[redacted]d with  1. Advanced maternal age- patient is to meet with genetic counseling today.  Please see their note for full details.  She did decide during their visit that she would like an amniocentesis despite her negative cell free fetal DNA due to a family history of an aunt with a "chromosomal disorder".  Consider twice weekly testing at term due to an increase in stillbirth in advanced maternal age.  Other risks of advanced maternal age were discussed, including but not limited to: increased risk for gestational diabetes, hypertensive disorders of pregnancy, fetal anomalies, and preterm delivery, and need for cesarean delivery. 2. Gastric band history- As gastric banding does not remove a portion of the GI tract it is unlikely to encounter severe vitamin deficiencies that would affect the fetus.  Continue to monitor maternal weight gain.  Advised patient to eat small frequent meals to help with not only digestion and absorption but also with her nausea and vomiting. I recommend that she follows up with her bariatric surgeon to discuss her pregnancy and see if they have any change in their recommendations.  3. Depression- the patient is on several psychiatric medications.  Buspar is category B.  Abilify, cymbalta and lunesta are Category C.   Xanax is category D.  The implications to this was discussed in great detail with the patient.  We discussed the implications of substance use and exposure to the fetus. Risks of substance use during pregnancy include but are not limited to: fetal withdrawal syndromes, need for NICU care, preterm delivery, PPROM, and risk of abruption. Risks of benzodiazepine use in pregnancy and possible fetal effects were discussed with patient. Delivery at a facility with pediatricians familiar with neonatal withdrawl will be needed.  It is advised that Ms. Raphael meet with her prescribing psychiatrist to discuss which medications are necessary for her mental health well being.   The majority of episodes of depressive disorder during pregnancy tend to increase and are more likely to recur with subsequent pregnancies. There also is an increased risk of postpartum psychosis as high as 46%. The risk-benefit ratio of continuing medication for control of psychiatric disorders should be weighed against the potential and/or theoretical risk to the fetus. As maternal psychiatric illness, if inadequately treated or untreated, may result in poor compliance with prenatal care, inadequate nutrition, exposure to additional medication or herbal remedies, increased alcohol and tobacco use, deficits in mother-infant bonding, and disruptions within the family environment. As each case should be individualized, she was counseled that there is little data on the safety of abilify, cymbalta, lunesta and use in pregnancy, however, the benefit from continuing on a regimen that provides her stable psychiatric control may, in this case, outweigh the potential (unknown) theoretical risk to the fetus. She was advised as such. She was encouraged to continue her preconceptual care and psychiatric care with her providers.   Thank you for involving me in the care of your patient.  Please contact our office for any additional questions.   40 minutes was  spent with this patient >50% of which was face to face.   Molly Maduro, MD

## 2012-02-13 NOTE — Progress Notes (Signed)
Genetic Counseling  High-Risk Gestation Note  Appointment Date:  02/13/2012 Referred By: Nigel Bridgeman, CNM Date of Birth:  02/25/1976    Pregnancy History: [id number] Estimated Date of Delivery: 07/15/12 Estimated Gestational Age: [redacted]w[redacted]d    [name], was seen for genetic counseling regarding a maternal age of 36 y.o..  She was counseled regarding maternal age and the association with risk for chromosome conditions due to nondisjunction with aging of the ova.   We reviewed chromosomes, nondisjunction, and the associated 1 in 80 risk for fetal aneuploidy related to a maternal age of 36 y.o. at [redacted]w[redacted]d weeks gestation.  She was counseled that the risk for aneuploidy decreases as gestational age increases, accounting for those pregnancies which spontaneously abort.  We specifically discussed Down syndrome (trisomy 8), trisomies 59 and 75, and sex chromosome aneuploidies (47,XXX and 47,XXY) including the common features and prognoses of each.   We also reviewed Tiffany [name]'s noninvasive prenatal testing (NIPT) result and the associated reduction in risks for fetal Down syndrome, trisomy 47, and trisomy 25. We reviewed that NIPT analyzes cell free fetal DNA found in the maternal circulation. This test is not diagnostic for chromosome conditions, but can provide information regarding the presence or absence of extra fetal DNA for chromosomes 13, 18 and 21. Thus, it would not identify or rule out all fetal aneuploidy. The reported detection rate is greater than 99% for Trisomy 21, greater than 97% for Trisomy 18, and is approximately 80% (8 out of 10) for Trisomy 13. The false positive rate is reported to be less than 0.1% for any of these conditions.  We reviewed that these results are within normal limits, showing a less than 1 in 10,000 risk for trisomies 21, 18 and 13.  In addition, we discussed that ~50-80% of fetuses with Down syndrome and up to 90-95% of fetuses with trisomy 18/13, when  well visualized, have detectable anomalies or soft markers by detailed ultrasound (~18+ weeks gestation). Targeted ultrasound was performed at the time of today's visit. Visualized fetal anatomy appeared normal. Complete ultrasound results reported separately. [name] was also counseled regarding the option of diagnostic testing via amniocentesis. We reviewed the approximate 1 in 300-500 risk for complications, including spontaneous pregnancy loss. After consideration of all the options, [name] elected to proceed with amniocentesis in pregnancy, which was scheduled for 02/20/12.   Both family histories were reviewed and found to be contributory for a maternal aunt with a "chromosome disorder." This maternal aunt reportedly died at age 29 years. Very limited information was known regarding this relative. No information was known regarding the specific condition or how she was diagnosed with a chromosome condition. The patient reported that no pictures are known to exist of her, so she is not able to comment on possible physical features that may suggest a particular syndrome. We discussed that without more information regarding the specific diagnosis, accurate recurrence risk for relatives cannot be assessed. We also discussed that in the absence of confirmation of this aunt's condition, we cannot assess if available prenatal screening and testing options would accurately assess for this condition. We reviewed that chromosome disorders can occur sporadically or be inherited through families. We discussed that it is also possible that this aunt had a different underlying condition or syndrome that was not caused by a chromosome difference. The patient stated that she is interested in chromosome analysis via amniocentesis in the current pregnancy given this family history. We discussed that  without medical documentation regarding the type of chromosome disorder, amniocentesis would not necessarily  diagnose or rule out the condition present in her aunt given that amniocentesis may not necessarily detect smaller duplications or deletions. The patient understands that amniocentesis typically does not diagnose or rule out single gene conditions. We reviewed that there are also birth defects or conditions with mental delays that do not have an identified genetic basis, in which case, amniocentesis would not necessarily be informative.   Additionally, the patient reported another maternal aunt who died at age 9 or 69 years due to rickets. Very limited information was known regarding this relative. Rickets, due to vitamin D deficiency, can be a result of primary vitamin D deficiency or secondary to a medical condition that leads to poor absorption of vitamin D. Less commonly, there can be inherited forms of rickets. No additional relatives were reported with rickets. Thus, recurrence risk for the current pregnancy would likely be low. However, additional information is needed regarding this relative's condition and the underlying etiology to accurately assess recurrence risk for relatives.   Additionally,  [name] reported that she has sickle cell trait, and all of her children, except for her 69 year old, also have sickle cell trait. We discussed that sickle cell anemia (SCA) affects the shape and function of the red blood cell by producing abnormal hemoglobin. She was counseled that hemoglobin is a protein in the RBCs that carries oxygen to the body's organs. Individuals who have SCA have a changes within the genes that codes for hemoglobin. We reviewed the autosomal recessive inheritance and the 25% chance for offspring to inherit SCA when both parents carry a hemoglobin variant, such as sickle cell trait. A carrier of SCA has one altered copy of the gene for hemoglobin and one typical working copy (Hemoglobin AS), also called sickle cell trait. Carriers of recessive conditions typically do not  have symptoms related to the condition or may have mild symptoms because they still have one functioning copy of the gene, and thus some production of the typical protein coded for by that gene.  Given the recessive inheritance, we discussed the importance of understanding the father of the pregnancy's carrier status in order to accurately predict the risk of a hemoglobinopathy in the fetus. We discussed that prenatal diagnosis for SCA is available via amniocentesis when both parents carrier status are known. If the father of the pregnancy has not had screening, his chance to have sickle cell trait is the general population chance of approximately 1 in 12, given no known family history. Thus, prior to carrier screening for him, the chance for SCA in the current pregnancy would be approximately 1 in 48. If he is not a carrier of a hemoglobin variant, then the pregnancy is not at risk for hemoglobinopathy but would have a 1 in 2 (50%) chance to have sickle cell trait. Screening for the father of the pregnancy is available via hemoglobin electrophoresis with quantitative A2, if not previously performed. We also reviewed the availability of newborn screening in [place] for hemoglobinopathies.   Additionally, the patient reported that the father of the pregnancy has an 30 year old son, with a previous partner, who has autism. The patient also reported a 2 year-old nephew, her brother's son, with autism. An underlying cause for autism has not been identified for either individual. We discussed that autism is part of the spectrum of conditions referred to as Autistic spectrum disorders (ASD). We discussed that  ASDs are among the most common neurodevelopmental disorders, with approximately 1 in 88 children meeting criteria for ASD. Approximately 80% of individuals diagnosed are female. There is strong evidence that genetic factors play a critical role in development of ASD. There have been recent advances in  identifying specific genetic causes of ASD, however, there are still many individuals for whom the etiology of the ASD is not known. Once a family has a child with a diagnosis of ASD, there is a 13.5% chance to have another child with ASD. If the pregnancy is female the chance is approximately 9%, and approximately 26% if the pregnancy is female. When there is more than one affected sibling, the recurrence chance is 32%. Recurrence risk estimates are limited for extended degree relatives. Recurrence risk for the current pregnancy would be expected to be increased above the general population risk but less than 13.5% in the case of multifactorial inheritance, given that the current pregnancy is a half-sibling to the affected relative. They understand that at this time there is not genetic testing available for ASD for most families. Without further information regarding the provided family history, an accurate genetic risk cannot be calculated. Further genetic counseling is warranted if more information is obtained.  [name] denied exposure to environmental toxins or chemical agents. She denied the use of alcohol, tobacco or street drugs. She denied significant viral illnesses during the course of her pregnancy. Her medical and surgical histories were contributory for depression, for which she takes abilify, buspar, xanax, cymbalta, and lunesta. [name] was also seen for Maternal Fetal Medicine consultation at the time of today's visit regarding this history. Please see separate MFM consult note for detailed discussion regarding associations with medication use in pregnancy and pregnancy management.   I counseled Tiffany [name] regarding the above risks and available options.  The approximate face-to-face time with the genetic counselor was 45 minutes.  Quinn Plowman, MS Certified Genetic Counselor 02/18/2012

## 2012-02-14 ENCOUNTER — Ambulatory Visit (INDEPENDENT_AMBULATORY_CARE_PROVIDER_SITE_OTHER): Payer: Medicaid Other | Admitting: Physician Assistant

## 2012-02-14 ENCOUNTER — Encounter (INDEPENDENT_AMBULATORY_CARE_PROVIDER_SITE_OTHER): Payer: Self-pay

## 2012-02-14 VITALS — BP 120/84 | HR 116 | Ht 65.0 in | Wt 158.8 lb

## 2012-02-14 DIAGNOSIS — Z4651 Encounter for fitting and adjustment of gastric lap band: Secondary | ICD-10-CM

## 2012-02-14 DIAGNOSIS — K429 Umbilical hernia without obstruction or gangrene: Secondary | ICD-10-CM

## 2012-02-14 NOTE — Patient Instructions (Signed)
Return in one month to see Dr. Johna Sheriff. Return sooner for persistent vomiting, abdominal pain, tenderness or pain to your belly button, abdominal bloating or inability to pass gas.

## 2012-02-14 NOTE — Progress Notes (Signed)
  HISTORY: Tiffany [name] is a 36 y.o.female who received an AP-Standard lap-band in February 2009 by Dr. Johna Sheriff. She comes in 4 months pregnant requesting some fluid be taken out. She's having daily vomiting as a result of morning sickness. She denies obstructive symptoms. She and her mother both state that she's lost weight with previous pregnancies. She also has noticed a bulging at her umbilicus that doesn't cause pain.  VITAL SIGNS: Filed Vitals:   02/14/12 1138  BP: 120/84  Pulse: 116    PHYSICAL EXAM: Physical exam reveals a very well-appearing 36 y.o.female in no apparent distress Neurologic: Awake, alert, oriented Psych: Bright affect, conversant Respiratory: Breathing even and unlabored. No stridor or wheezing Abdomen: Soft, nontender, nondistended to palpation. Incisions well-healed. No incisional hernias. 2 cm umbilical hernia without tenderness. No bowel palpated. Port easily palpated. Extremities: Atraumatic, good range of motion.  ASSESMENT: 36 y.o.  female  s/p AP-Standard lap-band.   PLAN: The patient's port was accessed with a 20G Huber needle without difficulty. 0.5 mL fluid was removed. I strongly recommended removal of all fluid secondary to her pregnancy (as did her mother who was present for the appointment) but she refused on several occasions. Her case was discussed with Dr. Johna Sheriff. He would like to see her in one month. Strict return criteria were given including persistent vomiting, upper GI (and intestinal) obstructive symptoms including abdominal bloating, pain, tenderness or persistent pain about the umbilicus, constipation or inability to pass flatus. She voiced understanding and agreement.

## 2012-02-20 ENCOUNTER — Other Ambulatory Visit: Payer: Self-pay

## 2012-02-20 ENCOUNTER — Ambulatory Visit (HOSPITAL_COMMUNITY)
Admission: RE | Admit: 2012-02-20 | Discharge: 2012-02-20 | Disposition: A | Discharge status: Home / Self Care | Payer: Medicaid Other | Source: Ambulatory Visit | Attending: Obstetrics and Gynecology | Admitting: Obstetrics and Gynecology

## 2012-02-20 VITALS — BP 117/81 | HR 108 | Wt 172.5 lb

## 2012-02-20 DIAGNOSIS — O99840 Bariatric surgery status complicating pregnancy, unspecified trimester: Secondary | ICD-10-CM | POA: Insufficient documentation

## 2012-02-20 DIAGNOSIS — F1910 Other psychoactive substance abuse, uncomplicated: Secondary | ICD-10-CM | POA: Insufficient documentation

## 2012-02-20 DIAGNOSIS — K0889 Other specified disorders of teeth and supporting structures: Secondary | ICD-10-CM

## 2012-02-20 DIAGNOSIS — O99340 Other mental disorders complicating pregnancy, unspecified trimester: Secondary | ICD-10-CM | POA: Insufficient documentation

## 2012-02-20 DIAGNOSIS — IMO0002 Reserved for concepts with insufficient information to code with codable children: Secondary | ICD-10-CM

## 2012-02-20 DIAGNOSIS — O09899 Supervision of other high risk pregnancies, unspecified trimester: Secondary | ICD-10-CM | POA: Insufficient documentation

## 2012-02-20 DIAGNOSIS — Z9884 Bariatric surgery status: Secondary | ICD-10-CM

## 2012-02-20 DIAGNOSIS — O09529 Supervision of elderly multigravida, unspecified trimester: Secondary | ICD-10-CM | POA: Insufficient documentation

## 2012-02-20 LAB — AP-AFP (ALPHA FETOPROTEIN)

## 2012-02-20 NOTE — Progress Notes (Signed)
Tiffany [name]  was seen today for an ultrasound appointment.  See full report in [facility].  Single IUP at 19 1/7 weeks Amniocentesis performed as documented above without complications  Uterine fibroids noted (as above)  Follow up ultrasound at approx [redacted] weeks gestation to reevaluate placental location.  Alpha Gula, MD

## 2012-02-20 NOTE — Addendum Note (Signed)
Encounter addended by: Quinn Plowman on: 02/20/2012 12:40 PM<BR>     Documentation filed: Notes Section

## 2012-02-20 NOTE — Progress Notes (Signed)
I spoke to the patient about the [facility] amniotic fluid collection research study (IRB# [id number]). The study was explained to the patient and any questions were answered. The patient gave her consent to take part, signed the consent form and copy of the signed form was given to her. The patient's blood was drawn and amniotic fluid collected per the research protocol.  Clydie Braun Barba Solt 02/20/2012

## 2012-02-25 ENCOUNTER — Ambulatory Visit (INDEPENDENT_AMBULATORY_CARE_PROVIDER_SITE_OTHER): Payer: Medicaid Other | Admitting: Obstetrics and Gynecology

## 2012-02-25 ENCOUNTER — Encounter: Payer: Self-pay | Admitting: Obstetrics and Gynecology

## 2012-02-25 VITALS — BP 120/70 | Wt 180.0 lb

## 2012-02-25 DIAGNOSIS — Z331 Pregnant state, incidental: Secondary | ICD-10-CM

## 2012-02-25 DIAGNOSIS — K219 Gastro-esophageal reflux disease without esophagitis: Secondary | ICD-10-CM

## 2012-02-25 DIAGNOSIS — D219 Benign neoplasm of connective and other soft tissue, unspecified: Secondary | ICD-10-CM

## 2012-02-25 DIAGNOSIS — O09529 Supervision of elderly multigravida, unspecified trimester: Secondary | ICD-10-CM

## 2012-02-25 DIAGNOSIS — Z9884 Bariatric surgery status: Secondary | ICD-10-CM

## 2012-02-25 DIAGNOSIS — D259 Leiomyoma of uterus, unspecified: Secondary | ICD-10-CM

## 2012-02-25 NOTE — Progress Notes (Signed)
[redacted]w[redacted]d Pt c/o increased swelling in legs, ankles, and legs x 3 days. No HA's, dizziness, or visual changes. Pt concerned about 20 lb weight gain since 01/29/12. Pt with increased appetite.  Rec: ASAP 1 hr glucola  Watch salt in diet  Support hose Amnio results pending

## 2012-02-26 ENCOUNTER — Other Ambulatory Visit: Payer: Medicaid Other

## 2012-02-26 DIAGNOSIS — Z331 Pregnant state, incidental: Secondary | ICD-10-CM

## 2012-02-27 ENCOUNTER — Telehealth: Payer: Self-pay | Admitting: Obstetrics and Gynecology

## 2012-02-27 LAB — GLUCOSE TOLERANCE, 1 HOUR (50G) W/O FASTING: Glucose, 1 Hour GTT: 99 mg/dL (ref 70–140)

## 2012-02-28 NOTE — Telephone Encounter (Signed)
Tc to pt regarding msg.  Pt informed one hour glucola was 99, WNL.  Pt voices agreement.

## 2012-02-29 ENCOUNTER — Telehealth (HOSPITAL_COMMUNITY): Payer: Self-pay | Admitting: MS"

## 2012-02-29 NOTE — Telephone Encounter (Signed)
Called Tiffany [name] to discuss the final karyotype result from her amniocentesis We reviewed that these are within normal limits (46, XX).  Patient reported that she has not noticed any concerning symptoms or complications following the procedure.  All questions were answered to her satisfaction, she was encouraged to call with additional questions or concerns.  Quinn Plowman  MS Certified Genetic Counselor 02/29/2012 2:52 PM

## 2012-03-13 ENCOUNTER — Encounter (HOSPITAL_COMMUNITY): Payer: Self-pay | Admitting: *Deleted

## 2012-03-13 ENCOUNTER — Telehealth: Payer: Self-pay | Admitting: Obstetrics and Gynecology

## 2012-03-13 ENCOUNTER — Inpatient Hospital Stay (HOSPITAL_COMMUNITY)
Admission: AD | Admit: 2012-03-13 | Discharge: 2012-03-13 | Disposition: A | Discharge status: Home / Self Care | Payer: Medicaid Other | Source: Ambulatory Visit | Attending: Obstetrics and Gynecology | Admitting: Obstetrics and Gynecology

## 2012-03-13 DIAGNOSIS — O4700 False labor before 37 completed weeks of gestation, unspecified trimester: Secondary | ICD-10-CM | POA: Insufficient documentation

## 2012-03-13 DIAGNOSIS — R109 Unspecified abdominal pain: Secondary | ICD-10-CM | POA: Insufficient documentation

## 2012-03-13 LAB — URINALYSIS, ROUTINE W REFLEX MICROSCOPIC
Bilirubin Urine: NEGATIVE
Hgb urine dipstick: NEGATIVE
Ketones, ur: NEGATIVE mg/dL
Nitrite: NEGATIVE
Urobilinogen, UA: 0.2 mg/dL (ref 0.0–1.0)
pH: 6 (ref 5.0–8.0)

## 2012-03-13 NOTE — Progress Notes (Signed)
Notified [facility] staff of pt's complaints, states she will come and evaluate, she is in a cesarean section at this time.

## 2012-03-13 NOTE — Telephone Encounter (Signed)
Pt called, is 22w 2d, states had awakened this am with mucusy d/c in her underwear, pt denies any bldg.  States the d/c was about the size of 3 quarters put together.  Last IC was on Monday.  Is only having 3-4 contractions per day.  Has not had a hx of preterm labor or birth.  Per MK, pt to MAU for evaluation of cervix.  Pt voices agreement.

## 2012-03-13 NOTE — MAU Note (Signed)
Pt states she thinks she lost her mucous plug this am, and has noticed a few braxton hicks contractions today

## 2012-03-19 ENCOUNTER — Ambulatory Visit (INDEPENDENT_AMBULATORY_CARE_PROVIDER_SITE_OTHER): Payer: Medicaid Other | Admitting: Obstetrics and Gynecology

## 2012-03-19 VITALS — BP 98/56 | Wt 193.0 lb

## 2012-03-19 DIAGNOSIS — IMO0002 Reserved for concepts with insufficient information to code with codable children: Secondary | ICD-10-CM

## 2012-03-19 DIAGNOSIS — Z331 Pregnant state, incidental: Secondary | ICD-10-CM

## 2012-03-19 DIAGNOSIS — O09529 Supervision of elderly multigravida, unspecified trimester: Secondary | ICD-10-CM

## 2012-03-19 NOTE — Progress Notes (Signed)
C/o contractions . Pt stated she wants a cervix check due unsure if she lost her mucous plug.c/o out of breath a lot not other issues today

## 2012-03-19 NOTE — Progress Notes (Signed)
[redacted]w[redacted]d Glucola equal to 99. Return to office in 4 weeks. Repeat Glucola next visit. Complains of increased contractions.  Fetal fibronectin obtained.  Cervix closed and long.  Comfort measures reviewed. Patient does not want sterilization.  Vasectomy discussed. Dr. Stefano Gaul

## 2012-03-21 ENCOUNTER — Encounter (INDEPENDENT_AMBULATORY_CARE_PROVIDER_SITE_OTHER): Payer: Self-pay | Admitting: General Surgery

## 2012-03-21 ENCOUNTER — Ambulatory Visit (INDEPENDENT_AMBULATORY_CARE_PROVIDER_SITE_OTHER): Payer: Medicaid Other | Admitting: General Surgery

## 2012-03-21 VITALS — BP 130/72 | HR 128 | Temp 97.8°F | Resp 18 | Ht 65.0 in | Wt 196.0 lb

## 2012-03-21 DIAGNOSIS — Z9884 Bariatric surgery status: Secondary | ICD-10-CM

## 2012-03-21 NOTE — Patient Instructions (Signed)
Call as needed for any concerns and then plan to see [place] in a couple of months after delivery

## 2012-03-21 NOTE — Progress Notes (Signed)
Chief complaint: Followup lap band  History: This returns for followup of lap band placed February 2009. She was here last in November after she became pregnant and was having quite a bit of nausea and vomiting. She saw Mardelle Matte removed 1/2 cc from her band. She states this essentially relieved her nausea and vomiting and she has been able to eat without much trouble. She's had maybe one episode of food sticking. She has total about 28 pounds since then with her pregnancy. She is a little bit concerned about her small hernia just above the umbilicus in relation to labor and pregnancy.  Exam: BP 130/72  Pulse 128  Temp 97.8 F (36.6 C) (Temporal)  Resp 18  Ht 5\' 5"  (1.651 m)  Wt 196 lb (88.905 kg)  BMI 32.62 kg/m2  LMP 10/13/2011 General: Appears well Abdomen: Consistent with [redacted] week gestation. A small supraumbilical hernia port site the soft and reducible. Her lap band port incision looks fine.  Assessment and plan: Status post lap band. She is pregnant and we do not want to do a fill at this time. We discussed the thicker hernia is minimal risk in relation to labor. She will call as needed for concerns during her pregnancy otherwise asked her to return about 2 months postpartum to consider a fill.

## 2012-03-24 ENCOUNTER — Encounter: Payer: Medicaid Other | Admitting: Obstetrics and Gynecology

## 2012-04-09 NOTE — L&D Delivery Note (Signed)
Delivery Note At 8:29 PM a viable female was delivered via Vaginal, Spontaneous Delivery (Presentation: Left Occiput Anterior).  APGAR: 9, 9; weight: TBA  Placenta status: Intact, Spontaneous.  Cord: 3 vessels with the following complications: None.  Cord pH: n/a  Anesthesia: Epidural  Episiotomy: None Lacerations: None Suture Repair: n/a Est. Blood Loss (mL): 300  Mom to postpartum.  Baby to nursery-stable. Pt undecided re: BF Mom and baby stable in recovery room   Savyon Loken M 06/29/2012, 9:09 PM

## 2012-04-15 ENCOUNTER — Encounter: Payer: Self-pay | Admitting: Obstetrics and Gynecology

## 2012-04-15 ENCOUNTER — Ambulatory Visit (INDEPENDENT_AMBULATORY_CARE_PROVIDER_SITE_OTHER): Payer: Medicaid Other | Admitting: Obstetrics and Gynecology

## 2012-04-15 VITALS — BP 130/72 | Wt 215.0 lb

## 2012-04-15 DIAGNOSIS — Z331 Pregnant state, incidental: Secondary | ICD-10-CM

## 2012-04-15 DIAGNOSIS — Z9884 Bariatric surgery status: Secondary | ICD-10-CM

## 2012-04-15 DIAGNOSIS — N9989 Other postprocedural complications and disorders of genitourinary system: Secondary | ICD-10-CM

## 2012-04-15 LAB — RPR

## 2012-04-15 NOTE — Addendum Note (Signed)
Addended by: Marla Roe A on: 04/15/2012 10:13 AM   Modules accepted: Orders

## 2012-04-15 NOTE — Addendum Note (Signed)
Addended by: Marla Roe A on: 04/15/2012 10:48 AM   Modules accepted: Orders

## 2012-04-15 NOTE — Progress Notes (Signed)
[redacted]w[redacted]d Pt c/o low back pain 1 gtt given today w/o difficulty

## 2012-04-15 NOTE — Progress Notes (Addendum)
[redacted]w[redacted]d C/o 3ctxs/hr FFN neg at last visit UCx Glucola today RTO 2wks H/o low lying placenta rec by Dr. Claudean Severance to have f/u u/s to recheck placenta at 30wks - will sched for NV and check EFW secondary to h/o gastric banding

## 2012-04-16 LAB — CULTURE, OB URINE
Colony Count: NO GROWTH
Organism ID, Bacteria: NO GROWTH

## 2012-04-16 LAB — GLUCOSE TOLERANCE, 1 HOUR (50G) W/O FASTING: Glucose, 1 Hour GTT: 73 mg/dL (ref 70–140)

## 2012-04-29 ENCOUNTER — Ambulatory Visit: Payer: Medicaid Other | Admitting: Obstetrics and Gynecology

## 2012-04-29 ENCOUNTER — Other Ambulatory Visit (HOSPITAL_COMMUNITY): Payer: Self-pay | Admitting: Maternal and Fetal Medicine

## 2012-04-29 ENCOUNTER — Other Ambulatory Visit: Payer: Self-pay | Admitting: Obstetrics and Gynecology

## 2012-04-29 ENCOUNTER — Ambulatory Visit: Payer: Medicaid Other

## 2012-04-29 VITALS — BP 124/70 | Wt 215.0 lb

## 2012-04-29 DIAGNOSIS — Z9884 Bariatric surgery status: Secondary | ICD-10-CM

## 2012-04-29 DIAGNOSIS — Z3490 Encounter for supervision of normal pregnancy, unspecified, unspecified trimester: Secondary | ICD-10-CM

## 2012-04-29 DIAGNOSIS — O4440 Low lying placenta NOS or without hemorrhage, unspecified trimester: Secondary | ICD-10-CM

## 2012-04-29 LAB — US OB TRANSVAGINAL

## 2012-04-29 LAB — US OB FOLLOW UP

## 2012-04-29 NOTE — Progress Notes (Signed)
[redacted]w[redacted]d 1hr gtt normal 73 Pt complains of nausea and vomiting.

## 2012-04-29 NOTE — Progress Notes (Signed)
[redacted]w[redacted]d Pt doing well. Reviewed [place] -- recheck low lying placenta - now placenta is posterior Reviewed glucola

## 2012-04-30 ENCOUNTER — Encounter: Payer: Self-pay | Admitting: Obstetrics and Gynecology

## 2012-04-30 ENCOUNTER — Ambulatory Visit (HOSPITAL_COMMUNITY): Payer: Medicaid Other

## 2012-04-30 NOTE — Progress Notes (Signed)
additonal note from 04/29/12 visit  Ultrasound shows:  SIUP  S=D     [place] EDD: 07/15/12           AFI: 15 cm = 50th%tile           EFW = 58%tile normal linear growth            Cervical length: 7.8 cm           Placenta localization: posterior           Fetal presentation: vertex          Uterus: Two fibroids are again seen. Left anterior: 4.9 cm x 3.5 cm x 3.0 cm Right anterior: 4.6 cm x 3.2 cm x 4.5 cm cx closed. Measured transvaginally. Normal adnexa

## 2012-05-08 ENCOUNTER — Telehealth: Payer: Self-pay | Admitting: Obstetrics and Gynecology

## 2012-05-08 ENCOUNTER — Telehealth: Payer: Self-pay

## 2012-05-08 NOTE — Telephone Encounter (Signed)
I checked on pt again, since I hadn't heard back from her. She states that there is very little d/c on the pad. Only a circle about the size of a half dollar. Nothing else has changed. I told her to observe over the next couple of hrs, and if she notices an increase in the amt of d/c, or if she has large gush of fluid, decreased FM, or contractions she should call the after hrs provider on call. Pt understands. Melody Comas A

## 2012-05-08 NOTE — Telephone Encounter (Signed)
Spoke to pt who is 30.2 weeks. With increased vaginal discharge. She reports no bleeding, no ctx and good FM. It is not a whole lot of vaginal d/c, but does look and feel thin and watery. No itching or odor associated with it. Pad test was recommended. Pt will do this and get back to me by 4:30. If I don't hear back, I will call to check on her. Depending on results of test,  She may need eval  At MAU. Melody Comas A

## 2012-05-12 ENCOUNTER — Other Ambulatory Visit: Payer: Self-pay | Admitting: Obstetrics and Gynecology

## 2012-05-13 ENCOUNTER — Ambulatory Visit: Payer: Medicaid Other | Admitting: Obstetrics and Gynecology

## 2012-05-13 VITALS — BP 116/70 | Temp 98.0°F | Wt 218.0 lb

## 2012-05-13 DIAGNOSIS — F329 Major depressive disorder, single episode, unspecified: Secondary | ICD-10-CM

## 2012-05-13 DIAGNOSIS — D219 Benign neoplasm of connective and other soft tissue, unspecified: Secondary | ICD-10-CM

## 2012-05-13 DIAGNOSIS — Z331 Pregnant state, incidental: Secondary | ICD-10-CM

## 2012-05-13 DIAGNOSIS — B9689 Other specified bacterial agents as the cause of diseases classified elsewhere: Secondary | ICD-10-CM

## 2012-05-13 DIAGNOSIS — O09529 Supervision of elderly multigravida, unspecified trimester: Secondary | ICD-10-CM

## 2012-05-13 DIAGNOSIS — N760 Acute vaginitis: Secondary | ICD-10-CM

## 2012-05-13 DIAGNOSIS — O26899 Other specified pregnancy related conditions, unspecified trimester: Secondary | ICD-10-CM

## 2012-05-13 DIAGNOSIS — IMO0002 Reserved for concepts with insufficient information to code with codable children: Secondary | ICD-10-CM

## 2012-05-13 LAB — POCT WET PREP (WET MOUNT): Clue Cells Wet Prep Whiff POC: NEGATIVE

## 2012-05-13 MED ORDER — METRONIDAZOLE 0.75 % VA GEL: 1.0000 | Freq: Two times a day (BID) | VAGINAL | Status: DC

## 2012-05-13 MED ORDER — METRONIDAZOLE 500 MG PO TABS: 500.0000 mg | ORAL_TABLET | Freq: Two times a day (BID) | ORAL | Status: AC

## 2012-05-13 MED ORDER — TERCONAZOLE 0.4 % VA CREA: TOPICAL_CREAM | VAGINAL | Status: DC

## 2012-05-13 NOTE — Progress Notes (Signed)
[redacted]w[redacted]d Pt c/o NVD x 2 days. Pt states,"overall not feeling great". Increased FHR on admission.  NST reactive   Pt c/o watery d/c with occ streaks of blood.  SSE;  Clumps of white discharge.  No blood Wet Prep: bv OSOM BV: Positive OSOM TRICH:neg  Metronidazole and terazol  Rec: MFM consult to F/U U/S  As they recommended and to give input re f/u given pt's medications

## 2012-05-14 ENCOUNTER — Telehealth: Payer: Self-pay

## 2012-05-14 NOTE — Telephone Encounter (Signed)
Tc to pt. MFM consult and U/S sched 05/21/12 2 2:00p. Pt agrees.

## 2012-05-16 ENCOUNTER — Other Ambulatory Visit: Payer: Self-pay | Admitting: Obstetrics and Gynecology

## 2012-05-16 ENCOUNTER — Telehealth: Payer: Self-pay

## 2012-05-16 DIAGNOSIS — O09529 Supervision of elderly multigravida, unspecified trimester: Secondary | ICD-10-CM

## 2012-05-16 DIAGNOSIS — IMO0002 Reserved for concepts with insufficient information to code with codable children: Secondary | ICD-10-CM

## 2012-05-16 NOTE — Telephone Encounter (Signed)
LM at pharmacy denying RF on Boric Acid capules. Pt must be seen for RF of this med. Melody Comas A

## 2012-05-16 NOTE — Telephone Encounter (Signed)
Denied RF on Boric Acid caps. Pt must be seen in office to eval need for this med. Melody Comas A

## 2012-05-17 ENCOUNTER — Encounter (HOSPITAL_COMMUNITY): Payer: Self-pay | Admitting: *Deleted

## 2012-05-17 ENCOUNTER — Observation Stay (HOSPITAL_COMMUNITY)
Admission: AD | Admit: 2012-05-17 | Discharge: 2012-05-18 | Disposition: A | Discharge status: Home / Self Care | Payer: Medicaid Other | Source: Ambulatory Visit | Attending: Obstetrics and Gynecology | Admitting: Obstetrics and Gynecology

## 2012-05-17 DIAGNOSIS — O2390 Unspecified genitourinary tract infection in pregnancy, unspecified trimester: Secondary | ICD-10-CM | POA: Insufficient documentation

## 2012-05-17 DIAGNOSIS — Z9884 Bariatric surgery status: Secondary | ICD-10-CM

## 2012-05-17 DIAGNOSIS — K0889 Other specified disorders of teeth and supporting structures: Secondary | ICD-10-CM

## 2012-05-17 DIAGNOSIS — O4700 False labor before 37 completed weeks of gestation, unspecified trimester: Principal | ICD-10-CM | POA: Insufficient documentation

## 2012-05-17 DIAGNOSIS — B9689 Other specified bacterial agents as the cause of diseases classified elsewhere: Secondary | ICD-10-CM | POA: Insufficient documentation

## 2012-05-17 DIAGNOSIS — N760 Acute vaginitis: Secondary | ICD-10-CM | POA: Insufficient documentation

## 2012-05-17 DIAGNOSIS — A499 Bacterial infection, unspecified: Secondary | ICD-10-CM | POA: Insufficient documentation

## 2012-05-17 DIAGNOSIS — R109 Unspecified abdominal pain: Secondary | ICD-10-CM | POA: Insufficient documentation

## 2012-05-17 LAB — URINALYSIS, ROUTINE W REFLEX MICROSCOPIC
Glucose, UA: NEGATIVE mg/dL
Nitrite: NEGATIVE
Specific Gravity, Urine: 1.01 (ref 1.005–1.030)
pH: 6 (ref 5.0–8.0)

## 2012-05-17 LAB — CBC
MCV: 82.2 fL (ref 78.0–100.0)
Platelets: 210 10*3/uL (ref 150–400)
RDW: 14.4 % (ref 11.5–15.5)
WBC: 8.1 10*3/uL (ref 4.0–10.5)

## 2012-05-17 LAB — FETAL FIBRONECTIN: Fetal Fibronectin: POSITIVE — AB

## 2012-05-17 LAB — URINE MICROSCOPIC-ADD ON

## 2012-05-17 LAB — WET PREP, GENITAL

## 2012-05-17 MED ORDER — ZOLPIDEM TARTRATE 5 MG PO TABS
5.0000 mg | ORAL_TABLET | Freq: Every evening | ORAL | Status: DC | PRN
  Administered 2012-05-18: 5 mg via ORAL
  Filled 2012-05-17 (×2): qty 1

## 2012-05-17 MED ORDER — CALCIUM CARBONATE ANTACID 500 MG PO CHEW: 2.0000 | CHEWABLE_TABLET | ORAL | Status: DC | PRN

## 2012-05-17 MED ORDER — METRONIDAZOLE 500 MG PO TABS: 500.0000 mg | ORAL_TABLET | Freq: Two times a day (BID) | ORAL | Status: DC

## 2012-05-17 MED ORDER — AMPHETAMINE-DEXTROAMPHETAMINE 10 MG PO TABS
20.0000 mg | ORAL_TABLET | Freq: Every day | ORAL | Status: DC
  Administered 2012-05-18: 20 mg via ORAL
  Filled 2012-05-17: qty 2
  Filled 2012-05-17: qty 1

## 2012-05-17 MED ORDER — DOCUSATE SODIUM 100 MG PO CAPS: 100.0000 mg | ORAL_CAPSULE | Freq: Every day | ORAL | Status: DC

## 2012-05-17 MED ORDER — BETAMETHASONE SOD PHOS & ACET 6 (3-3) MG/ML IJ SUSP
12.0000 mg | INTRAMUSCULAR | Status: DC
  Administered 2012-05-17: 12 mg via INTRAMUSCULAR
  Filled 2012-05-17: qty 2

## 2012-05-17 MED ORDER — ACETAMINOPHEN 325 MG PO TABS: 650.0000 mg | ORAL_TABLET | ORAL | Status: DC | PRN

## 2012-05-17 MED ORDER — LACTATED RINGERS IV SOLN
INTRAVENOUS | Status: DC
  Administered 2012-05-17 – 2012-05-18 (×3): via INTRAVENOUS

## 2012-05-17 MED ORDER — PRENATAL MULTIVITAMIN CH: 1.0000 | ORAL_TABLET | Freq: Every day | ORAL | Status: DC

## 2012-05-17 NOTE — MAU Note (Signed)
Pt presents with complaints in pain in her lower abdomen and back, also states pain around her belly button. Denies any vaginal bleeding, or LOF

## 2012-05-17 NOTE — MAU Provider Note (Signed)
History    CSN: [id number]  Arrival date and time: 05/17/12 1627  Chief Complaint  Patient presents with  . Abdominal Pain  . Back Pain   HPI Pt is a 36yo at 29w  who presents with c/o of constant low abd pain with sharp exacerbations as well as back pain and pelvic pain.  She reports the pain began this AM and has intensified.  She ranks the pain as 8/10 on scale.  Denies ROM or bldg and states fetus is active.  Unsure if the pain is UCs.  Denies any recent intercourse, unusual vag d/c.  She is currently being treated for bacterial vaginosis with metronidazole 500mg  bid. She denies fever, UTI s/s but has had some nausea and vomiting this AM.  Denies diarrhea or constipation.    OB History   Grav Para Term Preterm Abortions TAB SAB Ect Mult Living   12 5 5  6  6   5       Past Medical History  Diagnosis Date  . Bruises easily   . IBD (inflammatory bowel disease)   . Migraine   . Cyst of ovary 2003  . H/O sickle cell trait   . Trichomonas   . Breast mass in female 2003  . Depression 2003  . DUB (dysfunctional uterine bleeding) 2003  . Menses, irregular 2003  . BV (bacterial vaginosis)   . Hx: UTI (urinary tract infection) 2003  . Low back pain 2003  . Obese 2003  . Vaginitis and vulvovaginitis 2004  . H/O amenorrhea 2006  . GERD (gastroesophageal reflux disease) 2007  . H/O menorrhagia 2011  . Yeast vaginitis 04/24/2010  . Heart murmur 2007  . Anxiety     TAKING MEDS; DR Evelene Croon  . Hypertension 2005    RESOLVED WITH WT LOSS  . Abnormal Pap smear 1997    LAST PAP 10/2010  . Arthritis   . Sacroiliitis   . Infection     OCC YEAST  . Anemia     CHRONIC  . Fibromyalgia 2004  . Fibroid     LONG TERM DX  . Blood dyscrasia     SICKLE CELL TRAIT    Past Surgical History  Procedure Laterality Date  . Cystectomy      obtain date from patient, was not on history form dated 06/16/10  . Laparoscopic gastric banding  05/20/2007  . Cervical cone biopsy  1997  . Wisdom tooth  extraction    . Dilation and curettage of uterus      Family History  Problem Relation Age of Onset  . Diabetes Mother   . Arthritis Mother   . Hyperlipidemia Mother   . Other Mother     VARICOSE VEINS; DECREASED HEART RATE  . Fibromyalgia Mother   . Heart disease Father   . Hypertension Father   . Alcohol abuse Father   . Drug abuse Father   . Stroke Father   . HIV Sister   . Multiple sclerosis Brother   . Muscular dystrophy Brother   . Cancer Maternal Grandmother     Lung  . Depression Maternal Grandmother   . Miscarriages / Stillbirths Maternal Grandmother   . Cancer Paternal Grandmother     Lung  . Birth defects Maternal Aunt     EXTRA DIGITS; MUSCULAR DISEASE  . Early death Maternal Aunt 54  . Mental illness Maternal Uncle   . COPD Maternal Grandfather   . Stroke Maternal Grandfather     History  Substance Use Topics  . Smoking status: Never Smoker   . Smokeless tobacco: Never Used  . Alcohol Use: 0.6 oz/week    1 Glasses of wine per week     Comment: 2X per week MIXED DRINK; LAST USE 11/10/11    Allergies:  Allergies  Allergen Reactions  . Flexeril (Cyclobenzaprine Hcl) Shortness Of Breath, Itching, Swelling and Rash    Swelling of throat  . Latex Shortness Of Breath, Itching and Rash  . Dust Mite Extract Other (See Comments)    Itchy eyes, runny nose  . Pollen Extract Other (See Comments)    Itchy eyes, runny nose     Prescriptions prior to admission  Medication Sig Dispense Refill  . ALPRAZolam (XANAX) 1 MG tablet Take 1 mg by mouth at bedtime as needed.      [name] amphetamine-dextroamphetamine (ADDERALL) 20 MG tablet Take 20 mg by mouth daily.      . ARIPiprazole (ABILIFY) 5 MG tablet Take 5 mg by mouth daily.      . DULoxetine (CYMBALTA) 30 MG capsule Take 30 mg by mouth daily.      . metroNIDAZOLE (FLAGYL) 500 MG tablet Take 1 tablet (500 mg total) by mouth 2 (two) times daily.  14 tablet  0  . prenatal vitamin w/FE, FA (NATACHEW) 29-1 MG CHEW Chew  1 tablet by mouth daily.      [name] zolpidem (AMBIEN) 10 MG tablet Take 10 mg by mouth at bedtime as needed (sleep).       [name] terconazole (TERAZOL 7) 0.4 % vaginal cream Pt to insert 1 applicator full per vagina qhs x 7days.  45 g  0    Review of Systems  Constitutional: Negative.   HENT: Negative.   Eyes: Negative.   Respiratory: Negative.   Cardiovascular: Negative.   Gastrointestinal: Positive for nausea, vomiting and abdominal pain.  Genitourinary: Negative.   Musculoskeletal: Negative.   Skin: Negative.   Neurological: Negative.   Endo/Heme/Allergies: Negative.   Psychiatric/Behavioral: Negative.    Physical Exam   Blood pressure 123/80, pulse 113, temperature 97.4 F (36.3 C), temperature source Oral, resp. rate 16, last menstrual period 10/13/2011.  Physical Exam  Constitutional: She is oriented to person, place, and time. She appears well-developed and well-nourished.  HENT:  Head: Normocephalic and atraumatic.  Right Ear: External ear normal.  Left Ear: External ear normal.  Nose: Nose normal.  Eyes: Conjunctivae are normal. Pupils are equal, round, and reactive to light.  Neck: Normal range of motion. Neck supple. No thyromegaly present.  Cardiovascular: Normal rate, regular rhythm and intact distal pulses.   Respiratory: Effort normal and breath sounds normal.  GI: Soft. Bowel sounds are normal. She exhibits mass. There is no tenderness. There is no rebound and no guarding.  Palpable uterine fibroids noted right and left anterior uterus.  Umbilical hernia without tenderness noted.    Genitourinary: Vagina normal and uterus normal.  Ext gent WNL.  BUS neg.  Mod white discharge in vault.  Cx without lesions or discharge.  Neg CMT.  Ut mobile, soft, non-tender.  SVE: 1-2cm dilation; 50% effacement; -1 station; Vertex; anterior.  Musculoskeletal: Normal range of motion.  Neurological: She is alert and oriented to person, place, and time. She has normal reflexes.  Skin: Skin  is warm and dry.  Psychiatric: She has a normal mood and affect. Her behavior is normal.   FHR baseline: 145 bpm; Variability: Mod; Accels: Present; Decels: Absent. Toco-UCs noted every 2-4 mins and mild to  mod to palpation.   MAU Course  Procedures Results for orders placed during the hospital encounter of 05/17/12 (from the past 24 hour(s))  WET PREP, GENITAL     Status: Abnormal   Collection Time    05/17/12  5:04 PM      Result Value Range   Yeast Wet Prep HPF POC NONE SEEN  NONE SEEN   Trich, Wet Prep NONE SEEN  NONE SEEN   Clue Cells Wet Prep HPF POC MANY (*) NONE SEEN   WBC, Wet Prep HPF POC TOO NUMEROUS TO COUNT (*) NONE SEEN  FETAL FIBRONECTIN     Status: Abnormal   Collection Time    05/17/12  5:04 PM      Result Value Range   Fetal Fibronectin POSITIVE (*) NEGATIVE  URINALYSIS, ROUTINE W REFLEX MICROSCOPIC     Status: Abnormal   Collection Time    05/17/12  5:20 PM      Result Value Range   Color, Urine YELLOW  YELLOW   APPearance CLEAR  CLEAR   Specific Gravity, Urine 1.010  1.005 - 1.030   pH 6.0  5.0 - 8.0   Glucose, UA NEGATIVE  NEGATIVE mg/dL   Hgb urine dipstick NEGATIVE  NEGATIVE   Bilirubin Urine NEGATIVE  NEGATIVE   Ketones, ur NEGATIVE  NEGATIVE mg/dL   Protein, ur NEGATIVE  NEGATIVE mg/dL   Urobilinogen, UA 0.2  0.0 - 1.0 mg/dL   Nitrite NEGATIVE  NEGATIVE   Leukocytes, UA TRACE (*) NEGATIVE  URINE MICROSCOPIC-ADD ON     Status: Abnormal   Collection Time    05/17/12  5:20 PM      Result Value Range   Squamous Epithelial / LPF FEW (*) RARE   WBC, UA 0-2  <3 WBC/hpf  CBC     Status: Abnormal   Collection Time    05/17/12  7:55 PM      Result Value Range   WBC 8.1  4.0 - 10.5 K/uL   RBC 3.43 (*) 3.87 - 5.11 MIL/uL   Hemoglobin 9.4 (*) 12.0 - 15.0 g/dL   HCT 16.1 (*) 09.6 - 04.5 %   MCV 82.2  78.0 - 100.0 fL   MCH 27.4  26.0 - 34.0 pg   MCHC 33.3  30.0 - 36.0 g/dL   RDW 40.9  81.1 - 91.4 %   Platelets 210  150 - 400 K/uL   Urine  culture-pending  Assessment and Plan  IUP at 31w 4d Preterm labor  Consult obtained with Dr. Su Hilt. Admit for 23hr observation and betamethasone.  Continue Metronidazole as previously rxed prior to admission.   Hooper Petteway O. 05/17/2012, 5:31 PM

## 2012-05-17 NOTE — H&P (Signed)
Tiffany [name] is a 37 y.o. female presenting for preterm contractions.  Maternal Medical History:  Reason for admission: Contractions and nausea.   Contractions: Onset was 3-5 hours ago.   Frequency: regular.   Perceived severity is moderate.    Fetal activity: Perceived fetal activity is normal.   Last perceived fetal movement was within the past hour.    Prenatal complications: no prenatal complications  Pt is a 36yo at 31w 5d who presents with c/o of constant low abd pain with sharp exacerbations as well as back pain and pelvic pain. She reports the pain began this AM and has intensified.  She now reports the pain has resolved at present after IVF and rest. Denies ROM or bldg and states fetus is active. Denies any recent intercourse, unusual vag d/c. She is currently being treated for bacterial vaginosis with metronidazole 500mg  bid. She denies fever, UTI s/s but has had some nausea and vomiting this AM. Denies diarrhea or constipation.   OB History   Grav Para Term Preterm Abortions TAB SAB Ect Mult Living   12 5 5  6  6   5      Past Medical History  Diagnosis Date  . Bruises easily   . IBD (inflammatory bowel disease)   . Migraine   . Cyst of ovary 2003  . H/O sickle cell trait   . Trichomonas   . Breast mass in female 2003  . Depression 2003  . DUB (dysfunctional uterine bleeding) 2003  . Menses, irregular 2003  . BV (bacterial vaginosis)   . Hx: UTI (urinary tract infection) 2003  . Low back pain 2003  . Obese 2003  . Vaginitis and vulvovaginitis 2004  . H/O amenorrhea 2006  . GERD (gastroesophageal reflux disease) 2007  . H/O menorrhagia 2011  . Yeast vaginitis 04/24/2010  . Heart murmur 2007  . Anxiety     TAKING MEDS; DR Evelene Croon  . Hypertension 2005    RESOLVED WITH WT LOSS  . Abnormal Pap smear 1997    LAST PAP 10/2010  . Arthritis   . Sacroiliitis   . Infection     OCC YEAST  . Anemia     CHRONIC  . Fibromyalgia 2004  . Fibroid     LONG TERM DX  .  Blood dyscrasia     SICKLE CELL TRAIT   Past Surgical History  Procedure Laterality Date  . Cystectomy      obtain date from patient, was not on history form dated 06/16/10  . Laparoscopic gastric banding  05/20/2007  . Cervical cone biopsy  1997  . Wisdom tooth extraction    . Dilation and curettage of uterus     Family History: family history includes Alcohol abuse in her father; Arthritis in her mother; Birth defects in her maternal aunt; COPD in her maternal grandfather; Cancer in her maternal grandmother and paternal grandmother; Depression in her maternal grandmother; Diabetes in her mother; Drug abuse in her father; Early death (age of onset: 21) in her maternal aunt; Fibromyalgia in her mother; HIV in her sister; Heart disease in her father; Hyperlipidemia in her mother; Hypertension in her father; Mental illness in her maternal uncle; Miscarriages / Stillbirths in her maternal grandmother; Multiple sclerosis in her brother; Muscular dystrophy in her brother; Other in her mother; and Stroke in her father and maternal grandfather. Social History:  reports that she has never smoked. She has never used smokeless tobacco. She reports that she drinks about 0.6 ounces  of alcohol per week. She reports that she does not use illicit drugs.   Prenatal Transfer Tool  Maternal Diabetes: No Genetic Screening: Normal Maternal Ultrasounds/Referrals: Normal Fetal Ultrasounds or other Referrals:  Referred to Materal Fetal Medicine  Maternal Substance Abuse:  No Significant Maternal Medications:  Meds include: Other: Adderall, Xanax prn, Metronidazole, Ambien Significant Maternal Lab Results:  None Other Comments:  None  Review of Systems  Constitutional: Negative.   HENT: Negative.   Eyes: Negative.   Respiratory: Negative.   Cardiovascular: Negative.   Gastrointestinal: Positive for nausea, vomiting and abdominal pain.  Genitourinary: Negative.   Musculoskeletal: Negative.   Skin: Negative.    Neurological: Negative.   Endo/Heme/Allergies: Negative.   Psychiatric/Behavioral: Negative.     Dilation: 1.5 Effacement (%): 50 Station: -1 Exam by:: Conni Elliot, CNM Blood pressure 123/80, pulse 105, temperature 97.4 F (36.3 C), temperature source Oral, resp. rate 16, last menstrual period 10/13/2011, SpO2 100.00%. Maternal Exam:  Uterine Assessment: Contraction strength is moderate.  Contraction frequency is regular.   Abdomen: Patient reports no abdominal tenderness. Fundal height is 31.   Fetal presentation: vertex  Introitus: Normal vulva. Vagina is positive for vaginal discharge.  Ferning test: not done.  Nitrazine test: not done. Amniotic fluid character: not assessed.     Fetal Exam Fetal Monitor Review: Mode: ultrasound.   Baseline rate: 145.  Variability: moderate (6-25 bpm).   Pattern: accelerations present and no decelerations.    Fetal State Assessment: Category I - tracings are normal.     Physical Exam  Constitutional: She is oriented to person, place, and time. She appears well-developed and well-nourished.  HENT:  Head: Normocephalic and atraumatic.  Right Ear: External ear normal.  Left Ear: External ear normal.  Eyes: Conjunctivae are normal. Pupils are equal, round, and reactive to light.  Neck: Normal range of motion. Neck supple. No thyromegaly present.  Cardiovascular: Normal rate, regular rhythm and intact distal pulses.   Respiratory: Effort normal.  GI: Soft. Bowel sounds are normal. She exhibits mass. She exhibits no distension. There is no rebound and no guarding.  Palpable uterine fibroids rt and lt ant abdominal wall as well as umbilical hernia noted which is soft, non-distended.   Genitourinary: Uterus normal. Vaginal discharge found.  Ext genitalia WNL.  BUS neg.  Mod white discharge in vault.  Cx without lesions or discharge present.  Neg CMT.    Musculoskeletal: Normal range of motion.  Neurological: She is alert and oriented to  person, place, and time. She has normal reflexes.  Skin: Skin is warm and dry.  Psychiatric: She has a normal mood and affect. Her behavior is normal. Thought content normal.    Prenatal labs: ABO, Rh: A/POS/-- (08/30 1208) Antibody: NEG (08/30 1208) Rubella: 214.1 (08/30 1208) RPR: NON REAC (01/07 0933)  HBsAg: NEGATIVE (08/30 1208)  HIV: NON REACTIVE (08/30 1208)  GBS:  Pending  Assessment/Plan: IUP at 31w 5d Preterm labor  Per Dr. Su Hilt, admit for 23hr observation and betamethasone.   Urine culture pending.   Wendell Nicoson O. 05/17/2012, 7:36 PM

## 2012-05-18 ENCOUNTER — Encounter (HOSPITAL_COMMUNITY): Payer: Self-pay | Admitting: Surgery

## 2012-05-18 MED ORDER — BETAMETHASONE SOD PHOS & ACET 6 (3-3) MG/ML IJ SUSP
12.0000 mg | INTRAMUSCULAR | Status: AC
  Administered 2012-05-18: 12 mg via INTRAMUSCULAR
  Filled 2012-05-18: qty 2

## 2012-05-18 MED ORDER — ZOLPIDEM TARTRATE 5 MG PO TABS: 5.0000 mg | ORAL_TABLET | Freq: Every evening | ORAL | Status: DC | PRN

## 2012-05-18 MED ORDER — METRONIDAZOLE 500 MG PO TABS
500.0000 mg | ORAL_TABLET | Freq: Two times a day (BID) | ORAL | Status: DC
  Administered 2012-05-18: 500 mg via ORAL
  Filled 2012-05-18 (×4): qty 1

## 2012-05-18 MED ORDER — METRONIDAZOLE 500 MG PO TABS
500.0000 mg | ORAL_TABLET | Freq: Two times a day (BID) | ORAL | Status: DC
  Administered 2012-05-18: 500 mg via ORAL
  Filled 2012-05-18 (×2): qty 1

## 2012-05-18 NOTE — Progress Notes (Signed)
Patient ID: Tiffany [name], female   DOB: March 23, 1976, 37 y.o.   MRN: [id number]  Subjective: Pt states she wants to go home due to uncomfortable bed.  Pt has been held in MAU due to no beds on L&D or antepartum.  States no further pain.  States she only lives a short distance from the hospital and she wants to sleep in her bed.  She reports some cramping but not frequently at present.    Objective: FHR not currently being traced.  Previously reactive and NST ordered q shift. Toco with occas uterine irritability noted but no definite contractions  Assessment/Plan: D/W pt that the recommendation is for her to stay until betamethasone series is complete tomorrow evening.  D/W pt that if she decides that she is going to leave, it will be AMA.  After a thorough discussion, pt states she will stay.  Bed now available in L&D. D/W pt that her home meds have been ordered and enc to use ambien as rxed.

## 2012-05-18 NOTE — OB Triage Note (Signed)
Discharged in good condition with all belongings. Pt has copies of all discharge instructions. Was able to voice understanding.

## 2012-05-18 NOTE — Progress Notes (Signed)
Hospital day # 1 pregnancy at [redacted]w[redacted]d--PTL, +FFN.  S:  Hopes to be d/c'd later today, reports good fetal activity      Perception of contractions:  Very occasional      Vaginal bleeding: None       Vaginal discharge:  None  O: BP 120/69  Pulse 118  Temp(Src) 96.2 F (35.7 C) (Oral)  Resp 16  SpO2 100%  LMP 10/13/2011      Fetal tracings:  Category 1 on q shift NST      Contractions:   Irritability, with 2-3 UCs per hour--reports less than last night.      Uterus non-tender      Extremities: no significant edema and no signs of DVT          Meds: 2nd dose betamethasone due at 8:47p  A: [redacted]w[redacted]d with PTL, +FFN     Stable at present  P: Continue current plan of care      Upcoming tests/treatments:  Complete betamethasone course      MDs will follow--will consult with Dr. Su Hilt regarding timing of possible d/c (? Earlier than 8:47p)  Nigel Bridgeman CNM, [place] 05/18/2012 9:39 AM

## 2012-05-18 NOTE — Discharge Summary (Signed)
Physician Discharge Summary  Patient ID: Tiffany [name] MRN: [id number] DOB/AGE: February 07, 1976 37 y.o.  Admit date: 05/17/2012 Discharge date: 05/18/2012  Admission Diagnoses: 31 5/7 weeks PTL   Discharge Diagnoses:  PTL +FFN  Discharged Condition: stable  Hospital Course: Admitted on 05/17/12 with cramping and abdominal pain.  Cervix was 1.5 cm, 50%, which represented a change from closed the week before.  She had a +FFN during the evaluation phase and was admitted for betamethasone course and monitoring.  She had some contractions that resolved with IV fluids.  Patient was desiring d/c today--Dr. Su Hilt was consulted.  Her cervical exam was unchanged, FHR was reactive, and Dr. Su Hilt was agreeable for patient to receive 2nd dose of betamethasone at noon, allowing for patient to be d/c'd home.  PTL precautions were reviewed.  She is to keep her appt with [facility] on 2/12 and at [facility] on 2/18--office will call patient on Monday to check on her status.  Consults: None  Significant Diagnostic Studies:   Results for orders placed during the hospital encounter of 05/17/12 (from the past 24 hour(s))  WET PREP, GENITAL     Status: Abnormal   Collection Time    05/17/12  5:04 PM      Result Value Range   Yeast Wet Prep HPF POC NONE SEEN  NONE SEEN   Trich, Wet Prep NONE SEEN  NONE SEEN   Clue Cells Wet Prep HPF POC MANY (*) NONE SEEN   WBC, Wet Prep HPF POC TOO NUMEROUS TO COUNT (*) NONE SEEN  FETAL FIBRONECTIN     Status: Abnormal   Collection Time    05/17/12  5:04 PM      Result Value Range   Fetal Fibronectin POSITIVE (*) NEGATIVE  URINALYSIS, ROUTINE W REFLEX MICROSCOPIC     Status: Abnormal   Collection Time    05/17/12  5:20 PM      Result Value Range   Color, Urine YELLOW  YELLOW   APPearance CLEAR  CLEAR   Specific Gravity, Urine 1.010  1.005 - 1.030   pH 6.0  5.0 - 8.0   Glucose, UA NEGATIVE  NEGATIVE mg/dL   Hgb urine dipstick NEGATIVE  NEGATIVE   Bilirubin Urine NEGATIVE   NEGATIVE   Ketones, ur NEGATIVE  NEGATIVE mg/dL   Protein, ur NEGATIVE  NEGATIVE mg/dL   Urobilinogen, UA 0.2  0.0 - 1.0 mg/dL   Nitrite NEGATIVE  NEGATIVE   Leukocytes, UA TRACE (*) NEGATIVE  URINE MICROSCOPIC-ADD ON     Status: Abnormal   Collection Time    05/17/12  5:20 PM      Result Value Range   Squamous Epithelial / LPF FEW (*) RARE   WBC, UA 0-2  <3 WBC/hpf  CBC     Status: Abnormal   Collection Time    05/17/12  7:55 PM      Result Value Range   WBC 8.1  4.0 - 10.5 K/uL   RBC 3.43 (*) 3.87 - 5.11 MIL/uL   Hemoglobin 9.4 (*) 12.0 - 15.0 g/dL   HCT 65.7 (*) 84.6 - 96.2 %   MCV 82.2  78.0 - 100.0 fL   MCH 27.4  26.0 - 34.0 pg   MCHC 33.3  30.0 - 36.0 g/dL   RDW 95.2  84.1 - 32.4 %   Platelets 210  150 - 400 K/uL     Treatments: IV hydration  Discharge Exam: Blood pressure 120/69, pulse 118, temperature 96.2 F (35.7 C), temperature  source Oral, resp. rate 16, last menstrual period 10/13/2011, SpO2 100.00%. General appearance: alert Resp: clear to auscultation bilaterally Pelvic: Cervix unchanged from 2/8 exam--1.5 cm, 50%, vtx, -2, small amount brown d/c. Extremities: extremities normal, atraumatic, no cyanosis or edema  FHR Category 1 Occasional mild contractions--1-3/hour  Disposition: 01-Home or Self Care   Future Appointments Provider Department Dept Phone   05/21/2012 2:15 PM [facility] [place] [facility] [phone]   05/21/2012 3:00 PM [facility] [phone]   05/27/2012 8:30 AM Kirkland Hun, MD [facility] [phone]       Medication List    ASK your doctor about these medications       ALPRAZolam 1 MG tablet  Commonly known as:  XANAX  Take 1 mg by mouth at bedtime as needed.     amphetamine-dextroamphetamine 20 MG tablet  Commonly known as:  ADDERALL  Take 20 mg by mouth daily.     ARIPiprazole 5 MG tablet  Commonly known as:  ABILIFY  Take 5  mg by mouth daily.     DULoxetine 30 MG capsule  Commonly known as:  CYMBALTA  Take 30 mg by mouth daily.     metroNIDAZOLE 500 MG tablet  Commonly known as:  FLAGYL  Take 1 tablet (500 mg total) by mouth 2 (two) times daily.     prenatal vitamin w/FE, FA 29-1 MG Chew  Chew 1 tablet by mouth daily.     terconazole 0.4 % vaginal cream  Commonly known as:  TERAZOL 7  Pt to insert 1 applicator full per vagina qhs x 7days.     zolpidem 10 MG tablet  Commonly known as:  AMBIEN  Take 10 mg by mouth at bedtime as needed (sleep).           Follow-up Information   Follow up with [facility]. (Keep scheduled appt at [facility] on 2/12 and at [facility] on 05/27/12.  Call with any issues or concerns.  Offfice will call you to get an update on your status this weeks.)    Contact information:   [address] [place] [address] [phone]      Signed: Nigel Bridgeman 05/18/2012, 11:48 AM

## 2012-05-19 ENCOUNTER — Encounter: Payer: Medicaid Other | Admitting: Obstetrics and Gynecology

## 2012-05-19 ENCOUNTER — Telehealth: Payer: Self-pay | Admitting: Obstetrics and Gynecology

## 2012-05-19 LAB — URINE CULTURE

## 2012-05-19 NOTE — Telephone Encounter (Signed)
TC to  Pt. States had eaten cereal and still not felt FM.  Was unable to arrange any transportation to office until late this afternoon.  Consult with Dr ND. Advised pt to call 911 to go to MAU. Pt agreeable. Dr SR made aware.

## 2012-05-19 NOTE — Telephone Encounter (Signed)
Per DR SR. Pt has not arrived at MAU.   TC to pt. States baby moved 10 x in one hour.  To continue to monitor.  If notices any decrease in FM or increase in contractions to call any time day or night.

## 2012-05-19 NOTE — Telephone Encounter (Signed)
Add note.  Pt verbalizes comprehension. Dr SR made aware.

## 2012-05-19 NOTE — Telephone Encounter (Signed)
Message copied by Mason Jim on Mon May 19, 2012 11:29 AM ------      Message from: Cornelius Moras      Created: Sun May 18, 2012 11:55 AM      Regarding: Patient f/u call       Please call patient and check on her status--was admitted on 2/8 with cramping, had +FFN, and received betamethasone course.  Has appt at [facility] on 2/12, and next [facility] appt is 2/18.            Thanks!      VL ------

## 2012-05-19 NOTE — Telephone Encounter (Signed)
To to pt. States is doing much better.  Contractions have decreased to 3/hr.  States not aware of any FM this AM. Got up around 9:00 am but has not eaten. To eat and increase fluids. Do fetal kick count x 1 hour. To call with status at that time. Pt states has no transportation until 3:30.

## 2012-05-20 LAB — CULTURE, BETA STREP (GROUP B ONLY)

## 2012-05-21 ENCOUNTER — Ambulatory Visit (HOSPITAL_COMMUNITY): Admission: RE | Admit: 2012-05-21 | Payer: Medicaid Other | Source: Ambulatory Visit

## 2012-05-21 ENCOUNTER — Encounter (HOSPITAL_COMMUNITY): Payer: Self-pay | Admitting: *Deleted

## 2012-05-21 ENCOUNTER — Ambulatory Visit (HOSPITAL_COMMUNITY)
Admission: RE | Admit: 2012-05-21 | Discharge: 2012-05-21 | Disposition: A | Discharge status: Home / Self Care | Payer: Medicaid Other | Source: Ambulatory Visit | Attending: Obstetrics and Gynecology | Admitting: Obstetrics and Gynecology

## 2012-05-21 ENCOUNTER — Inpatient Hospital Stay (HOSPITAL_COMMUNITY)
Admission: AD | Admit: 2012-05-21 | Discharge: 2012-05-21 | Disposition: A | Discharge status: Home / Self Care | Payer: Medicaid Other | Source: Ambulatory Visit | Attending: Obstetrics and Gynecology | Admitting: Obstetrics and Gynecology

## 2012-05-21 VITALS — BP 114/79 | HR 123 | Wt 230.0 lb

## 2012-05-21 DIAGNOSIS — Z9884 Bariatric surgery status: Secondary | ICD-10-CM

## 2012-05-21 DIAGNOSIS — K0889 Other specified disorders of teeth and supporting structures: Secondary | ICD-10-CM

## 2012-05-21 DIAGNOSIS — F1910 Other psychoactive substance abuse, uncomplicated: Secondary | ICD-10-CM | POA: Insufficient documentation

## 2012-05-21 DIAGNOSIS — O99891 Other specified diseases and conditions complicating pregnancy: Secondary | ICD-10-CM | POA: Insufficient documentation

## 2012-05-21 DIAGNOSIS — O09529 Supervision of elderly multigravida, unspecified trimester: Secondary | ICD-10-CM | POA: Insufficient documentation

## 2012-05-21 DIAGNOSIS — IMO0002 Reserved for concepts with insufficient information to code with codable children: Secondary | ICD-10-CM

## 2012-05-21 DIAGNOSIS — O09899 Supervision of other high risk pregnancies, unspecified trimester: Secondary | ICD-10-CM | POA: Insufficient documentation

## 2012-05-21 DIAGNOSIS — R109 Unspecified abdominal pain: Secondary | ICD-10-CM | POA: Insufficient documentation

## 2012-05-21 DIAGNOSIS — N949 Unspecified condition associated with female genital organs and menstrual cycle: Secondary | ICD-10-CM | POA: Insufficient documentation

## 2012-05-21 DIAGNOSIS — O99340 Other mental disorders complicating pregnancy, unspecified trimester: Secondary | ICD-10-CM | POA: Insufficient documentation

## 2012-05-21 NOTE — MAU Note (Signed)
Pt reports pressure in lower abd and pelvis all day, worsening tonight. Denies bleeding or ROM

## 2012-05-21 NOTE — MAU Provider Note (Signed)
History   37 yo [id number] at 32 1/7 weeks presented unannounced c/o pelvic pressure and groin pain today.  Denies contractions, leaking, or bleeding, reports +FM.  No dysuria or frequency.  Was admitted 2/8-2/9 for cramping, with +FFN and cervix 1.5 cm, 50%, vtx.  Received betamethasone course, and had negative cultures, wet prep, and GBS, negative UA.  Has appt with [facility] 2/18 for discussion of med use in pregnancy (Xanax, Adderall, Cymbalta, Abilify)  Patient Active Problem List  Diagnosis  . AMA (advanced maternal age) multigravida 35+  . Depression  . Fibromyalgia  . Arthritis  . IBS (irritable bowel syndrome)  . GERD (gastroesophageal reflux disease)  . Fibroids  . Vaginal yeast infection  . Irregular alveolar process  . Hx of laparoscopic gastric banding  . Preterm labor     Chief Complaint  Patient presents with  . Pelvic Pain     OB History   Grav Para Term Preterm Abortions TAB SAB Ect Mult Living   12 5 5  6  6   5       Past Medical History  Diagnosis Date  . Bruises easily   . IBD (inflammatory bowel disease)   . Migraine   . Cyst of ovary 2003  . H/O sickle cell trait   . Trichomonas   . Breast mass in female 2003  . Depression 2003  . DUB (dysfunctional uterine bleeding) 2003  . Menses, irregular 2003  . BV (bacterial vaginosis)   . Hx: UTI (urinary tract infection) 2003  . Low back pain 2003  . Obese 2003  . Vaginitis and vulvovaginitis 2004  . H/O amenorrhea 2006  . GERD (gastroesophageal reflux disease) 2007  . H/O menorrhagia 2011  . Yeast vaginitis 04/24/2010  . Heart murmur 2007  . Anxiety     TAKING MEDS; DR Evelene Croon  . Hypertension 2005    RESOLVED WITH WT LOSS  . Abnormal Pap smear 1997    LAST PAP 10/2010  . Arthritis   . Sacroiliitis   . Infection     OCC YEAST  . Anemia     CHRONIC  . Fibromyalgia 2004  . Fibroid     LONG TERM DX  . Blood dyscrasia     SICKLE CELL TRAIT    Past Surgical History  Procedure Laterality Date  .  Cystectomy      obtain date from patient, was not on history form dated 06/16/10  . Laparoscopic gastric banding  05/20/2007  . Cervical cone biopsy  1997  . Wisdom tooth extraction    . Dilation and curettage of uterus      Family History  Problem Relation Age of Onset  . Diabetes Mother   . Arthritis Mother   . Hyperlipidemia Mother   . Other Mother     VARICOSE VEINS; DECREASED HEART RATE  . Fibromyalgia Mother   . Heart disease Father   . Hypertension Father   . Alcohol abuse Father   . Drug abuse Father   . Stroke Father   . HIV Sister   . Multiple sclerosis Brother   . Muscular dystrophy Brother   . Cancer Maternal Grandmother     Lung  . Depression Maternal Grandmother   . Miscarriages / Stillbirths Maternal Grandmother   . Cancer Paternal Grandmother     Lung  . Birth defects Maternal Aunt     EXTRA DIGITS; MUSCULAR DISEASE  . Early death Maternal Aunt 55  . Mental illness Maternal Uncle   .  COPD Maternal Grandfather   . Stroke Maternal Grandfather     History  Substance Use Topics  . Smoking status: Never Smoker   . Smokeless tobacco: Never Used  . Alcohol Use: 0.6 oz/week    1 Glasses of wine per week     Comment: 2X per week MIXED DRINK; LAST USE 11/10/11    Allergies:  Allergies  Allergen Reactions  . Flexeril (Cyclobenzaprine Hcl) Shortness Of Breath, Itching, Swelling and Rash    Swelling of throat  . Latex Shortness Of Breath, Itching and Rash  . Dust Mite Extract Other (See Comments)    Itchy eyes, runny nose  . Pollen Extract Other (See Comments)    Itchy eyes, runny nose     Prescriptions prior to admission  Medication Sig Dispense Refill  . ALPRAZolam (XANAX) 1 MG tablet Take 1 mg by mouth at bedtime as needed.      [name] amphetamine-dextroamphetamine (ADDERALL) 20 MG tablet Take 20 mg by mouth daily.      . ARIPiprazole (ABILIFY) 5 MG tablet Take 5 mg by mouth daily.      . DULoxetine (CYMBALTA) 30 MG capsule Take 30 mg by mouth daily.       . prenatal vitamin w/FE, FA (NATACHEW) 29-1 MG CHEW Chew 1 tablet by mouth daily.      [name] terconazole (TERAZOL 7) 0.4 % vaginal cream Pt to insert 1 applicator full per vagina qhs x 7days.  45 g  0  . zolpidem (AMBIEN) 10 MG tablet Take 10 mg by mouth at bedtime as needed (sleep).          Physical Exam   Blood pressure 107/72, pulse 104, temperature 97.6 F (36.4 C), temperature source Oral, resp. rate 18, height 5\' 5"  (1.651 m), weight 217 lb (98.431 kg), last menstrual period 10/13/2011.  Chest clear  Heart RRR without murmur Abd gravid, NT Pelvic--cervix loose 1 cm, 50%, vtx, -2. Ext WNL  FHR Category 1 No contractions  ED Course  IUP at 32 1/7 weeks Pelvic pressure, groin pain--likely due to grand multigravida status. Reactive NST, no contractions. Cervix unchanged from previous exam this week Recent +FFN, with betamethasone course on 2/8-2/9.  Plan: Consulted with Dr. Normand Sloop. Reassured patient no evidence of PTL at present, but recognized patient's general discomfort during pregnancy. D/C'd  home with instructions for increased rest. Offered pain med, but patient declined.   Will keep scheduled visit at [facility] later today, and with [facility] on 2/18--she will also call prn.   Nigel Bridgeman CNM, [place] 05/21/2012 2:23 AM

## 2012-05-21 NOTE — MAU Note (Signed)
Pt states she feel like she has been having more pressure today since about 3pm

## 2012-05-27 ENCOUNTER — Encounter: Payer: Self-pay | Admitting: Obstetrics and Gynecology

## 2012-05-27 ENCOUNTER — Ambulatory Visit: Payer: Medicaid Other | Admitting: Obstetrics and Gynecology

## 2012-05-27 VITALS — BP 110/78 | Wt 219.0 lb

## 2012-05-27 DIAGNOSIS — IMO0002 Reserved for concepts with insufficient information to code with codable children: Secondary | ICD-10-CM

## 2012-05-27 NOTE — Progress Notes (Signed)
[redacted]w[redacted]d  Pt states she has been vomiting. Pt wants cervix check today.  Pt needs another no that is "not so detailed" stating she is under care and cannot travel Pt just started a new job and does not want them to know she is pregnant.

## 2012-05-27 NOTE — Progress Notes (Signed)
[redacted]w[redacted]d The patient was seen in maternity admission for pelvic pressure.  She was seen in maternal fetal medicine for a followup evaluation.  There currently not available. Cervix is 1.5 cm, 50%, -3. Firm. Return to office in 2 weeks. The patient requests a note for work.  Dr. Stefano Gaul

## 2012-06-09 ENCOUNTER — Inpatient Hospital Stay (HOSPITAL_COMMUNITY): Payer: Medicaid Other

## 2012-06-09 ENCOUNTER — Encounter: Payer: Self-pay | Admitting: Obstetrics and Gynecology

## 2012-06-09 ENCOUNTER — Ambulatory Visit: Payer: Medicaid Other | Admitting: Obstetrics and Gynecology

## 2012-06-09 ENCOUNTER — Other Ambulatory Visit: Payer: Self-pay | Admitting: Obstetrics and Gynecology

## 2012-06-09 ENCOUNTER — Inpatient Hospital Stay (HOSPITAL_COMMUNITY)
Admission: AD | Admit: 2012-06-09 | Discharge: 2012-06-09 | Disposition: A | Discharge status: Home / Self Care | Payer: Medicaid Other | Source: Ambulatory Visit | Attending: Obstetrics and Gynecology | Admitting: Obstetrics and Gynecology

## 2012-06-09 ENCOUNTER — Encounter (HOSPITAL_COMMUNITY): Payer: Self-pay | Admitting: *Deleted

## 2012-06-09 DIAGNOSIS — O368190 Decreased fetal movements, unspecified trimester, not applicable or unspecified: Secondary | ICD-10-CM | POA: Insufficient documentation

## 2012-06-09 DIAGNOSIS — O99340 Other mental disorders complicating pregnancy, unspecified trimester: Secondary | ICD-10-CM | POA: Insufficient documentation

## 2012-06-09 DIAGNOSIS — IMO0002 Reserved for concepts with insufficient information to code with codable children: Secondary | ICD-10-CM

## 2012-06-09 DIAGNOSIS — F329 Major depressive disorder, single episode, unspecified: Secondary | ICD-10-CM | POA: Insufficient documentation

## 2012-06-09 DIAGNOSIS — O368390 Maternal care for abnormalities of the fetal heart rate or rhythm, unspecified trimester, not applicable or unspecified: Secondary | ICD-10-CM | POA: Insufficient documentation

## 2012-06-09 DIAGNOSIS — Z9884 Bariatric surgery status: Secondary | ICD-10-CM

## 2012-06-09 DIAGNOSIS — K0889 Other specified disorders of teeth and supporting structures: Secondary | ICD-10-CM

## 2012-06-09 DIAGNOSIS — F3289 Other specified depressive episodes: Secondary | ICD-10-CM | POA: Insufficient documentation

## 2012-06-09 DIAGNOSIS — O09529 Supervision of elderly multigravida, unspecified trimester: Secondary | ICD-10-CM | POA: Insufficient documentation

## 2012-06-09 LAB — CBC WITH DIFFERENTIAL/PLATELET
Basophils Absolute: 0 10*3/uL (ref 0.0–0.1)
Eosinophils Absolute: 0.2 10*3/uL (ref 0.0–0.7)
Eosinophils Relative: 2 % (ref 0–5)
Lymphocytes Relative: 25 % (ref 12–46)
MCV: 79 fL (ref 78.0–100.0)
Neutrophils Relative %: 64 % (ref 43–77)
Platelets: 285 10*3/uL (ref 150–400)
RDW: 14.9 % (ref 11.5–15.5)
WBC: 8.3 10*3/uL (ref 4.0–10.5)

## 2012-06-09 LAB — COMPREHENSIVE METABOLIC PANEL
ALT: 6 U/L (ref 0–35)
AST: 18 U/L (ref 0–37)
CO2: 23 mEq/L (ref 19–32)
Calcium: 9 mg/dL (ref 8.4–10.5)
Potassium: 3.3 mEq/L — ABNORMAL LOW (ref 3.5–5.1)
Sodium: 136 mEq/L (ref 135–145)
Total Protein: 7.1 g/dL (ref 6.0–8.3)

## 2012-06-09 MED ORDER — LACTATED RINGERS IV BOLUS (SEPSIS)
500.0000 mL | Freq: Once | INTRAVENOUS | Status: AC
  Administered 2012-06-09: 1000 mL via INTRAVENOUS

## 2012-06-09 MED ORDER — LACTATED RINGERS IV SOLN: INTRAVENOUS | Status: DC

## 2012-06-09 MED ORDER — PROMETHAZINE HCL 25 MG/ML IJ SOLN
12.5000 mg | INTRAMUSCULAR | Status: DC | PRN
  Administered 2012-06-09: 12.5 mg via INTRAVENOUS
  Filled 2012-06-09: qty 1

## 2012-06-09 NOTE — Progress Notes (Signed)
[redacted]w[redacted]d Pt with multiple complaints.  She feels anxious and depressed.  She denies HI or SI.  She was placed on Prozac 2 weeks ago by Dr Lafayette Dragon.  She states her job is stressing her out.  She also c/ devreaed fetal movement with contractions.  She has not been eating well because of nausea NST 170-180.  Category 2.  To MAU for prolonged monitoring, IVF and labs if needed Wet prep done sig for WBCs pt will need cultures and GBS@NV  Advanced cervical diation.  Pt taken out of work  And placed on modified bed rest

## 2012-06-09 NOTE — MAU Note (Signed)
Patient states she was in the office and fetal heart beat was high. Sent from the office for evaluation. Patient states she has some irregular contractions. State she has not felt normal fetal movement since 2-27. Has some discharge, no bleeding or leaking.

## 2012-06-09 NOTE — MAU Provider Note (Signed)
History   37 yo [id number] at 34 6/7 weeks presented from office for evaluation of fetal tachycardia (FHR 170-180 per auscultation), poor appetite, "just feeling bad".  Had appt with Dr. Evelene Croon at 11am today for f/u depression, but Dr. Normand Sloop sent the patient to MAU for further evaluation.  Patient reports less FM since 2/27, with diminished appetite--reports less fluid intake, as well.  Denies leaking or bleeding, reports irregular contractions.  Cervix today per Dr. Normand Sloop = 2-3 cm, 50%, vtx, -3 (cervix has been 1.5, 50%, vtx, -3 on last exam 05/27/12).  Received Betamethasone at 32 weeks for +FFN.  Denies SI/H today.  Patient Active Problem List  Diagnosis  . AMA (advanced maternal age) multigravida 35+  . Depression  . Fibromyalgia  . Arthritis  . IBS (irritable bowel syndrome)  . GERD (gastroesophageal reflux disease)  . Fibroids  . Vaginal yeast infection  . Irregular alveolar process  . Hx of laparoscopic gastric banding  . Preterm labor  Has been followed at [facility] for multiple psychotropic meds during pregnancy (Xanax, Adderal, Abilify, Cymbalta, Ambien).  Last [place] at [facility] 2/12/, with EFW 4+8, 68%ile, AFI 14%ile. Scheduled for next [place] there on 3/12.   OB History   Grav Para Term Preterm Abortions TAB SAB Ect Mult Living   12 5 5  6  6   5       Past Medical History  Diagnosis Date  . Bruises easily   . IBD (inflammatory bowel disease)   . Migraine   . Cyst of ovary 2003  . H/O sickle cell trait   . Trichomonas   . Breast mass in female 2003  . Depression 2003  . DUB (dysfunctional uterine bleeding) 2003  . Menses, irregular 2003  . BV (bacterial vaginosis)   . Hx: UTI (urinary tract infection) 2003  . Low back pain 2003  . Obese 2003  . Vaginitis and vulvovaginitis 2004  . H/O amenorrhea 2006  . GERD (gastroesophageal reflux disease) 2007  . H/O menorrhagia 2011  . Yeast vaginitis 04/24/2010  . Heart murmur 2007  . Anxiety     TAKING MEDS; DR Evelene Croon  . Hypertension  2005    RESOLVED WITH WT LOSS  . Abnormal Pap smear 1997    LAST PAP 10/2010  . Arthritis   . Sacroiliitis   . Infection     OCC YEAST  . Anemia     CHRONIC  . Fibromyalgia 2004  . Fibroid     LONG TERM DX  . Blood dyscrasia     SICKLE CELL TRAIT    Past Surgical History  Procedure Laterality Date  . Cystectomy      obtain date from patient, was not on history form dated 06/16/10  . Laparoscopic gastric banding  05/20/2007  . Cervical cone biopsy  1997  . Wisdom tooth extraction    . Dilation and curettage of uterus      Family History  Problem Relation Age of Onset  . Diabetes Mother   . Arthritis Mother   . Hyperlipidemia Mother   . Other Mother     VARICOSE VEINS; DECREASED HEART RATE  . Fibromyalgia Mother   . Heart disease Father   . Hypertension Father   . Alcohol abuse Father   . Drug abuse Father   . Stroke Father   . HIV Sister   . Multiple sclerosis Brother   . Muscular dystrophy Brother   . Cancer Maternal Grandmother     Lung  .  Depression Maternal Grandmother   . Miscarriages / Stillbirths Maternal Grandmother   . Cancer Paternal Grandmother     Lung  . Birth defects Maternal Aunt     EXTRA DIGITS; MUSCULAR DISEASE  . Early death Maternal Aunt 70  . Mental illness Maternal Uncle   . COPD Maternal Grandfather   . Stroke Maternal Grandfather     History  Substance Use Topics  . Smoking status: Never Smoker   . Smokeless tobacco: Never Used  . Alcohol Use: 0.6 oz/week    1 Glasses of wine per week     Comment: 2X per week MIXED DRINK; LAST USE 11/10/11    Allergies:  Allergies  Allergen Reactions  . Flexeril (Cyclobenzaprine Hcl) Shortness Of Breath, Itching, Swelling and Rash    Swelling of throat  . Latex Shortness Of Breath, Itching and Rash  . Dust Mite Extract Other (See Comments)    Itchy eyes, runny nose  . Pollen Extract Other (See Comments)    Itchy eyes, runny nose     Prescriptions prior to admission  Medication Sig  Dispense Refill  . ALPRAZolam (XANAX) 1 MG tablet Take 1 mg by mouth at bedtime as needed for anxiety.       [name] amphetamine-dextroamphetamine (ADDERALL) 20 MG tablet Take 20 mg by mouth daily.      [name] FLUoxetine (PROZAC) 40 MG capsule Take 40 mg by mouth daily.      [name] zolpidem (AMBIEN) 10 MG tablet Take 10 mg by mouth at bedtime as needed (sleep).          Physical Exam   Blood pressure 119/83, pulse 114, temperature 98.3 F (36.8 C), temperature source Oral, resp. rate 16, last menstrual period 10/13/2011, SpO2 100.00%.  Chest clear Heart RRR without murmur, rate of 114 Abd gravid, NT Pelvic--deferred at MAU, was 2-3 cm, 50%, vtx, -3 at [facility] Ext WNL  FHR--initially 160-170, decreased variability.  After initiation of IV hydration, now 150-160, with moderate LT variability--still decreased ST variability.  No decels. UCs--mild irritability.  ED Course  IUP at 34 6/7 weeks Fetal tachycardia Decreased FM Depression  Plan: Consulted initially with Dr. Normand Sloop, who sent patient over--initial orders for IV hydration, Phenergan, GC/chlamydia on urine. F/U with Dr. Estanislado Pandy as today's on-call MD--CBC, diff, TSH, complete OB US and BPP (she recommended it be done in [facility], since patient has been followed there.  [facility] closing at 11:30am today, so could not see.  They also reported they could not see an MAU patient--only if the patient is admitted to an in-patient bed or if she were d/c'd from MAU before going to [facility]).  Will do [place] and BPP in [facility] [place] department. Reviewed plan with patient--she is agreeable with plan.    Nigel Bridgeman CNM, [place] 06/09/2012 11:32 AM   Addendum:  Returned from [place]: Impression:  Single living IUP with assigned GA of 34w 6d in cephalic position. EFW is at the 86th percentile. BPP is 8 out of 8. Normal amniotic fluid volume. 3.8 cm anterior intramural fibroid.  Results for orders placed during the hospital encounter of 06/09/12 (from the past 24 hour(s))  CBC WITH  DIFFERENTIAL     Status: Abnormal   Collection Time    06/09/12 10:20 AM      Result Value Range   WBC 8.3  4.0 - 10.5 K/uL   RBC 4.09  3.87 - 5.11 MIL/uL   Hemoglobin 10.6 (*) 12.0 - 15.0 g/dL   HCT 13.0 (*) 86.5 -  46.0 %   MCV 79.0  78.0 - 100.0 fL   MCH 25.9 (*) 26.0 - 34.0 pg   MCHC 32.8  30.0 - 36.0 g/dL   RDW 16.1  09.6 - 04.5 %   Platelets 285  150 - 400 K/uL   Neutrophils Relative 64  43 - 77 %   Neutro Abs 5.3  1.7 - 7.7 K/uL   Lymphocytes Relative 25  12 - 46 %   Lymphs Abs 2.1  0.7 - 4.0 K/uL   Monocytes Relative 9  3 - 12 %   Monocytes Absolute 0.7  0.1 - 1.0 K/uL   Eosinophils Relative 2  0 - 5 %   Eosinophils Absolute 0.2  0.0 - 0.7 K/uL   Basophils Relative 0  0 - 1 %   Basophils Absolute 0.0  0.0 - 0.1 K/uL  COMPREHENSIVE METABOLIC PANEL     Status: Abnormal   Collection Time    06/09/12 10:20 AM      Result Value Range   Sodium 136  135 - 145 mEq/L   Potassium 3.3 (*) 3.5 - 5.1 mEq/L   Chloride 100  96 - 112 mEq/L   CO2 23  19 - 32 mEq/L   Glucose, Bld 85  70 - 99 mg/dL   BUN 3 (*) 6 - 23 mg/dL   Creatinine, Ser 4.09  0.50 - 1.10 mg/dL   Calcium 9.0  8.4 - 81.1 mg/dL   Total Protein 7.1  6.0 - 8.3 g/dL   Albumin 2.6 (*) 3.5 - 5.2 g/dL   AST 18  0 - 37 U/L   ALT 6  0 - 35 U/L   Alkaline Phosphatase 114  39 - 117 U/L   Total Bilirubin 0.3  0.3 - 1.2 mg/dL   GFR calc non Af Amer >90  >90 mL/min   GFR calc Af Amer >90  >90 mL/min   Feeling better since IV fluids.  Reviewed results of [place] and labs with patient. TSH pending.  Consulted with Dr. Estanislado Pandy. D/C home, with recommendations to push fluids and increase intake as able. Will f/u with Dr. Evelene Croon this week--patient wants to schedule this herself.  She is to call if she has trouble getting appt this week.  She is also to notify Dr. Evelene Croon or [place] with any worsening of depression, anxiety. FKCs reviewed. Keep scheduled appt on 06/16/12 at [facility]--will cancel [place] scheduled for that day. Has [facility] appt 3/12--will  keep that appt. Support to patient for work stressors--Dr. Normand Sloop took her out of work this week.  I will f/u with Dr. Normand Sloop regarding patient's questions about further work concerns.  Patient will also f/u with Dr. Evelene Croon regarding her work issues.  Nigel Bridgeman, CNM 06/09/12 2:10p

## 2012-06-10 ENCOUNTER — Telehealth: Payer: Self-pay | Admitting: Obstetrics and Gynecology

## 2012-06-10 ENCOUNTER — Inpatient Hospital Stay (HOSPITAL_COMMUNITY)
Admission: AD | Admit: 2012-06-10 | Discharge: 2012-06-10 | Disposition: A | Discharge status: Home / Self Care | Payer: Medicaid Other | Source: Ambulatory Visit | Attending: Obstetrics and Gynecology | Admitting: Obstetrics and Gynecology

## 2012-06-10 ENCOUNTER — Encounter (HOSPITAL_COMMUNITY): Payer: Self-pay | Admitting: *Deleted

## 2012-06-10 DIAGNOSIS — O368390 Maternal care for abnormalities of the fetal heart rate or rhythm, unspecified trimester, not applicable or unspecified: Secondary | ICD-10-CM | POA: Insufficient documentation

## 2012-06-10 DIAGNOSIS — O4690 Antepartum hemorrhage, unspecified, unspecified trimester: Secondary | ICD-10-CM | POA: Insufficient documentation

## 2012-06-10 DIAGNOSIS — O99840 Bariatric surgery status complicating pregnancy, unspecified trimester: Secondary | ICD-10-CM | POA: Insufficient documentation

## 2012-06-10 DIAGNOSIS — Z9884 Bariatric surgery status: Secondary | ICD-10-CM

## 2012-06-10 DIAGNOSIS — K0889 Other specified disorders of teeth and supporting structures: Secondary | ICD-10-CM

## 2012-06-10 NOTE — MAU Note (Signed)
C/o seeing blood when she wiped today;

## 2012-06-10 NOTE — MAU Note (Signed)
Noted bleeding around 1530, noted x2 after voiding.  Blood tinged large amt of mucous d/c last night.  Was 2-3 yesterday in office.

## 2012-06-10 NOTE — MAU Note (Signed)
Name and DOB verified, pt confirms spelling is correct on IDband.

## 2012-06-10 NOTE — Telephone Encounter (Signed)
Pt called, is 35 wks and states that she was seen @ the hospital yesterday by VL.  Pt had a vaginal check and was told that she is dilated to 2-3 cm.  Pt states last pm had some mucusy d/c that was blood tinged.  Today she is constipated, has gone to the bathroom x 2 and when she wiped she had some vaginal bleeding that is bright red.  When asked how much bleeding there is, pt states that these is "a good amount".  Pt denies any pain but is having some vaginal pressure.  Does report + fm.  Consulted w/ VL, pt advised to go to MAU for evaluation, pt voices agreement.

## 2012-06-10 NOTE — MAU Provider Note (Signed)
History   37 yo [id number] at 35 weeks presented after calling the office c/o BRB from vaginal after BM today.  Does report mild straining at stool.  Denies any recent IC.  Cervix has been 2-3 cm, 50% on exam.    Was seen yesterday at [facility] and at [facility] for fetal tachycardia and "just feeling bad".  Had normal labs, [place], and BPP, with reactive tracing.  Has f/u appt with Dr. Evelene Croon this week for depression.    Also concerned about work--has new job requiring travel to [place], but is concerned about hx of rapid labor and cervix already dilated.  Dr. Normand Sloop took her out of work this week due to stress.  Chief Complaint  Patient presents with  . Vaginal Bleeding     OB History   Grav Para Term Preterm Abortions TAB SAB Ect Mult Living   12 5 5  6  6   5       Past Medical History  Diagnosis Date  . Bruises easily   . IBD (inflammatory bowel disease)   . Migraine   . Cyst of ovary 2003  . H/O sickle cell trait   . Trichomonas   . Breast mass in female 2003  . Depression 2003  . DUB (dysfunctional uterine bleeding) 2003  . Menses, irregular 2003  . BV (bacterial vaginosis)   . Hx: UTI (urinary tract infection) 2003  . Low back pain 2003  . Obese 2003  . Vaginitis and vulvovaginitis 2004  . H/O amenorrhea 2006  . GERD (gastroesophageal reflux disease) 2007  . H/O menorrhagia 2011  . Yeast vaginitis 04/24/2010  . Heart murmur 2007  . Anxiety     TAKING MEDS; DR Evelene Croon  . Hypertension 2005    RESOLVED WITH WT LOSS  . Abnormal Pap smear 1997    LAST PAP 10/2010  . Arthritis   . Sacroiliitis   . Infection     OCC YEAST  . Anemia     CHRONIC  . Fibromyalgia 2004  . Fibroid     LONG TERM DX  . Blood dyscrasia     SICKLE CELL TRAIT    Past Surgical History  Procedure Laterality Date  . Cystectomy      obtain date from patient, was not on history form dated 06/16/10  . Laparoscopic gastric banding  05/20/2007  . Cervical cone biopsy  1997  . Wisdom tooth extraction    .  Dilation and curettage of uterus      Family History  Problem Relation Age of Onset  . Diabetes Mother   . Arthritis Mother   . Hyperlipidemia Mother   . Other Mother     VARICOSE VEINS; DECREASED HEART RATE  . Fibromyalgia Mother   . Heart disease Father   . Hypertension Father   . Alcohol abuse Father   . Drug abuse Father   . Stroke Father   . HIV Sister   . Multiple sclerosis Brother   . Muscular dystrophy Brother   . Cancer Maternal Grandmother     Lung  . Depression Maternal Grandmother   . Miscarriages / Stillbirths Maternal Grandmother   . Cancer Paternal Grandmother     Lung  . Birth defects Maternal Aunt     EXTRA DIGITS; MUSCULAR DISEASE  . Early death Maternal Aunt 78  . Mental illness Maternal Uncle   . COPD Maternal Grandfather   . Stroke Maternal Grandfather     History  Substance Use Topics  .  Smoking status: Never Smoker   . Smokeless tobacco: Never Used  . Alcohol Use: 0.6 oz/week    1 Glasses of wine per week     Comment: 2X per week MIXED DRINK; LAST USE 11/10/11    Allergies:  Allergies  Allergen Reactions  . Flexeril (Cyclobenzaprine Hcl) Shortness Of Breath, Itching, Swelling and Rash    Swelling of throat  . Latex Shortness Of Breath, Itching and Rash  . Dust Mite Extract Other (See Comments)    Itchy eyes, runny nose  . Pollen Extract Other (See Comments)    Itchy eyes, runny nose     Prescriptions prior to admission  Medication Sig Dispense Refill  . ALPRAZolam (XANAX) 1 MG tablet Take 1 mg by mouth as needed for anxiety.       [name] amphetamine-dextroamphetamine (ADDERALL) 20 MG tablet Take 20 mg by mouth daily.      [name] FLUoxetine (PROZAC) 40 MG capsule Take 40 mg by mouth daily.      [name] zolpidem (AMBIEN) 10 MG tablet Take 10 mg by mouth at bedtime as needed (sleep).         Physical Exam   Blood pressure 118/79, pulse 125, temperature 98 F (36.7 C), temperature source Oral, resp. rate 20, height 5' 4.5" (1.638 m), weight 220 lb  (99.791 kg), last menstrual period 10/13/2011.  Chest clear Heart RRR without murmur Abd gravid, NT Pelvic--speculum exam shows no blood in vault.  Moderate amount white d/c in vault.  Cervix has one area of friability noted, with no active bleeding at present.  Cervix 2-3 cm, 50%, vtx, -1--no change from previous exam yesterday. Ext WNL  FHR Category 1 UCs--occasional, mild.   ED Course  IUP at 35 weeks Friable cervix  Plan: GC, chlamydia, GBS, and wet prep done. Will d/c home and f/u with patient prn if wet prep shows issue requiring treatment. Patient to remain on pelvic rest and to f/u with any bleeding like a period. Keep upcoming scheduled appts at [facility] and [facility] next week.   Nigel Bridgeman CNM, MN 06/10/2012 7:10 PM

## 2012-06-11 LAB — GC/CHLAMYDIA PROBE AMP: GC Probe RNA: NEGATIVE

## 2012-06-13 LAB — CULTURE, BETA STREP (GROUP B ONLY)

## 2012-06-16 ENCOUNTER — Other Ambulatory Visit: Payer: Medicaid Other

## 2012-06-16 ENCOUNTER — Ambulatory Visit: Payer: Medicaid Other | Admitting: Family Medicine

## 2012-06-16 VITALS — BP 122/70 | Wt 219.0 lb

## 2012-06-16 DIAGNOSIS — Z331 Pregnant state, incidental: Secondary | ICD-10-CM

## 2012-06-16 NOTE — Progress Notes (Signed)
101w6d S: Encouraged to try tylenol 2 tablets q 4-6 hrs/PRN (not to exceed 10/day) for headaches.  Denies BV, dizziness, RUQ pain. Feels lots of pressure and irregular contractions.  Mood is okay, denies HI/SI.  Plans to f/u with Dr. Lafayette Dragon. O: Abdomen soft, no pain in RUQ on mild palpation.  No facial or LE swelling noted.  DTR's 2+. A: GC/CL/GBS negative.  BP WNL for patient.  P: 1. HA: Patient to call with any vision changes, facial or LE swelling.      2. Pregnancy: Discussed PTL, FKCs, ROB in 1 week. Needs car seat, unsure if plans to breast/bottle feed, peds?, Nexplanon for contraception. L.Carter, FNP-BC

## 2012-06-16 NOTE — Patient Instructions (Signed)

## 2012-06-16 NOTE — Progress Notes (Signed)
[redacted]w[redacted]d Pt complains of headaches x1 wk. Streaks of bloody show which was reported on last ov. Pt states that GBS and Cultures were done last wk at hospital. Wants cervix check.

## 2012-06-17 ENCOUNTER — Other Ambulatory Visit: Payer: Self-pay | Admitting: Obstetrics and Gynecology

## 2012-06-17 DIAGNOSIS — O09529 Supervision of elderly multigravida, unspecified trimester: Secondary | ICD-10-CM

## 2012-06-17 DIAGNOSIS — F1910 Other psychoactive substance abuse, uncomplicated: Secondary | ICD-10-CM

## 2012-06-17 NOTE — Telephone Encounter (Signed)
Note to close enc. Oakley, Jacqueline A  

## 2012-06-18 ENCOUNTER — Ambulatory Visit (HOSPITAL_COMMUNITY): Payer: Medicaid Other

## 2012-06-23 ENCOUNTER — Encounter: Payer: Medicaid Other | Admitting: Family Medicine

## 2012-06-29 ENCOUNTER — Inpatient Hospital Stay (HOSPITAL_COMMUNITY)
Admission: AD | Admit: 2012-06-29 | Discharge: 2012-07-01 | DRG: 775 | Disposition: A | Discharge status: Home / Self Care | Payer: Medicaid Other | Source: Ambulatory Visit | Attending: Obstetrics and Gynecology | Admitting: Obstetrics and Gynecology

## 2012-06-29 ENCOUNTER — Encounter (HOSPITAL_COMMUNITY): Payer: Self-pay | Admitting: Anesthesiology

## 2012-06-29 ENCOUNTER — Encounter (HOSPITAL_COMMUNITY): Payer: Self-pay | Admitting: *Deleted

## 2012-06-29 ENCOUNTER — Inpatient Hospital Stay (HOSPITAL_COMMUNITY): Payer: Medicaid Other | Admitting: Anesthesiology

## 2012-06-29 DIAGNOSIS — O09891 Supervision of other high risk pregnancies, first trimester: Secondary | ICD-10-CM

## 2012-06-29 DIAGNOSIS — K219 Gastro-esophageal reflux disease without esophagitis: Secondary | ICD-10-CM | POA: Diagnosis present

## 2012-06-29 DIAGNOSIS — O139 Gestational [pregnancy-induced] hypertension without significant proteinuria, unspecified trimester: Secondary | ICD-10-CM | POA: Diagnosis present

## 2012-06-29 DIAGNOSIS — O99344 Other mental disorders complicating childbirth: Principal | ICD-10-CM | POA: Diagnosis present

## 2012-06-29 DIAGNOSIS — O34599 Maternal care for other abnormalities of gravid uterus, unspecified trimester: Secondary | ICD-10-CM | POA: Diagnosis present

## 2012-06-29 DIAGNOSIS — D4959 Neoplasm of unspecified behavior of other genitourinary organ: Secondary | ICD-10-CM | POA: Diagnosis present

## 2012-06-29 DIAGNOSIS — O09529 Supervision of elderly multigravida, unspecified trimester: Secondary | ICD-10-CM | POA: Diagnosis present

## 2012-06-29 DIAGNOSIS — IMO0001 Reserved for inherently not codable concepts without codable children: Secondary | ICD-10-CM

## 2012-06-29 DIAGNOSIS — F329 Major depressive disorder, single episode, unspecified: Secondary | ICD-10-CM | POA: Diagnosis present

## 2012-06-29 DIAGNOSIS — O99892 Other specified diseases and conditions complicating childbirth: Secondary | ICD-10-CM | POA: Diagnosis present

## 2012-06-29 DIAGNOSIS — K589 Irritable bowel syndrome without diarrhea: Secondary | ICD-10-CM | POA: Diagnosis present

## 2012-06-29 DIAGNOSIS — D649 Anemia, unspecified: Secondary | ICD-10-CM | POA: Diagnosis not present

## 2012-06-29 DIAGNOSIS — D259 Leiomyoma of uterus, unspecified: Secondary | ICD-10-CM | POA: Diagnosis present

## 2012-06-29 DIAGNOSIS — O99844 Bariatric surgery status complicating childbirth: Secondary | ICD-10-CM | POA: Diagnosis present

## 2012-06-29 DIAGNOSIS — Z9884 Bariatric surgery status: Secondary | ICD-10-CM

## 2012-06-29 DIAGNOSIS — K0889 Other specified disorders of teeth and supporting structures: Secondary | ICD-10-CM

## 2012-06-29 DIAGNOSIS — F3289 Other specified depressive episodes: Secondary | ICD-10-CM | POA: Diagnosis present

## 2012-06-29 DIAGNOSIS — O9903 Anemia complicating the puerperium: Secondary | ICD-10-CM | POA: Diagnosis not present

## 2012-06-29 LAB — COMPREHENSIVE METABOLIC PANEL
AST: 13 U/L (ref 0–37)
Alkaline Phosphatase: 129 U/L — ABNORMAL HIGH (ref 39–117)
BUN: <3 mg/dL — ABNORMAL LOW (ref 6–23)
CO2: 24 mEq/L (ref 19–32)
Chloride: 100 mEq/L (ref 96–112)
Creatinine, Ser: 0.68 mg/dL (ref 0.50–1.10)
GFR calc non Af Amer: >90 mL/min (ref >90)
Total Bilirubin: 0.4 mg/dL (ref 0.3–1.2)

## 2012-06-29 LAB — URINALYSIS, MICROSCOPIC ONLY
Leukocytes, UA: NEGATIVE
Protein, ur: NEGATIVE mg/dL
Urobilinogen, UA: 0.2 mg/dL (ref 0.0–1.0)

## 2012-06-29 LAB — CBC
MCHC: 33.5 g/dL (ref 30.0–36.0)
Platelets: 250 10*3/uL (ref 150–400)
RDW: 16.1 % — ABNORMAL HIGH (ref 11.5–15.5)
WBC: 8.7 10*3/uL (ref 4.0–10.5)

## 2012-06-29 LAB — TYPE AND SCREEN
ABO/RH(D): A POS
Antibody Screen: NEGATIVE

## 2012-06-29 LAB — URIC ACID: Uric Acid, Serum: 5 mg/dL (ref 2.4–7.0)

## 2012-06-29 MED ORDER — MEASLES, MUMPS & RUBELLA VAC ~~LOC~~ INJ
0.5000 mL | INJECTION | Freq: Once | SUBCUTANEOUS | Status: DC
  Filled 2012-06-29: qty 0.5

## 2012-06-29 MED ORDER — ONDANSETRON HCL 4 MG/2ML IJ SOLN: 4.0000 mg | Freq: Four times a day (QID) | INTRAMUSCULAR | Status: DC | PRN

## 2012-06-29 MED ORDER — DIBUCAINE 1 % RE OINT
1.0000 "application " | TOPICAL_OINTMENT | RECTAL | Status: DC | PRN
  Filled 2012-06-29: qty 28

## 2012-06-29 MED ORDER — EPHEDRINE 5 MG/ML INJ
10.0000 mg | INTRAVENOUS | Status: DC | PRN
  Filled 2012-06-29: qty 4

## 2012-06-29 MED ORDER — DIPHENHYDRAMINE HCL 25 MG PO CAPS: 25.0000 mg | ORAL_CAPSULE | Freq: Four times a day (QID) | ORAL | Status: DC | PRN

## 2012-06-29 MED ORDER — LACTATED RINGERS IV SOLN: 500.0000 mL | INTRAVENOUS | Status: DC | PRN

## 2012-06-29 MED ORDER — FLEET ENEMA 7-19 GM/118ML RE ENEM: 1.0000 | ENEMA | Freq: Once | RECTAL | Status: DC

## 2012-06-29 MED ORDER — OXYTOCIN BOLUS FROM INFUSION
500.0000 mL | INTRAVENOUS | Status: DC
  Administered 2012-06-29: 500 mL via INTRAVENOUS

## 2012-06-29 MED ORDER — LIDOCAINE HCL (PF) 1 % IJ SOLN
INTRAMUSCULAR | Status: DC | PRN
  Administered 2012-06-29 (×2): 5 mL

## 2012-06-29 MED ORDER — TETANUS-DIPHTH-ACELL PERTUSSIS 5-2.5-18.5 LF-MCG/0.5 IM SUSP
0.5000 mL | Freq: Once | INTRAMUSCULAR | Status: DC
  Filled 2012-06-29: qty 0.5

## 2012-06-29 MED ORDER — SENNOSIDES-DOCUSATE SODIUM 8.6-50 MG PO TABS
2.0000 | ORAL_TABLET | Freq: Every day | ORAL | Status: DC
  Administered 2012-06-30: 2 via ORAL

## 2012-06-29 MED ORDER — FENTANYL 2.5 MCG/ML BUPIVACAINE 1/10 % EPIDURAL INFUSION (WH - ANES)
14.0000 mL/h | INTRAMUSCULAR | Status: DC | PRN
  Administered 2012-06-29: 14 mL/h via EPIDURAL
  Filled 2012-06-29: qty 125

## 2012-06-29 MED ORDER — PRENATAL MULTIVITAMIN CH
1.0000 | ORAL_TABLET | Freq: Every day | ORAL | Status: DC
Start: 2012-06-30 — End: 2012-07-01
  Administered 2012-06-30: 1 via ORAL
  Filled 2012-06-29: qty 1

## 2012-06-29 MED ORDER — LABETALOL HCL 5 MG/ML IV SOLN: 10.0000 mg | INTRAVENOUS | Status: DC | PRN

## 2012-06-29 MED ORDER — PHENYLEPHRINE 40 MCG/ML (10ML) SYRINGE FOR IV PUSH (FOR BLOOD PRESSURE SUPPORT): 80.0000 ug | PREFILLED_SYRINGE | INTRAVENOUS | Status: DC | PRN

## 2012-06-29 MED ORDER — MEDROXYPROGESTERONE ACETATE 150 MG/ML IM SUSP: 150.0000 mg | INTRAMUSCULAR | Status: DC | PRN

## 2012-06-29 MED ORDER — FLUOXETINE HCL 40 MG PO CAPS: 40.0000 mg | ORAL_CAPSULE | Freq: Every day | ORAL | Status: DC

## 2012-06-29 MED ORDER — LACTATED RINGERS IV SOLN: INTRAVENOUS | Status: DC

## 2012-06-29 MED ORDER — CITRIC ACID-SODIUM CITRATE 334-500 MG/5ML PO SOLN: 30.0000 mL | ORAL | Status: DC | PRN

## 2012-06-29 MED ORDER — ONDANSETRON HCL 4 MG/2ML IJ SOLN: 4.0000 mg | INTRAMUSCULAR | Status: DC | PRN

## 2012-06-29 MED ORDER — ONDANSETRON HCL 4 MG PO TABS: 4.0000 mg | ORAL_TABLET | ORAL | Status: DC | PRN

## 2012-06-29 MED ORDER — FLUOXETINE HCL 20 MG PO CAPS
40.0000 mg | ORAL_CAPSULE | Freq: Every day | ORAL | Status: DC
  Administered 2012-06-29 – 2012-06-30 (×2): 40 mg via ORAL
  Filled 2012-06-29 (×3): qty 2

## 2012-06-29 MED ORDER — LIDOCAINE HCL (PF) 1 % IJ SOLN
30.0000 mL | INTRAMUSCULAR | Status: DC | PRN
  Filled 2012-06-29: qty 30

## 2012-06-29 MED ORDER — MISOPROSTOL 200 MCG PO TABS
ORAL_TABLET | ORAL | Status: AC
  Administered 2012-06-29: 800 ug
  Filled 2012-06-29: qty 4

## 2012-06-29 MED ORDER — OXYTOCIN 40 UNITS IN LACTATED RINGERS INFUSION - SIMPLE MED
62.5000 mL/h | INTRAVENOUS | Status: DC
  Filled 2012-06-29: qty 1000

## 2012-06-29 MED ORDER — ZOLPIDEM TARTRATE 5 MG PO TABS
5.0000 mg | ORAL_TABLET | Freq: Every evening | ORAL | Status: DC | PRN
  Administered 2012-06-29: 5 mg via ORAL
  Filled 2012-06-29: qty 1

## 2012-06-29 MED ORDER — OXYCODONE-ACETAMINOPHEN 5-325 MG PO TABS: 1.0000 | ORAL_TABLET | ORAL | Status: DC | PRN

## 2012-06-29 MED ORDER — WITCH HAZEL-GLYCERIN EX PADS: 1.0000 "application " | MEDICATED_PAD | CUTANEOUS | Status: DC | PRN

## 2012-06-29 MED ORDER — PHENYLEPHRINE 40 MCG/ML (10ML) SYRINGE FOR IV PUSH (FOR BLOOD PRESSURE SUPPORT)
80.0000 ug | PREFILLED_SYRINGE | INTRAVENOUS | Status: DC | PRN
  Filled 2012-06-29: qty 5

## 2012-06-29 MED ORDER — OXYCODONE-ACETAMINOPHEN 5-325 MG PO TABS
1.0000 | ORAL_TABLET | ORAL | Status: DC | PRN
  Administered 2012-06-30 – 2012-07-01 (×2): 1 via ORAL
  Filled 2012-06-29 (×2): qty 1

## 2012-06-29 MED ORDER — DIPHENHYDRAMINE HCL 50 MG/ML IJ SOLN: 12.5000 mg | INTRAMUSCULAR | Status: DC | PRN

## 2012-06-29 MED ORDER — FLEET ENEMA 7-19 GM/118ML RE ENEM: 1.0000 | ENEMA | Freq: Every day | RECTAL | Status: DC | PRN

## 2012-06-29 MED ORDER — BISACODYL 10 MG RE SUPP
10.0000 mg | Freq: Every day | RECTAL | Status: DC | PRN
  Filled 2012-06-29: qty 1

## 2012-06-29 MED ORDER — ACETAMINOPHEN 325 MG PO TABS: 650.0000 mg | ORAL_TABLET | ORAL | Status: DC | PRN

## 2012-06-29 MED ORDER — IBUPROFEN 600 MG PO TABS
600.0000 mg | ORAL_TABLET | Freq: Four times a day (QID) | ORAL | Status: DC
  Administered 2012-06-29 – 2012-07-01 (×6): 600 mg via ORAL
  Filled 2012-06-29 (×7): qty 1

## 2012-06-29 MED ORDER — EPHEDRINE 5 MG/ML INJ: 10.0000 mg | INTRAVENOUS | Status: DC | PRN

## 2012-06-29 MED ORDER — BENZOCAINE-MENTHOL 20-0.5 % EX AERO
1.0000 "application " | INHALATION_SPRAY | CUTANEOUS | Status: DC | PRN
  Administered 2012-06-30: 1 via TOPICAL
  Filled 2012-06-29 (×2): qty 56

## 2012-06-29 MED ORDER — SIMETHICONE 80 MG PO CHEW: 80.0000 mg | CHEWABLE_TABLET | ORAL | Status: DC | PRN

## 2012-06-29 MED ORDER — LANOLIN HYDROUS EX OINT: TOPICAL_OINTMENT | CUTANEOUS | Status: DC | PRN

## 2012-06-29 MED ORDER — IBUPROFEN 600 MG PO TABS: 600.0000 mg | ORAL_TABLET | Freq: Four times a day (QID) | ORAL | Status: DC | PRN

## 2012-06-29 MED ORDER — LACTATED RINGERS IV SOLN
500.0000 mL | Freq: Once | INTRAVENOUS | Status: AC
  Administered 2012-06-29: 500 mL via INTRAVENOUS

## 2012-06-29 NOTE — Anesthesia Preprocedure Evaluation (Signed)
Anesthesia Evaluation  Patient identified by MRN, date of birth, ID band Patient awake    Reviewed: Allergy & Precautions, H&P , Patient's Chart, lab work & pertinent test results  Airway Mallampati: III TM Distance: >3 FB Neck ROM: full    Dental no notable dental hx.    Pulmonary neg pulmonary ROS,  breath sounds clear to auscultation  Pulmonary exam normal       Cardiovascular negative cardio ROS  Rhythm:regular Rate:Normal     Neuro/Psych  Headaches, PSYCHIATRIC DISORDERS Anxiety Depression  Neuromuscular disease negative neurological ROS  negative psych ROS   GI/Hepatic negative GI ROS, Neg liver ROS, GERD-  ,  Endo/Other  negative endocrine ROSMorbid obesity  Renal/GU negative Renal ROS     Musculoskeletal  (+) Fibromyalgia -  Abdominal   Peds  Hematology negative hematology ROS (+)   Anesthesia Other Findings History Date Comments History Date Comments    Bruises easily     IBD (inflammatory bowel disease)        Migraine     Cyst of ovary 2003      H/O sickle cell trait     Trichomonas        Breast mass in female 2003   Depression 2003      DUB (dysfunctional uterine bleeding) 2003   Menses, irregular 2003      BV (bacterial vaginosis)     Hx: UTI (urinary tract infection) 2003      Low back pain 2003   Obese 2003      Vaginitis and vulvovaginitis 2004   H/O amenorrhea 2006      GERD (gastroesophageal reflux disease) 2007   H/O menorrhagia 2011      Yeast vaginitis 04/24/2010   Anxiety   TAKING MEDS; DR Evelene Croon    Hypertension 2005 RESOLVED WITH WT LOSS Abnormal Pap smear 1997 LAST PAP 10/2010    Infection   OCC YEAST Anemia   CHRONIC    Fibromyalgia 2004   Fibroid   LONG TERM DX    Blood dyscrasia   SICKLE CELL TRAIT Heart murmur 2007      Arthritis     Sacroiliitis    Reproductive/Obstetrics (+) Pregnancy                           Anesthesia Physical Anesthesia Plan  ASA:  III  Anesthesia Plan: Epidural   Post-op Pain Management:    Induction:   Airway Management Planned:   Additional Equipment:   Intra-op Plan:   Post-operative Plan:   Informed Consent: I have reviewed the patients History and Physical, chart, labs and discussed the procedure including the risks, benefits and alternatives for the proposed anesthesia with the patient or authorized representative who has indicated his/her understanding and acceptance.     Plan Discussed with:   Anesthesia Plan Comments:         Anesthesia Quick Evaluation

## 2012-06-29 NOTE — Progress Notes (Signed)
  Subjective: More comfortable with epidural, still aware of contractions.  FOB traveling back from [place], patient's mother with her other children, so patient is alone at present.  Denies HA, visual sx, or epigastric pain.  Objective: BP 120/91  Pulse 108  Temp(Src) 98.4 F (36.9 C) (Oral)  Resp 20  Ht 5' 4.5" (1.638 m)  SpO2 97%  LMP 10/13/2011   Filed Vitals:   06/29/12 1806 06/29/12 1813 06/29/12 1815 06/29/12 1831  BP: 121/90 122/90 121/89 120/91  Pulse: 107 101 101 108  Temp:      TempSrc:      Resp: 16 20 18 20   Height:      SpO2: 97%      No hx HTN in this or past pregnancies.    FHT:  Reassuring, negative CST UC:   regular, every 4-5 minutes SVE:   Dilation: 7 Effacement (%): 100 Station: 0 Exam by:: V Keeyon Privitera CNM BBOW AROM after discussion with patient--she requested I go ahead and break the water. Light MSF noted. Foley cath inserted--will begin 24 hour urine  Assessment / Plan: Progressive labor Light MSF Elevated BP  Plan: UA to lab Start 24 hour urine for protein Add PIH labs to admit labs Labetalol prn for systolic > or = 160, diastolic > or = 105. Support to patient for laboring without family--"its not my first, so it's OK".  Nigel Bridgeman 06/29/2012, 6:49 PM

## 2012-06-29 NOTE — Anesthesia Procedure Notes (Signed)
Epidural Patient location during procedure: OB Start time: 06/29/2012 5:42 PM  Staffing Anesthesiologist: Angus Seller., Harrell Gave. Performed by: anesthesiologist   Preanesthetic Checklist Completed: patient identified, site marked, surgical consent, pre-op evaluation, timeout performed, IV checked, risks and benefits discussed and monitors and equipment checked  Epidural Patient position: sitting Prep: site prepped and draped and DuraPrep Patient monitoring: continuous pulse ox and blood pressure Approach: midline Injection technique: LOR air and LOR saline  Needle:  Needle type: Tuohy  Needle gauge: 17 G Needle length: 9 cm and 9 Needle insertion depth: 7 cm Catheter type: closed end flexible Catheter size: 19 Gauge Catheter at skin depth: 14 cm Test dose: negative  Assessment Events: blood not aspirated, injection not painful, no injection resistance, negative IV test and no paresthesia  Additional Notes Patient identified.  Risk benefits discussed including failed block, incomplete pain control, headache, nerve damage, paralysis, blood pressure changes, nausea, vomiting, reactions to medication both toxic or allergic, and postpartum back pain.  Patient expressed understanding and wished to proceed.  All questions were answered.  Sterile technique used throughout procedure and epidural site dressed with sterile barrier dressing. No paresthesia or other complications noted.The patient did not experience any signs of intravascular injection such as tinnitus or metallic taste in mouth nor signs of intrathecal spread such as rapid motor block. Please see nursing notes for vital signs.

## 2012-06-29 NOTE — Progress Notes (Signed)
Cuff removed for blood draw

## 2012-06-29 NOTE — H&P (Signed)
Tiffany [name] is a 37 y.o. female, [id number] at 37 5/7 weeks, presenting for contractions this afternoon--denies leaking or bleeding, reports +FM.  Denies HA, visual symptoms, or epigastric pain.  Cervix was 3 cm on last exam.  Reports depression is stable--on Xanax, Prozac, Adderall, Ambien.  Has been followed at [facility] for med exposures during pregnancy.  Patient Active Problem List  Diagnosis  . AMA (advanced maternal age) multigravida 35+  . Depression  . Fibromyalgia  . Arthritis  . IBS (irritable bowel syndrome)  . GERD (gastroesophageal reflux disease)  . Fibroids  . Vaginal yeast infection  . Irregular alveolar process  . Hx of laparoscopic gastric banding  . Preterm labor  . Active labor  . Medication exposure during first trimester of pregnancy (and continuing)  Hx hypertention before gastric banding procedure, but no BP issues since 2005.  History OB History   Grav Para Term Preterm Abortions TAB SAB Ect Mult Living   12 5 5  6  6   5     1990--SVB, 40 weeks, 1 hour labor, 8+4, no meds, female, WHG 5/92--SAB 12/92--SAB 1995--SAB 1996--SVB, 40 weeks, 2h 9m labor, 7lbs, epid, female, WHG 1997--SAB 2002--SVB, 42 weeks, 5 hour labor, 7+9, female, epid, in [place] 2003--SVB, 39 weeks, 2h , 6+13, female, Dr. Normand Sloop 2004--SAB 2005--SVB, 38 2/7 weeks, 6+10, no medication, female, Dr. Normand Sloop 2012--SAB, 9 weeks Past Medical History  Diagnosis Date  . Bruises easily   . IBD (inflammatory bowel disease)   . Migraine   . Cyst of ovary 2003  . H/O sickle cell trait   . Trichomonas   . Breast mass in female 2003  . Depression 2003  . DUB (dysfunctional uterine bleeding) 2003  . Menses, irregular 2003  . BV (bacterial vaginosis)   . Hx: UTI (urinary tract infection) 2003  . Low back pain 2003  . Obese 2003  . Vaginitis and vulvovaginitis 2004  . H/O amenorrhea 2006  . GERD (gastroesophageal reflux disease) 2007  . H/O menorrhagia 2011  . Yeast vaginitis 04/24/2010  .  Anxiety     TAKING MEDS; DR Evelene Croon  . Hypertension 2005    RESOLVED WITH WT LOSS  . Abnormal Pap smear 1997    LAST PAP 10/2010  . Infection     OCC YEAST  . Anemia     CHRONIC  . Fibromyalgia 2004  . Fibroid     LONG TERM DX  . Blood dyscrasia     SICKLE CELL TRAIT  . Heart murmur 2007  . Arthritis   . Sacroiliitis    Past Surgical History  Procedure Laterality Date  . Cystectomy      obtain date from patient, was not on history form dated 06/16/10  . Laparoscopic gastric banding  05/20/2007  . Cervical cone biopsy  1997  . Wisdom tooth extraction    . Dilation and curettage of uterus     Family History: family history includes Alcohol abuse in her father; Arthritis in her mother; Birth defects in her maternal aunt; COPD in her maternal grandfather; Cancer in her maternal grandmother and paternal grandmother; Depression in her maternal grandmother; Diabetes in her mother; Drug abuse in her father; Early death (age of onset: 17) in her maternal aunt; Fibromyalgia in her mother; HIV in her sister; Heart disease in her father; Hyperlipidemia in her mother; Hypertension in her father; Mental illness in her maternal uncle; Miscarriages / Stillbirths in her maternal grandmother; Multiple sclerosis in her brother; Muscular dystrophy in  her brother; Other in her mother; and Stroke in her father and maternal grandfather.  Social History:  reports that she has never smoked. She has never used smokeless tobacco. She reports that she drinks about 0.6 ounces of alcohol per week. She reports that she does not use illicit drugs. FOB is involved and supportive, but not present with her today.  Patient has been maintained on depression meds during this pregnancy and followed by Dr. Evelene Croon for med management.     Prenatal Transfer Tool  Maternal Diabetes: No Genetic Screening: Normal Maternal Ultrasounds/Referrals: Normal--referred to MFM due to several psych meds during pregnancy (Cymbalta, Abilify in  past--currently on Ambien, Xanax, Prozac, Adderall)--normal growth and development. Fetal Ultrasounds or other Referrals:  None regarding fetal issues (see above) Maternal Substance Abuse:  No Significant Maternal Medications:  Meds include: Other: See above for psych med use. Significant Maternal Lab Results:  Lab values include: Group B Strep negative Other Comments:  +FFN at 31 weeks, received Betamethasone  ROS:  Contractions, +FM  Dilation: 7 Effacement (%): 100 Station: 0 Exam by:: V Thedore Pickel CNM Blood pressure 126/93, pulse 97, temperature 98.4 F (36.9 C), temperature source Oral, resp. rate 16, height 5' 4.5" (1.638 m), last menstrual period 10/13/2011, SpO2 97.00%.  Exam Physical Exam  Chest clear Heart RRR without murmur Abd gravid, NT Pelvic--as above, with BBOW on admission Ext DTR 2+, 1+ edema, no clonus  FHR Reassuring, negative CST UCs q 4-6 min, mild/moderate  Prenatal labs: ABO, Rh: --/--/A POS (03/23 1800) Antibody: NEG (03/23 1800) Rubella: 214.1 (08/30 1208) RPR: NON REAC (01/07 0933)  HBsAg: NEGATIVE (08/30 1208)  HIV: NON REACTIVE (08/30 1208)  GBS: Negative (03/04 0000)  Hgb 10.8 at NOB, 10.3 in January FFN + 05/18/11 Glucola WNL x 2 Hgb electrophoresis WNL Pap WNL at NOB 12/2011 Cultures negative 3/4, 2/8, and at NOB [facility] WNL  Assessment/Plan: IUP at 37 5/7 weeks Active labor GBS negative Depression--stable on meds Elevated BP  Plan: Admitted to [facility] per consult with Dr. Estanislado Pandy Routine [facility] orders Plans epidural. Will place foley after epidural placed--start 24 hour urine for protein. Anticipate AROM as appropriate. PIH labs Labetalol prn for elevated BP per parameters. Support to patient in the absence of family support during labor SW consult pp. Will need to continue depression meds pp.  Nigel Bridgeman 06/29/2012, 7:30 PM

## 2012-06-29 NOTE — Progress Notes (Signed)
Foley catheter remains in place after delivery to continue 24 hour urine, secondary to mildly elevated BP's since admission To be completed at 1850 on 06-30-12

## 2012-06-29 NOTE — MAU Note (Signed)
Pt has had contractions all day and they have become 5-7 min apart with pelvic/rectal pressure and a thick brown/pink discharge when she wipes.  Feels the baby moving well.

## 2012-06-30 LAB — CBC
MCV: 75.5 fL — ABNORMAL LOW (ref 78.0–100.0)
Platelets: 191 10*3/uL (ref 150–400)
RBC: 3.39 MIL/uL — ABNORMAL LOW (ref 3.87–5.11)
WBC: 12.3 10*3/uL — ABNORMAL HIGH (ref 4.0–10.5)

## 2012-06-30 LAB — RPR: RPR Ser Ql: NONREACTIVE

## 2012-06-30 MED ORDER — FERROUS SULFATE 325 (65 FE) MG PO TABS
325.0000 mg | ORAL_TABLET | Freq: Two times a day (BID) | ORAL | Status: DC
  Administered 2012-06-30 – 2012-07-01 (×3): 325 mg via ORAL
  Filled 2012-06-30 (×5): qty 1

## 2012-06-30 NOTE — Progress Notes (Signed)

## 2012-06-30 NOTE — Anesthesia Postprocedure Evaluation (Signed)
  Anesthesia Post-op Note  Patient: Tiffany [name]  Procedure(s) Performed: * No procedures listed *  Patient Location: Mother/Baby  Anesthesia Type:Epidural  Level of Consciousness: awake  Airway and Oxygen Therapy: Patient Spontanous Breathing  Post-op Pain: none  Post-op Assessment: Patient's Cardiovascular Status Stable, Respiratory Function Stable, Patent Airway, No signs of Nausea or vomiting, Adequate PO intake, Pain level controlled, No headache, No backache, No residual numbness and No residual motor weakness  Post-op Vital Signs: Reviewed and stable  Complications: No apparent anesthesia complications

## 2012-06-30 NOTE — Progress Notes (Signed)
UR chart review completed.  

## 2012-06-30 NOTE — Progress Notes (Signed)
Post Partum Day 1:S/P SVB, no lacerations Subjective: Patient up ad lib, denies syncope or dizziness.  Denies depression, HA, visual sx, or epigastric pain. Feeding:  Bottle Contraceptive plan:   Nexplanon  Objective: Blood pressure 122/83, pulse 106, temperature 98.7 F (37.1 C), temperature source Oral, resp. rate 18, height 5' 4.5" (1.638 m), weight 212 lb (96.163 kg), last menstrual period 10/13/2011, SpO2 98.00%, unknown if currently breastfeeding.  Filed Vitals:   06/29/12 2116 06/29/12 2131 06/29/12 2330 06/30/12 0620  BP: 125/89 131/92 129/86 122/83  Pulse: 93 89 97 106  Temp:   99.2 F (37.3 C) 98.7 F (37.1 C)  TempSrc:   Oral Oral  Resp: 18  18 18   Height:      Weight:      SpO2:       24 hour urine in process--will complete at 6:50pm.  Physical Exam:  General: alert Lochia: appropriate Uterine Fundus: firm Incision: Perineum clear DVT Evaluation: No evidence of DVT seen on physical exam. Negative Homan's sign.   Recent Labs  06/29/12 1630 06/30/12 0654  HGB 10.6* 8.8*  HCT 31.6* 25.6*    Assessment/Plan: S/P Vaginal delivery day 1 Anemia without hemodynamic compromise.  Orthostatics CBC tomorrow FeSO4 BID Continue current care Anticipate d/c tomorrow.    LOS: 1 day   Nigel Bridgeman 06/30/2012, 8:52 AM

## 2012-07-01 LAB — PROTEIN, URINE, 24 HOUR: Collection Interval-UPROT: 24 hours

## 2012-07-01 LAB — CBC
HCT: 26.9 % — ABNORMAL LOW (ref 36.0–46.0)
Hemoglobin: 9 g/dL — ABNORMAL LOW (ref 12.0–15.0)
MCHC: 33.5 g/dL (ref 30.0–36.0)
WBC: 9.3 10*3/uL (ref 4.0–10.5)

## 2012-07-01 MED ORDER — IBUPROFEN 600 MG PO TABS: 600.0000 mg | ORAL_TABLET | Freq: Four times a day (QID) | ORAL | Status: DC

## 2012-07-01 NOTE — Discharge Summary (Signed)
  Vaginal Delivery Discharge Summary  Tiffany [name]  DOB:    04-19-1975 MRN:    [id number] CSN:    [id number]  Date of admission:                  06/29/12  Date of discharge:                   07/01/12  Procedures this admission:  NSVD no laceractions  Newborn Data:  Live born female  Birth Weight: 7 lb 3.3 oz (3269 g) APGAR: 9, 9  Home with mother.  History of Present Illness:  [name] is a 37 y.o. female, [phone], who presents at [redacted]w[redacted]d weeks gestation. The patient has been followed at the [facility] and Gynecology division of [facility]. She was admitted onset of labor. Her pregnancy has been complicated by:   Patient Active Problem List  Diagnosis  . AMA (advanced maternal age) multigravida 35+  . Depression  . Fibromyalgia  . Arthritis  . IBS (irritable bowel syndrome)  . GERD (gastroesophageal reflux disease)  . Fibroids  . Irregular alveolar process  . Hx of laparoscopic gastric banding  . Medication exposure during first trimester of pregnancy (and continuing)  . NSVD (normal spontaneous vaginal delivery)  . gestational hypertension    Hospital course:  The patient was admitted for onset of labor.   Her labor was not complicated, however she had elevated BPs; PIH labs and 24 hour urine collected and WNL. She proceeded to have a vaginal delivery of a healthy infant. Her delivery was not complicated. Her postpartum course was not complicated; all BP were WNL.   She was discharged to home on postpartum day 2 doing well.  Feeding:  bottle  Contraception:  Nexplanon  Discharge hemoglobin:  HGB  Date Value Range Status  08/06/2008 12.7  11.6 - 15.9 g/dL Final     Hemoglobin  Date Value Range Status  07/01/2012 9.0* 12.0 - 15.0 g/dL Final     HCT  Date Value Range Status  07/01/2012 26.9* 36.0 - 46.0 % Final  08/06/2008 37.5  34.8 - 46.6 % Final    Discharge Physical Exam:   General: alert Lochia:  appropriate Uterine Fundus: firm Incision: n/a DVT Evaluation: No evidence of DVT seen on physical exam. Negative Homan's sign.  Intrapartum Procedures: spontaneous vaginal delivery and PIH labs and 24 hour urine Postpartum Procedures: none Complications-Operative and Postpartum: none  Discharge Diagnoses: Term Pregnancy-delivered and elevated BP during labor; mild anemia  Discharge Information:  Activity:           per [facility] handbook Diet:                routine Medications: PNV and Ibuprofen Condition:      stable Instructions:  refer to practice specific booklet Discharge to: home  [employer] nurse visit in 2 weeks for BP check Ec[employer] iron rich foods   Follow-up Information   Follow up with [facility]. Schedule an appointment as soon as possible for a visit in 5 weeks. (Call with any questions or concerns)    Contact information:   [address] [place] [address] [phone]      Haroldine Laws CNM, MSN 07/01/2012

## 2012-07-02 ENCOUNTER — Encounter (INDEPENDENT_AMBULATORY_CARE_PROVIDER_SITE_OTHER): Payer: Self-pay | Admitting: General Surgery

## 2012-07-03 ENCOUNTER — Ambulatory Visit (INDEPENDENT_AMBULATORY_CARE_PROVIDER_SITE_OTHER): Payer: 59 | Admitting: Physician Assistant

## 2012-07-03 ENCOUNTER — Encounter (INDEPENDENT_AMBULATORY_CARE_PROVIDER_SITE_OTHER): Payer: Self-pay

## 2012-07-03 VITALS — BP 106/80 | HR 114 | Temp 97.8°F | Resp 16 | Ht 65.0 in | Wt 215.6 lb

## 2012-07-03 DIAGNOSIS — Z4651 Encounter for fitting and adjustment of gastric lap band: Secondary | ICD-10-CM

## 2012-07-03 NOTE — Patient Instructions (Signed)
Take clear liquids tonight. Thin protein shakes are ok to start tomorrow morning. Slowly advance your diet thereafter. Call us if you have persistent vomiting or regurgitation, night cough or reflux symptoms. Return as scheduled or sooner if you notice no changes in hunger/portion sizes.  

## 2012-07-03 NOTE — Progress Notes (Addendum)
  HISTORY: Tiffany [name] is a 37 y.o.female who received an AP-Standard lap-band in February 2009 by Dr. Johna Sheriff. She comes in 4 days post-partum. I had removed fluid in November during her pregnancy for vomiting. She wants to get back on track with weight loss now that she's delivered.  VITAL SIGNS: Filed Vitals:   07/03/12 1413  BP: 106/80  Pulse: 114  Temp: 97.8 F (36.6 C)  Resp: 16    PHYSICAL EXAM: Physical exam reveals a very well-appearing 37 y.o.female in no apparent distress Neurologic: Awake, alert, oriented Psych: Bright affect, conversant Respiratory: Breathing even and unlabored. No stridor or wheezing Abdomen: Soft, nontender, nondistended to palpation. Incisions well-healed. No incisional hernias. Port easily palpated. Extremities: Atraumatic, good range of motion.  ASSESMENT: 37 y.o.  female  s/p AP-Standard lap-band. Replacement of fluid is medically necessary for resumption of weight loss.  PLAN: The patient's port was accessed with a 20G Huber needle without difficulty. Clear fluid was aspirated and 0.5 mL saline was added to the port to give a total predicted volume of 7.25 mL. The patient was able to swallow water without difficulty following the procedure and was instructed to take clear liquids for the next 24-48 hours and advance slowly as tolerated.

## 2012-07-15 ENCOUNTER — Encounter: Payer: Self-pay | Admitting: Obstetrics and Gynecology

## 2012-10-22 ENCOUNTER — Other Ambulatory Visit: Payer: Self-pay | Admitting: Nurse Practitioner

## 2012-10-24 ENCOUNTER — Other Ambulatory Visit: Payer: Self-pay | Admitting: Nurse Practitioner

## 2012-10-24 DIAGNOSIS — R109 Unspecified abdominal pain: Secondary | ICD-10-CM

## 2012-10-25 ENCOUNTER — Ambulatory Visit
Admission: RE | Admit: 2012-10-25 | Discharge: 2012-10-25 | Disposition: A | Discharge status: Home / Self Care | Payer: Medicaid Other | Source: Ambulatory Visit | Attending: Nurse Practitioner | Admitting: Nurse Practitioner

## 2012-10-25 DIAGNOSIS — R109 Unspecified abdominal pain: Secondary | ICD-10-CM

## 2012-10-25 MED ORDER — GADOBENATE DIMEGLUMINE 529 MG/ML IV SOLN: 17.0000 mL | Freq: Once | INTRAVENOUS | Status: DC | PRN

## 2012-10-25 MED ORDER — GADOBENATE DIMEGLUMINE 529 MG/ML IV SOLN
17.0000 mL | Freq: Once | INTRAVENOUS | Status: AC | PRN
  Administered 2012-10-25: 17 mL via INTRAVENOUS

## 2012-10-26 ENCOUNTER — Other Ambulatory Visit: Payer: Medicaid Other

## 2013-01-29 ENCOUNTER — Encounter (INDEPENDENT_AMBULATORY_CARE_PROVIDER_SITE_OTHER): Payer: Commercial Indemnity

## 2013-02-05 ENCOUNTER — Encounter (INDEPENDENT_AMBULATORY_CARE_PROVIDER_SITE_OTHER): Payer: 59

## 2013-02-12 ENCOUNTER — Encounter (INDEPENDENT_AMBULATORY_CARE_PROVIDER_SITE_OTHER): Payer: Commercial Indemnity

## 2013-02-12 ENCOUNTER — Encounter (INDEPENDENT_AMBULATORY_CARE_PROVIDER_SITE_OTHER): Payer: Self-pay

## 2013-02-12 ENCOUNTER — Ambulatory Visit (INDEPENDENT_AMBULATORY_CARE_PROVIDER_SITE_OTHER): Payer: Commercial Indemnity | Admitting: Physician Assistant

## 2013-02-12 VITALS — BP 110/90 | HR 64 | Temp 98.8°F | Resp 15 | Ht 65.0 in | Wt 195.2 lb

## 2013-02-12 DIAGNOSIS — Z4651 Encounter for fitting and adjustment of gastric lap band: Secondary | ICD-10-CM

## 2013-02-12 NOTE — Patient Instructions (Signed)

## 2013-02-12 NOTE — Progress Notes (Addendum)
  HISTORY: Tiffany [name] is a 37 y.o.female who received an AP-STandard lap-band in February 2009 by Dr. Johna Sheriff. She comes in with 20 lbs weight loss since March. She has had good results until recently when she began experiencing increased portions and hunger. She said she is able to eat an entire plate of food without difficulty. She has no complaint of regurgitation or reflux. She would like a fill.  VITAL SIGNS: Filed Vitals:   02/12/13 1428  BP: 110/90  Pulse: 64  Temp: 98.8 F (37.1 C)  Resp: 15    PHYSICAL EXAM: Physical exam reveals a very well-appearing 37 y.o.female in no apparent distress Neurologic: Awake, alert, oriented Psych: Bright affect, conversant Respiratory: Breathing even and unlabored. No stridor or wheezing Abdomen: Soft, nontender, nondistended to palpation. Incisions well-healed. No incisional hernias. Port easily palpated. Extremities: Atraumatic, good range of motion.  ASSESMENT: 37 y.o.  female  s/p AP-Standard lap-band.   PLAN: The patient's port was accessed with a 20G Huber needle without difficulty. Clear fluid was aspirated and 0.25 mL saline was added to the port to give a total predicted volume of 7.5 mL, after confirmation of 7.25 mL being in the band. The patient was able to swallow water without difficulty following the procedure and was instructed to take clear liquids for the next 24-48 hours and advance slowly as tolerated.   The abovementioned procedure is medically necessary for weight loss in the setting of lap-band.

## 2013-04-14 ENCOUNTER — Encounter: Payer: Self-pay | Admitting: Podiatry

## 2013-04-14 ENCOUNTER — Ambulatory Visit (INDEPENDENT_AMBULATORY_CARE_PROVIDER_SITE_OTHER): Payer: Managed Care, Other (non HMO) | Admitting: Podiatry

## 2013-04-14 VITALS — BP 113/81 | HR 110 | Resp 16 | Ht 65.0 in | Wt 176.0 lb

## 2013-04-14 DIAGNOSIS — L608 Other nail disorders: Secondary | ICD-10-CM

## 2013-04-14 NOTE — Progress Notes (Signed)
   Subjective:    Patient ID: Tiffany [name], female    DOB: Oct 26, 1975, 38 y.o.   MRN: [id number]  HPI Comments: "I think I have a toenail fungus"  Pt states that she has noticed that bilateral great toenails are getting thick and discolored for about 2-3 weeks now. She had a pedicure about 1 month ago and thinks maybe she picked up something from that. The left great is tender. She has not tried any treatments.      Review of Systems  HENT: Positive for sinus pressure.   Gastrointestinal: Positive for constipation.  Endocrine: Positive for cold intolerance.  Genitourinary: Positive for frequency.  Skin:       Change in nails  Allergic/Immunologic: Positive for environmental allergies.  Hematological: Bruises/bleeds easily.  All other systems reviewed and are negative.       Objective:   Physical Exam: I have reviewed her past medical history medications allergies surgeries social history and review of systems. Vital signs are stable she is alert and oriented x3. Pulses are palpable bilateral. Neurologic sensorium is intact bilateral. Deep tendon reflexes are intact bilateral. Muscle strength is 5 over 5 dorsiflexors plantar flexors inverters everters all intrinsic musculature is intact. Orthopedic evaluation demonstrates hammertoe deformities flexible in nature bilateral with mild hallux abductovalgus deformities bilateral better a symptomatic. Cutaneous evaluation demonstrates supple well hydrated cutis no erythema edema saline is drainage or odor the nail plates do demonstrate distal onycholysis with possible mycotic infection        Assessment & Plan:  Assessment: Possible mycotic infection rule out nail dystrophy.  Plan: Samples of the nail and skin sent from each hallux to the lab for assessment and culture.

## 2013-04-14 NOTE — Patient Instructions (Signed)

## 2013-04-16 ENCOUNTER — Telehealth: Payer: Self-pay | Admitting: *Deleted

## 2013-04-16 NOTE — Telephone Encounter (Signed)
B/L 1st toenail fragments sent to [facility] [phone] r/o fungal infection - specimens collected 04/14/213.

## 2013-05-27 ENCOUNTER — Encounter: Payer: Self-pay | Admitting: Podiatry

## 2013-06-10 ENCOUNTER — Telehealth: Payer: Self-pay | Admitting: *Deleted

## 2013-06-10 NOTE — Telephone Encounter (Signed)
Dr Milinda Pointer states fungal of 04/14/2013 is negative for fungus, may try [employer].  Informed pt of Dr Stephenie Acres orders, pt states will think about it and call again.

## 2014-02-08 ENCOUNTER — Encounter: Payer: Self-pay | Admitting: Podiatry

## 2014-10-08 DIAGNOSIS — I82409 Acute embolism and thrombosis of unspecified deep veins of unspecified lower extremity: Secondary | ICD-10-CM

## 2014-10-08 DIAGNOSIS — I2699 Other pulmonary embolism without acute cor pulmonale: Secondary | ICD-10-CM

## 2014-10-08 HISTORY — DX: Other pulmonary embolism without acute cor pulmonale: I26.99

## 2014-10-08 HISTORY — DX: Acute embolism and thrombosis of unspecified deep veins of unspecified lower extremity: I82.409

## 2014-11-09 ENCOUNTER — Ambulatory Visit (HOSPITAL_BASED_OUTPATIENT_CLINIC_OR_DEPARTMENT_OTHER): Payer: Self-pay | Admitting: Family Medicine

## 2014-11-09 ENCOUNTER — Encounter: Payer: Self-pay | Admitting: Family Medicine

## 2014-11-09 ENCOUNTER — Ambulatory Visit: Payer: Self-pay | Attending: Internal Medicine

## 2014-11-09 VITALS — BP 120/78 | HR 89 | Temp 98.6°F | Resp 16 | Ht 65.0 in | Wt 172.0 lb

## 2014-11-09 DIAGNOSIS — M797 Fibromyalgia: Secondary | ICD-10-CM | POA: Insufficient documentation

## 2014-11-09 DIAGNOSIS — D573 Sickle-cell trait: Secondary | ICD-10-CM | POA: Insufficient documentation

## 2014-11-09 DIAGNOSIS — F32A Depression, unspecified: Secondary | ICD-10-CM

## 2014-11-09 DIAGNOSIS — M79605 Pain in left leg: Secondary | ICD-10-CM | POA: Insufficient documentation

## 2014-11-09 DIAGNOSIS — Z9884 Bariatric surgery status: Secondary | ICD-10-CM | POA: Insufficient documentation

## 2014-11-09 DIAGNOSIS — K219 Gastro-esophageal reflux disease without esophagitis: Secondary | ICD-10-CM | POA: Insufficient documentation

## 2014-11-09 DIAGNOSIS — F329 Major depressive disorder, single episode, unspecified: Secondary | ICD-10-CM

## 2014-11-09 DIAGNOSIS — M79604 Pain in right leg: Secondary | ICD-10-CM | POA: Insufficient documentation

## 2014-11-09 LAB — VITAMIN B12: Vitamin B-12: 333 pg/mL (ref 211–911)

## 2014-11-09 MED ORDER — GABAPENTIN 300 MG PO CAPS: 300.0000 mg | ORAL_CAPSULE | Freq: Every day | ORAL | Status: DC

## 2014-11-09 NOTE — Progress Notes (Signed)
Establsh care Complaining of Lt leg pain x 2 week no Hx injury

## 2014-11-09 NOTE — Patient Instructions (Addendum)
Tiffany [name],  Thank you for coming in today  1. L leg pain x 2 weeks I am suspicious of radicular pain starting in your low back this can be due to disk disease or narrowing of the joint space Evaluation: Vit D, vit B12, x-ray of low back  D-dimer to rule out blood clot   Start gabapentin 300 mg at night for pain  2. Depression: Accessible mental health services are available at [facility] of [place], [place] and [place].  You will be called with lab and imaging results  Follow up in 4 weeks for L leg pain    Dr. Adrian Blackwater   Radicular Pain Radicular pain in either the arm or leg is usually from a bulging or herniated disk in the spine. A piece of the herniated disk may press against the nerves as the nerves exit the spine. This causes pain which is felt at the tips of the nerves down the arm or leg. Other causes of radicular pain may include:  Fractures.  Heart disease.  Cancer.  An abnormal and usually degenerative state of the nervous system or nerves (neuropathy). Diagnosis may require CT or MRI scanning to determine the primary cause.  Nerves that start at the neck (nerve roots) may cause radicular pain in the outer shoulder and arm. It can spread down to the thumb and fingers. The symptoms vary depending on which nerve root has been affected. In most cases radicular pain improves with conservative treatment. Neck problems may require physical therapy, a neck collar, or cervical traction. Treatment may take many weeks, and surgery may be considered if the symptoms do not improve.  Conservative treatment is also recommended for sciatica. Sciatica causes pain to radiate from the lower back or buttock area down the leg into the foot. Often there is a history of back problems. Most patients with sciatica are better after 2 to 4 weeks of rest and other supportive care. Short term bed rest can reduce the disk pressure considerably. Sitting, however, is not a good position since this  increases the pressure on the disk. You should avoid bending, lifting, and all other activities which make the problem worse. Traction can be used in severe cases. Surgery is usually reserved for patients who do not improve within the first months of treatment. Only take over-the-counter or prescription medicines for pain, discomfort, or fever as directed by your caregiver. Narcotics and muscle relaxants may help by relieving more severe pain and spasm and by providing mild sedation. Cold or massage can give significant relief. Spinal manipulation is not recommended. It can increase the degree of disc protrusion. Epidural steroid injections are often effective treatment for radicular pain. These injections deliver medicine to the spinal nerve in the space between the protective covering of the spinal cord and back bones (vertebrae). Your caregiver can give you more information about steroid injections. These injections are most effective when given within two weeks of the onset of pain.  You should see your caregiver for follow up care as recommended. A program for neck and back injury rehabilitation with stretching and strengthening exercises is an important part of management.  SEEK IMMEDIATE MEDICAL CARE IF:  You develop increased pain, weakness, or numbness in your arm or leg.  You develop difficulty with bladder or bowel control.  You develop abdominal pain. Document Released: 05/03/2004 Document Revised: 06/18/2011 Document Reviewed: 07/19/2008 [facility] Patient Information 2015 [place], [place]. This information is not intended to replace advice given to you by your  health care provider. Make sure you discuss any questions you have with your health care provider.

## 2014-11-09 NOTE — Assessment & Plan Note (Addendum)
  1. L leg pain x 2 weeks I am suspicious of radicular pain starting in your low back this can be due to disk disease or narrowing of the joint space Evaluation: Vit D, vit B12, x-ray of low back  D-dimer to rule out blood clot   Start gabapentin 300 mg at night for pain  D dimer elevated, LE venous doppler ordered

## 2014-11-09 NOTE — Assessment & Plan Note (Signed)
.   Depression: Accessible mental health services are available at [facility] of [place], [place] and [place].

## 2014-11-09 NOTE — Progress Notes (Signed)
   Subjective:    Patient ID: Tiffany [name], female    DOB: [birth year], 39 y.o.   MRN: [id number] CC: establish care, R leg pain  HPI 39 yo F presents to establish care and discuss the following:  1. R leg pain: x 2 weeks. R calf radiating up to R quad. Pain associated with numbness. No injury. Patient did fly to [place] and back in 48 hrs 4 weeks ago. No CP or SOB. No low back pain. No similar symptoms previously.  2. Depression: chronic. Previously managed by psychiatry. Prescribed multiple medications.  Has been without psychiatric care for 4 months.   History  Substance Use Topics  . Smoking status: Never Smoker   . Smokeless tobacco: Never Used  . Alcohol Use: 0.6 oz/week    1 Glasses of wine per week     Comment: a glass of wine occasionally during pregnancy   Past Medical History  Diagnosis Date  . Bruises easily   . IBD (inflammatory bowel disease)   . Migraine   . Cyst of ovary 2003  . H/O sickle cell trait   . Trichomonas   . Breast mass in female 2003  . Depression 2003  . DUB (dysfunctional uterine bleeding) 2003  . Menses, irregular 2003  . BV (bacterial vaginosis)   . Hx: UTI (urinary tract infection) 2003  . Low back pain 2003  . Obese 2003  . Vaginitis and vulvovaginitis 2004  . H/O amenorrhea 2006  . GERD (gastroesophageal reflux disease) 2007  . H/O menorrhagia 2011  . Yeast vaginitis 04/24/2010  . Anxiety     TAKING MEDS; [facility]  . Hypertension 2005    RESOLVED WITH WT LOSS  . Abnormal Pap smear 1997    LAST PAP 10/2010  . Infection     OCC YEAST  . Anemia     CHRONIC  . Fibromyalgia 2004  . Fibroid     LONG TERM DX  . Blood dyscrasia     SICKLE CELL TRAIT  . Heart murmur 2007  . Arthritis   . Sacroiliitis    surg Hx: gastric bypass surgery  Review of Systems  Constitutional: Negative for fever and chills.  Respiratory: Negative for shortness of breath.   Cardiovascular: Negative for chest pain.  Gastrointestinal: Negative for abdominal  pain and blood in stool.  Musculoskeletal: Positive for myalgias.  Skin: Negative for rash.  Psychiatric/Behavioral: Positive for sleep disturbance and dysphoric mood. Negative for suicidal ideas. The patient is nervous/anxious.   GAd-7; score of 19. 1 to 5. All others 3.      Objective:   Physical Exam ,BP 120/78 mmHg  Pulse 89  Temp(Src) 98.6 F (37 C) (Oral)  Resp 16  Ht 5\' 5"  (1.651 m)  Wt 172 lb (78.019 kg)  BMI 28.62 kg/m2  SpO2 100%  LMP 10/08/2014 General appearance: alert, cooperative and no distress Lungs: clear to auscultation bilaterally Heart: regular rate and rhythm, S1, S2 normal, no murmur, click, rub or gallop  Back Exam: Back: Normal Curvature, no deformities or CVA tenderness  Paraspinal Tenderness: absent   LE Strength 5/5  LE Sensation: in tact  LE Reflexes 2+ and symmetric  Straight leg raise: negative  Extremities: extremities normal, atraumatic, no cyanosis or edema     Assessment & Plan:

## 2014-11-10 LAB — D-DIMER, QUANTITATIVE (NOT AT ARMC): D DIMER QUANT: 0.88 ug{FEU}/mL — AB (ref 0.00–0.48)

## 2014-11-10 LAB — VITAMIN D 25 HYDROXY (VIT D DEFICIENCY, FRACTURES): VIT D 25 HYDROXY: 30 ng/mL (ref 30–100)

## 2014-11-10 NOTE — Addendum Note (Signed)
Addended by: Boykin Nearing on: 11/10/2014 09:11 AM   Modules accepted: Orders

## 2014-11-13 ENCOUNTER — Encounter (HOSPITAL_COMMUNITY): Payer: Self-pay | Admitting: *Deleted

## 2014-11-13 ENCOUNTER — Emergency Department (HOSPITAL_COMMUNITY)
Admission: EM | Admit: 2014-11-13 | Discharge: 2014-11-13 | Disposition: A | Discharge status: Home / Self Care | Payer: Medicaid Other | Attending: Emergency Medicine | Admitting: Emergency Medicine

## 2014-11-13 DIAGNOSIS — Z8742 Personal history of other diseases of the female genital tract: Secondary | ICD-10-CM | POA: Insufficient documentation

## 2014-11-13 DIAGNOSIS — G43909 Migraine, unspecified, not intractable, without status migrainosus: Secondary | ICD-10-CM | POA: Insufficient documentation

## 2014-11-13 DIAGNOSIS — Z8719 Personal history of other diseases of the digestive system: Secondary | ICD-10-CM | POA: Insufficient documentation

## 2014-11-13 DIAGNOSIS — Z862 Personal history of diseases of the blood and blood-forming organs and certain disorders involving the immune mechanism: Secondary | ICD-10-CM | POA: Insufficient documentation

## 2014-11-13 DIAGNOSIS — F419 Anxiety disorder, unspecified: Secondary | ICD-10-CM | POA: Insufficient documentation

## 2014-11-13 DIAGNOSIS — Z91040 Latex allergy status: Secondary | ICD-10-CM | POA: Insufficient documentation

## 2014-11-13 DIAGNOSIS — F329 Major depressive disorder, single episode, unspecified: Secondary | ICD-10-CM | POA: Insufficient documentation

## 2014-11-13 DIAGNOSIS — Z79899 Other long term (current) drug therapy: Secondary | ICD-10-CM | POA: Insufficient documentation

## 2014-11-13 DIAGNOSIS — M7542 Impingement syndrome of left shoulder: Secondary | ICD-10-CM

## 2014-11-13 DIAGNOSIS — Z87440 Personal history of urinary (tract) infections: Secondary | ICD-10-CM | POA: Insufficient documentation

## 2014-11-13 DIAGNOSIS — R011 Cardiac murmur, unspecified: Secondary | ICD-10-CM | POA: Insufficient documentation

## 2014-11-13 DIAGNOSIS — Z8619 Personal history of other infectious and parasitic diseases: Secondary | ICD-10-CM | POA: Insufficient documentation

## 2014-11-13 DIAGNOSIS — E669 Obesity, unspecified: Secondary | ICD-10-CM | POA: Insufficient documentation

## 2014-11-13 DIAGNOSIS — Z86018 Personal history of other benign neoplasm: Secondary | ICD-10-CM | POA: Insufficient documentation

## 2014-11-13 DIAGNOSIS — I10 Essential (primary) hypertension: Secondary | ICD-10-CM | POA: Insufficient documentation

## 2014-11-13 MED ORDER — IBUPROFEN 800 MG PO TABS: 800.0000 mg | ORAL_TABLET | Freq: Three times a day (TID) | ORAL | Status: DC

## 2014-11-13 NOTE — ED Provider Notes (Signed)
CSN: [id number]     Arrival date & time 11/13/14  0817 History   First MD Initiated Contact with Patient 11/13/14 [phone]     Chief Complaint  Patient presents with  . Arm Injury     (Consider location/radiation/quality/duration/timing/severity/associated sxs/prior Treatment) HPI Comments: Patient presents to the emergency department with chief complaint of left arm pain. She complains of some numbness and tingling that radiates down the side of her arm and into her ring and small finger. She states the symptoms started on Thursday. She states that she has been doing treated by her primary care doctor for nerve pain with gabapentin. Patient complains of some mild neck pain. She denies any fevers chills. She has been taking the gabapentin and ibuprofen with no relief. Symptoms are aggravated with movement. She denies any mechanism of injury.  The history is provided by the patient. No language interpreter was used.    Past Medical History  Diagnosis Date  . Bruises easily   . IBD (inflammatory bowel disease)   . Migraine   . Cyst of ovary 2003  . H/O sickle cell trait   . Trichomonas   . Breast mass in female 2003  . Depression 2003  . DUB (dysfunctional uterine bleeding) 2003  . Menses, irregular 2003  . BV (bacterial vaginosis)   . Hx: UTI (urinary tract infection) 2003  . Low back pain 2003  . Obese 2003  . Vaginitis and vulvovaginitis 2004  . H/O amenorrhea 2006  . GERD (gastroesophageal reflux disease) 2007  . H/O menorrhagia 2011  . Yeast vaginitis 04/24/2010  . Anxiety     TAKING MEDS; DR Toy Care  . Hypertension 2005    RESOLVED WITH WT LOSS  . Abnormal Pap smear 1997    LAST PAP 10/2010  . Infection     OCC YEAST  . Anemia     CHRONIC  . Fibromyalgia 2004  . Fibroid     LONG TERM DX  . Blood dyscrasia     SICKLE CELL TRAIT  . Heart murmur 2007  . Arthritis   . Sacroiliitis    Past Surgical History  Procedure Laterality Date  . Cystectomy      obtain date from  patient, was not on history form dated 06/16/10  . Laparoscopic gastric banding  05/20/2007  . Cervical cone biopsy  1997  . Wisdom tooth extraction    . Dilation and curettage of uterus     Family History  Problem Relation Age of Onset  . Diabetes Mother   . Arthritis Mother   . Hyperlipidemia Mother   . Other Mother     VARICOSE VEINS; DECREASED HEART RATE  . Fibromyalgia Mother   . Heart disease Father   . Hypertension Father   . Alcohol abuse Father   . Drug abuse Father   . Stroke Father   . HIV Sister   . Multiple sclerosis Brother   . Muscular dystrophy Brother   . Cancer Maternal Grandmother     Lung  . Depression Maternal Grandmother   . Miscarriages / Stillbirths Maternal Grandmother   . Cancer Paternal Grandmother     Lung  . Birth defects Maternal Aunt     EXTRA DIGITS; MUSCULAR DISEASE  . Early death Maternal Aunt 29  . Mental illness Maternal Uncle   . COPD Maternal Grandfather   . Stroke Maternal Grandfather    History  Substance Use Topics  . Smoking status: Never Smoker   . Smokeless tobacco:  Never Used  . Alcohol Use: 0.6 oz/week    1 Glasses of wine per week     Comment: a glass of wine occasionally during pregnancy   OB History    Gravida Para Term Preterm AB TAB SAB Ectopic Multiple Living   12 6 6  6  6   6      Review of Systems  Constitutional: Negative for fever and chills.  Respiratory: Negative for shortness of breath.   Cardiovascular: Negative for chest pain.  Gastrointestinal: Negative for nausea, vomiting, diarrhea and constipation.  Genitourinary: Negative for dysuria.  Musculoskeletal: Positive for myalgias and arthralgias.  All other systems reviewed and are negative.     Allergies  Flexeril; Latex; Dust mite extract; and Pollen extract  Home Medications   Prior to Admission medications   Medication Sig Start Date End Date Taking? Authorizing Provider  ALPRAZolam (XANAX XR) 1 MG 24 hr tablet Take 1 mg by mouth daily as  needed for anxiety.    Historical Provider, MD  ALPRAZolam Duanne Moron) 1 MG tablet Take 1 mg by mouth 3 (three) times daily.     Historical Provider, MD  amphetamine-dextroamphetamine (ADDERALL XR) 30 MG 24 hr capsule Take 30 mg by mouth daily.    Historical Provider, MD  amphetamine-dextroamphetamine (ADDERALL) 20 MG tablet Take 20 mg by mouth daily.    Historical Provider, MD  ARIPiprazole (ABILIFY) 10 MG tablet Take 10 mg by mouth daily.    Historical Provider, MD  gabapentin (NEURONTIN) 300 MG capsule Take 1 capsule (300 mg total) by mouth at bedtime. 11/09/14   Boykin Nearing, MD  lamoTRIgine (LAMICTAL) 200 MG tablet Take 200 mg by mouth daily.    Historical Provider, MD  Vortioxetine HBr (BRINTELLIX) 20 MG TABS Take 20 mg by mouth daily.    Historical Provider, MD  zolpidem (AMBIEN) 10 MG tablet Take 10 mg by mouth at bedtime.    Historical Provider, MD   BP 115/83 mmHg  Pulse 91  Temp(Src) 97.7 F (36.5 C) (Oral)  Resp 16  Ht 5\' 5"  (1.651 m)  Wt 172 lb (78.019 kg)  BMI 28.62 kg/m2  SpO2 99%  LMP 11/11/2014 Physical Exam  Constitutional: She is oriented to person, place, and time. She appears well-developed and well-nourished.  HENT:  Head: Normocephalic and atraumatic.  Eyes: Conjunctivae and EOM are normal.  Neck: Normal range of motion.  Cardiovascular: Normal rate and intact distal pulses.   Intact distal pulses with brisk capillary refill  Pulmonary/Chest: Effort normal.  Abdominal: She exhibits no distension.  Musculoskeletal: Normal range of motion.  Left shoulder and arm nontender to palpation, no bony abnormality or deformity, range of motion and strength limited secondary to pain, no swelling, no sign of septic joint  Neurological: She is alert and oriented to person, place, and time.  Skin: Skin is dry.  Skin is intact, there is no erythema, no evidence of infection, no cellulitis  Psychiatric: She has a normal mood and affect. Her behavior is normal. Judgment and  thought content normal.  Nursing note and vitals reviewed.   ED Course  Procedures (including critical care time) Labs Review Labs Reviewed - No data to display  Imaging Review No results found.   EKG Interpretation None      MDM   Final diagnoses:  Shoulder impingement, left    Patient with left shoulder pain. Suspect shoulder impingement syndrome. Pain is reproduced with Hawkins-Kennedy test. There is no evidence of infection. The patient does have some  neuralgia following the ulnar nerve distribution. Patient is taking gabapentin and ibuprofen. I advised patient that she may need physical therapy as well, and will refer her to an orthopedic doctor. No medication changes this time. Patient is stable and ready for discharge.    Montine Circle, PA-C 11/13/14 0626  Jola Schmidt, MD 11/13/14 [phone]

## 2014-11-13 NOTE — ED Notes (Signed)
Declined W/C at D/C and was escorted to lobby by RN. 

## 2014-11-13 NOTE — ED Notes (Signed)
Pt reports Pain startes in LT shoulder into upper arm.

## 2014-11-13 NOTE — ED Notes (Signed)
Pt reports Arm pain started  On Thursday . Pt reports the arm pain started as the Lt leg pain improved. Pt reports she now takes gabapenntin . Pt reports gabapentin did now help LT leg pain .

## 2014-11-13 NOTE — Discharge Instructions (Signed)

## 2014-11-17 ENCOUNTER — Telehealth: Payer: Self-pay | Admitting: *Deleted

## 2014-11-17 NOTE — Telephone Encounter (Signed)
Pt aware of results and appointment  Appointment DVT on Friday at 9:00 arriving 15 min early

## 2014-11-17 NOTE — Telephone Encounter (Signed)
-----   Message from Boykin Nearing, MD sent at 11/10/2014  9:10 AM EDT ----- D dimer elevated follow up lower extremities venous doppler ordered to evaluate for DVT Normal vit D and B12 levels

## 2014-11-18 ENCOUNTER — Ambulatory Visit: Payer: Medicaid Other | Attending: Family Medicine

## 2014-11-18 ENCOUNTER — Telehealth: Payer: Self-pay | Admitting: Family Medicine

## 2014-11-18 ENCOUNTER — Ambulatory Visit (HOSPITAL_COMMUNITY)
Admission: RE | Admit: 2014-11-18 | Discharge: 2014-11-18 | Disposition: A | Discharge status: Home / Self Care | Payer: Medicaid Other | Source: Ambulatory Visit | Attending: Family Medicine | Admitting: Family Medicine

## 2014-11-18 DIAGNOSIS — I82402 Acute embolism and thrombosis of unspecified deep veins of left lower extremity: Secondary | ICD-10-CM

## 2014-11-18 DIAGNOSIS — Z86718 Personal history of other venous thrombosis and embolism: Secondary | ICD-10-CM | POA: Insufficient documentation

## 2014-11-18 DIAGNOSIS — M79605 Pain in left leg: Secondary | ICD-10-CM

## 2014-11-18 MED ORDER — RIVAROXABAN 20 MG PO TABS: 20.0000 mg | ORAL_TABLET | Freq: Every day | ORAL | Status: DC

## 2014-11-18 MED ORDER — RIVAROXABAN (XARELTO) VTE STARTER PACK (15 & 20 MG): ORAL_TABLET | ORAL | Status: DC

## 2014-11-18 NOTE — Progress Notes (Signed)
VASCULAR LAB PRELIMINARY  PRELIMINARY  PRELIMINARY  PRELIMINARY  Bilateral lower extremity venous duplex completed.    Preliminary report:  Right:  No evidence of DVT, superficial thrombosis, or Baker's cyst. Left: DVT noted in a small segment of the most proximal popliteal vein.  No evidence of superficial thrombosis.  No Baker's cyst.  Tiffany [name], RVS 11/18/2014, 4:33 PM

## 2014-11-18 NOTE — Telephone Encounter (Signed)
Pt here at our clinic  Notified small DVT Plan to start Xarelto for 3-6 month Lab drown today -[facility] CMP Above information was given to pt  Labs done today

## 2014-11-18 NOTE — Assessment & Plan Note (Signed)
A: received report that patient small DVT in L leg in setting of recent prolonged flight  P: Start xarelto to anticoagulate

## 2014-11-18 NOTE — Telephone Encounter (Signed)
Advised patient come to clinic to start xarelto pick up rx  Will need CBC and CMP done before stating xarelto Small DVT provoked ([place] plan for 3-6 months of anticoagulation.   Make f/u appt with me

## 2014-11-19 ENCOUNTER — Encounter (HOSPITAL_COMMUNITY): Payer: Medicaid Other

## 2014-11-19 LAB — CBC
HEMATOCRIT: 36.2 % (ref 36.0–46.0)
Hemoglobin: 11.9 g/dL — ABNORMAL LOW (ref 12.0–15.0)
MCH: 27.2 pg (ref 26.0–34.0)
MCHC: 32.9 g/dL (ref 30.0–36.0)
MCV: 82.8 fL (ref 78.0–100.0)
MPV: 9.6 fL (ref 8.6–12.4)
Platelets: 362 10*3/uL (ref 150–400)
RBC: 4.37 MIL/uL (ref 3.87–5.11)
RDW: 16 % — AB (ref 11.5–15.5)
WBC: 4 10*3/uL (ref 4.0–10.5)

## 2014-11-19 LAB — COMPLETE METABOLIC PANEL WITH GFR
ALK PHOS: 49 U/L (ref 33–115)
ALT: 7 U/L (ref 6–29)
AST: 12 U/L (ref 10–30)
Albumin: 3.8 g/dL (ref 3.6–5.1)
BILIRUBIN TOTAL: 0.5 mg/dL (ref 0.2–1.2)
BUN: 6 mg/dL — ABNORMAL LOW (ref 7–25)
CALCIUM: 8.8 mg/dL (ref 8.6–10.2)
CHLORIDE: 102 mmol/L (ref 98–110)
CO2: 27 mmol/L (ref 20–31)
CREATININE: 0.88 mg/dL (ref 0.50–1.10)
GFR, Est African American: >89 mL/min (ref >=60)
GFR, Est Non African American: 82 mL/min (ref >=60)
Glucose, Bld: 79 mg/dL (ref 65–99)
Potassium: 4.1 mmol/L (ref 3.5–5.3)
Sodium: 140 mmol/L (ref 135–146)
Total Protein: 6.7 g/dL (ref 6.1–8.1)

## 2014-11-21 ENCOUNTER — Emergency Department (HOSPITAL_COMMUNITY): Payer: No Typology Code available for payment source

## 2014-11-21 ENCOUNTER — Encounter (HOSPITAL_COMMUNITY): Payer: Self-pay | Admitting: *Deleted

## 2014-11-21 ENCOUNTER — Emergency Department (HOSPITAL_COMMUNITY)
Admission: EM | Admit: 2014-11-21 | Discharge: 2014-11-22 | Disposition: A | Discharge status: Home / Self Care | Payer: No Typology Code available for payment source | Attending: Emergency Medicine | Admitting: Emergency Medicine

## 2014-11-21 DIAGNOSIS — Z91040 Latex allergy status: Secondary | ICD-10-CM | POA: Diagnosis not present

## 2014-11-21 DIAGNOSIS — S161XXA Strain of muscle, fascia and tendon at neck level, initial encounter: Secondary | ICD-10-CM | POA: Diagnosis not present

## 2014-11-21 DIAGNOSIS — Y9389 Activity, other specified: Secondary | ICD-10-CM | POA: Diagnosis not present

## 2014-11-21 DIAGNOSIS — Y998 Other external cause status: Secondary | ICD-10-CM | POA: Insufficient documentation

## 2014-11-21 DIAGNOSIS — S3992XA Unspecified injury of lower back, initial encounter: Secondary | ICD-10-CM | POA: Insufficient documentation

## 2014-11-21 DIAGNOSIS — Y92410 Unspecified street and highway as the place of occurrence of the external cause: Secondary | ICD-10-CM | POA: Diagnosis not present

## 2014-11-21 DIAGNOSIS — R011 Cardiac murmur, unspecified: Secondary | ICD-10-CM | POA: Diagnosis not present

## 2014-11-21 DIAGNOSIS — I10 Essential (primary) hypertension: Secondary | ICD-10-CM | POA: Insufficient documentation

## 2014-11-21 DIAGNOSIS — G43909 Migraine, unspecified, not intractable, without status migrainosus: Secondary | ICD-10-CM | POA: Diagnosis not present

## 2014-11-21 DIAGNOSIS — Z79899 Other long term (current) drug therapy: Secondary | ICD-10-CM | POA: Diagnosis not present

## 2014-11-21 DIAGNOSIS — S29001A Unspecified injury of muscle and tendon of front wall of thorax, initial encounter: Secondary | ICD-10-CM | POA: Diagnosis not present

## 2014-11-21 DIAGNOSIS — Z86711 Personal history of pulmonary embolism: Secondary | ICD-10-CM | POA: Diagnosis not present

## 2014-11-21 DIAGNOSIS — F329 Major depressive disorder, single episode, unspecified: Secondary | ICD-10-CM | POA: Insufficient documentation

## 2014-11-21 DIAGNOSIS — Z86018 Personal history of other benign neoplasm: Secondary | ICD-10-CM | POA: Diagnosis not present

## 2014-11-21 DIAGNOSIS — R0781 Pleurodynia: Secondary | ICD-10-CM

## 2014-11-21 DIAGNOSIS — E669 Obesity, unspecified: Secondary | ICD-10-CM | POA: Insufficient documentation

## 2014-11-21 DIAGNOSIS — Z862 Personal history of diseases of the blood and blood-forming organs and certain disorders involving the immune mechanism: Secondary | ICD-10-CM | POA: Diagnosis not present

## 2014-11-21 DIAGNOSIS — S060X0A Concussion without loss of consciousness, initial encounter: Secondary | ICD-10-CM | POA: Insufficient documentation

## 2014-11-21 DIAGNOSIS — S298XXA Other specified injuries of thorax, initial encounter: Secondary | ICD-10-CM

## 2014-11-21 DIAGNOSIS — S0990XA Unspecified injury of head, initial encounter: Secondary | ICD-10-CM | POA: Diagnosis present

## 2014-11-21 DIAGNOSIS — Z8619 Personal history of other infectious and parasitic diseases: Secondary | ICD-10-CM | POA: Insufficient documentation

## 2014-11-21 DIAGNOSIS — Z8742 Personal history of other diseases of the female genital tract: Secondary | ICD-10-CM | POA: Diagnosis not present

## 2014-11-21 HISTORY — DX: Other pulmonary embolism without acute cor pulmonale: I26.99

## 2014-11-21 MED ORDER — RIVAROXABAN 15 MG PO TABS
15.0000 mg | ORAL_TABLET | Freq: Once | ORAL | Status: AC
  Administered 2014-11-21: 15 mg via ORAL
  Filled 2014-11-21: qty 1

## 2014-11-21 MED ORDER — OXYCODONE-ACETAMINOPHEN 5-325 MG PO TABS
1.0000 | ORAL_TABLET | Freq: Once | ORAL | Status: AC
  Administered 2014-11-21: 1 via ORAL
  Filled 2014-11-21: qty 1

## 2014-11-21 NOTE — ED Notes (Signed)
Spoke with pharmacist who confirmed does with patient.
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
## 2014-11-21 NOTE — ED Notes (Signed)
Patient states takes Xeralto 0900 and 2100. Did not take 2100 dose yet. Taking medication for blood clot in left arm, left leg, and left lung per patient.

## 2014-11-22 MED ORDER — ACETAMINOPHEN 325 MG PO TABS: 650.0000 mg | ORAL_TABLET | Freq: Four times a day (QID) | ORAL | Status: DC | PRN

## 2014-11-30 ENCOUNTER — Encounter: Payer: Self-pay | Admitting: Family Medicine

## 2014-11-30 ENCOUNTER — Ambulatory Visit: Payer: Self-pay | Attending: Family Medicine | Admitting: Family Medicine

## 2014-11-30 VITALS — BP 114/78 | HR 90 | Temp 97.6°F | Resp 16 | Ht 65.0 in | Wt 175.0 lb

## 2014-11-30 DIAGNOSIS — Z7901 Long term (current) use of anticoagulants: Secondary | ICD-10-CM | POA: Insufficient documentation

## 2014-11-30 DIAGNOSIS — I82402 Acute embolism and thrombosis of unspecified deep veins of left lower extremity: Secondary | ICD-10-CM | POA: Insufficient documentation

## 2014-11-30 DIAGNOSIS — M791 Myalgia, unspecified site: Secondary | ICD-10-CM

## 2014-11-30 DIAGNOSIS — R51 Headache: Secondary | ICD-10-CM | POA: Insufficient documentation

## 2014-11-30 DIAGNOSIS — M797 Fibromyalgia: Secondary | ICD-10-CM | POA: Insufficient documentation

## 2014-11-30 DIAGNOSIS — F329 Major depressive disorder, single episode, unspecified: Secondary | ICD-10-CM | POA: Insufficient documentation

## 2014-11-30 LAB — BASIC METABOLIC PANEL
BUN: 7 mg/dL (ref 7–25)
CALCIUM: 9.1 mg/dL (ref 8.6–10.2)
CHLORIDE: 106 mmol/L (ref 98–110)
CO2: 27 mmol/L (ref 20–31)
Creat: 0.79 mg/dL (ref 0.50–1.10)
Glucose, Bld: 68 mg/dL (ref 65–99)
POTASSIUM: 3.9 mmol/L (ref 3.5–5.3)
SODIUM: 141 mmol/L (ref 135–146)

## 2014-11-30 LAB — CBC
HCT: 35.7 % — ABNORMAL LOW (ref 36.0–46.0)
Hemoglobin: 11.9 g/dL — ABNORMAL LOW (ref 12.0–15.0)
MCH: 27.7 pg (ref 26.0–34.0)
MCHC: 33.3 g/dL (ref 30.0–36.0)
MCV: 83.2 fL (ref 78.0–100.0)
MPV: 9.3 fL (ref 8.6–12.4)
PLATELETS: 297 10*3/uL (ref 150–400)
RBC: 4.29 MIL/uL (ref 3.87–5.11)
RDW: 16.7 % — ABNORMAL HIGH (ref 11.5–15.5)
WBC: 3.9 10*3/uL — AB (ref 4.0–10.5)

## 2014-11-30 MED ORDER — METHOCARBAMOL 500 MG PO TABS: 500.0000 mg | ORAL_TABLET | Freq: Four times a day (QID) | ORAL | Status: DC
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
## 2014-11-30 NOTE — Assessment & Plan Note (Signed)
A; anticoagulated on xarelto for provoked DVT P: continue xarelto for 3-6 months
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
## 2014-12-01 ENCOUNTER — Telehealth: Payer: Self-pay | Admitting: *Deleted

## 2014-12-01 NOTE — Telephone Encounter (Signed)
-----   Message from Boykin Nearing, MD sent at 11/19/2014  9:15 AM EDT ----- Normal Cr Hgb slight low but acceptable at 11.9 proceed with xarelto for DVT treatment

## 2014-12-01 NOTE — Telephone Encounter (Signed)
-----   Message from Boykin Nearing, MD sent at 12/01/2014  8:33 AM EDT ----- Normal BMP and CBC

## 2014-12-07 ENCOUNTER — Ambulatory Visit: Payer: Medicaid Other | Admitting: Family Medicine

## 2014-12-08 NOTE — Telephone Encounter (Signed)
-----   Message from Josalyn [name], MD sent at 11/19/2014  9:15 AM EDT ----- Normal Cr Hgb slight low but acceptable at 11.9 proceed with xarelto for DVT treatment 

## 2014-12-08 NOTE — Telephone Encounter (Signed)
Date of birth verified by Pt Lab results given   Pt stated has bruises on leg, no injury   Is possible medication is causing this? Requesting Refills Rx Amitiza

## 2014-12-08 NOTE — Telephone Encounter (Signed)
-----   Message from Josalyn [name], MD sent at 12/01/2014  8:33 AM EDT ----- Normal BMP and CBC 

## 2014-12-09 MED ORDER — LUBIPROSTONE 24 MCG PO CAPS: 24.0000 ug | ORAL_CAPSULE | Freq: Every day | ORAL | Status: DC

## 2014-12-09 NOTE — Telephone Encounter (Signed)
Yes, xarelto could cause bruising.  As long as she is not bleeding, will monitor.  Baker Pierini is not on patient's med list, what does has she been on, how long, for what reason?

## 2014-12-09 NOTE — Telephone Encounter (Signed)
Date of birth Verified by Pt Information given Xarelto could cause bruising   Pt stated taking Amitiza 57mcg daily for IBS for 1 1/2 year  Dr Carolynn Comment at [facility] prescribed it in the past, not longer her PCP Rx refill send to [place]  Pt aware

## 2015-01-31 ENCOUNTER — Ambulatory Visit: Payer: Medicaid Other | Admitting: Family Medicine

## 2015-02-07 ENCOUNTER — Telehealth: Payer: Self-pay | Admitting: Family Medicine

## 2015-02-07 DIAGNOSIS — I82402 Acute embolism and thrombosis of unspecified deep veins of left lower extremity: Secondary | ICD-10-CM

## 2015-02-07 NOTE — Telephone Encounter (Signed)
Pt. Called requesting a med refill on rivaroxaban (XARELTO) 20 MG TABS tablet. Pt. Stated she is going to take her last pill tomorrow. Please f/u with pt.

## 2015-02-08 MED ORDER — RIVAROXABAN (XARELTO) VTE STARTER PACK (15 & 20 MG): ORAL_TABLET | ORAL | Status: DC

## 2015-02-08 NOTE — Telephone Encounter (Signed)
Rx refill send to [place]  Pt notified

## 2015-02-11 ENCOUNTER — Other Ambulatory Visit: Payer: Self-pay | Admitting: *Deleted

## 2015-02-11 DIAGNOSIS — I82412 Acute embolism and thrombosis of left femoral vein: Secondary | ICD-10-CM

## 2015-02-11 MED ORDER — RIVAROXABAN 20 MG PO TABS: 20.0000 mg | ORAL_TABLET | Freq: Every day | ORAL | Status: DC

## 2015-02-18 ENCOUNTER — Ambulatory Visit: Payer: Self-pay | Attending: Family Medicine | Admitting: Family Medicine

## 2015-02-18 ENCOUNTER — Encounter: Payer: Self-pay | Admitting: Family Medicine

## 2015-02-18 VITALS — BP 117/77 | HR 106 | Temp 98.3°F | Resp 16 | Ht 65.0 in | Wt 181.0 lb

## 2015-02-18 DIAGNOSIS — N760 Acute vaginitis: Secondary | ICD-10-CM | POA: Insufficient documentation

## 2015-02-18 DIAGNOSIS — K649 Unspecified hemorrhoids: Secondary | ICD-10-CM | POA: Insufficient documentation

## 2015-02-18 DIAGNOSIS — A499 Bacterial infection, unspecified: Secondary | ICD-10-CM | POA: Insufficient documentation

## 2015-02-18 DIAGNOSIS — N898 Other specified noninflammatory disorders of vagina: Secondary | ICD-10-CM | POA: Insufficient documentation

## 2015-02-18 DIAGNOSIS — Z124 Encounter for screening for malignant neoplasm of cervix: Secondary | ICD-10-CM | POA: Insufficient documentation

## 2015-02-18 DIAGNOSIS — B9689 Other specified bacterial agents as the cause of diseases classified elsewhere: Secondary | ICD-10-CM

## 2015-02-18 MED ORDER — FLUCONAZOLE 150 MG PO TABS: 150.0000 mg | ORAL_TABLET | Freq: Once | ORAL | Status: DC

## 2015-02-18 MED ORDER — TUCKS 50 % EX PADS: 1.0000 | MEDICATED_PAD | CUTANEOUS | Status: DC | PRN

## 2015-02-18 NOTE — Patient Instructions (Addendum)
Tiffany [name] was seen today for dvt, vaginal discharge and hemorrhoids.  Diagnoses and all orders for this visit:  Papanicolaou smear for cervical cancer screening -     Cytology - PAP [place]  Vaginal discharge -     Cytology - PAP [place] -     fluconazole (DIFLUCAN) 150 MG tablet; Take 1 tablet (150 mg total) by mouth once.  Hemorrhoids, unspecified hemorrhoid type -     [place] (TUCKS) 50 % PADS; Apply 1 each topically as needed.    You will be called with results  F/u in 3 months   Dr. Adrian Blackwater

## 2015-02-18 NOTE — Progress Notes (Signed)
Patient ID: Tiffany [name], female   DOB: [birth year], 39 y.o.   MRN: [id number]   Subjective:  Patient ID: Tiffany [name], female    DOB: Apr 23, 1975  Age: 39 y.o. MRN: [id number]  CC: DVT; Vaginal Discharge; and Hemorrhoids   HPI Tiffany [name] presents for    1. DVT: L leg. Dx 11/18/2014. Has family hx of DVT and PE in brother and sister. Taking xarelto. No swelling in legs. No redness. Has intermittent shin pain.   2. Vaginal discharge: x one week. Itching. Took Terazol for presumed yeast. Helped a bit. No lesions. No odor.  3. Hemorrhoids: small with rectal pain. No recent bleeding. Preparation H is not helping. Takes amitiza for IBS with constipation.   Social History  Substance Use Topics  . Smoking status: Never Smoker   . Smokeless tobacco: Never Used  . Alcohol Use: 0.6 oz/week    1 Glasses of wine per week     Comment: a glass of wine occasionally during pregnancy   Outpatient Prescriptions Prior to Visit  Medication Sig Dispense Refill  . acetaminophen (TYLENOL) 325 MG tablet Take 2 tablets (650 mg total) by mouth every 6 (six) hours as needed. 30 tablet 0  . ALPRAZolam (XANAX) 1 MG tablet Take 1 mg by mouth 3 (three) times daily as needed for anxiety.     [name] amphetamine-dextroamphetamine (ADDERALL) 20 MG tablet Take 20 mg by mouth daily.    [name] gabapentin (NEURONTIN) 300 MG capsule Take 1 capsule (300 mg total) by mouth at bedtime. (Patient taking differently: Take 300 mg by mouth at bedtime as needed (for nerve pain). ) 30 capsule 0  . ibuprofen (ADVIL,MOTRIN) 800 MG tablet Take 1 tablet (800 mg total) by mouth 3 (three) times daily. 21 tablet 0  . lamoTRIgine (LAMICTAL) 200 MG tablet Take 200 mg by mouth at bedtime.     [name] lubiprostone (AMITIZA) 24 MCG capsule Take 1 capsule (24 mcg total) by mouth daily with breakfast. 30 capsule 5  . Rivaroxaban (XARELTO STARTER PACK) 15 & 20 MG TBPK Taper per packet insert 51 each 1  . rivaroxaban (XARELTO) 20 MG TABS tablet Take 1 tablet  (20 mg total) by mouth daily with supper. 30 tablet 1  . Vitamin D, Ergocalciferol, (DRISDOL) 50000 UNITS CAPS capsule Take 50,000 Units by mouth 2 (two) times a week. Takes on sun and wed    . Vortioxetine HBr (BRINTELLIX) 20 MG TABS Take 20 mg by mouth every evening.     . zolpidem (AMBIEN) 10 MG tablet Take 10 mg by mouth at bedtime.    [name] amphetamine-dextroamphetamine (ADDERALL XR) 30 MG 24 hr capsule Take 30 mg by mouth daily.    . ARIPiprazole (ABILIFY) 10 MG tablet Take 10 mg by mouth daily.    . methocarbamol (ROBAXIN) 500 MG tablet Take 1 tablet (500 mg total) by mouth 4 (four) times daily. (Patient not taking: Reported on 02/18/2015) 30 tablet 0   No facility-administered medications prior to visit.    ROS Review of Systems  Constitutional: Negative for fever and chills.  Eyes: Negative for visual disturbance.  Respiratory: Negative for shortness of breath.   Cardiovascular: Negative for chest pain.  Gastrointestinal: Positive for constipation and rectal pain. Negative for abdominal pain, diarrhea and blood in stool.  Genitourinary: Positive for vaginal discharge. Negative for vaginal bleeding and vaginal pain.  Musculoskeletal: Negative for back pain and arthralgias.  Skin: Negative for rash.  Allergic/Immunologic: Negative for immunocompromised state.  Hematological:  Negative for adenopathy. Does not bruise/bleed easily.  Psychiatric/Behavioral: Negative for suicidal ideas and dysphoric mood.    Objective:  BP 117/77 mmHg  Pulse 106  Temp(Src) 98.3 F (36.8 C) (Oral)  Resp 16  Ht 5\' 5"  (1.651 m)  Wt 181 lb (82.101 kg)  BMI 30.12 kg/m2  SpO2 100%  LMP 01/23/2015  BP/Weight 02/18/2015 11/30/2014 123XX123  Systolic BP 123XX123 [id number]  Diastolic BP 77 78 74  Wt. (Lbs) 181 175 -  BMI 30.12 29.12 -    Physical Exam  Constitutional: She is oriented to person, place, and time. She appears well-developed and well-nourished. No distress.  HENT:  Head: Normocephalic and  atraumatic.  Cardiovascular: Normal rate, regular rhythm, normal heart sounds and intact distal pulses.   Pulmonary/Chest: Effort normal and breath sounds normal.  Genitourinary: Uterus normal.    Pelvic exam was performed with patient prone. There is no rash, tenderness or lesion on the right labia. There is no rash, tenderness or lesion on the left labia. Cervix exhibits no motion tenderness, no discharge and no friability. Vaginal discharge found.  Musculoskeletal: She exhibits no edema.  Lymphadenopathy:       Right: No inguinal adenopathy present.       Left: No inguinal adenopathy present.  Neurological: She is alert and oriented to person, place, and time.  Skin: Skin is warm and dry. No rash noted.  Psychiatric: She has a normal mood and affect.     Assessment & Plan:   Problem List Items Addressed This Visit    Hemorrhoid   Relevant Medications   Witch Hazel (TUCKS) 50 % PADS   Vaginal discharge   Relevant Medications   fluconazole (DIFLUCAN) 150 MG tablet   Other Relevant Orders   Cytology - PAP [place] [facility]    Other Visit Diagnoses    Papanicolaou smear for cervical cancer screening    -  Primary    Relevant Orders    Cytology - PAP [place]       No orders of the defined types were placed in this encounter.    Follow-up: No Follow-up on file.   Boykin Nearing MD

## 2015-02-18 NOTE — Progress Notes (Signed)
F/U DVT  C/C possible yeast infection, hemorrhoid Vaginal discharge, itching  No tobacco user  No suicidal thought in the past two week

## 2015-02-21 LAB — CERVICOVAGINAL ANCILLARY ONLY
CHLAMYDIA, DNA PROBE: NEGATIVE
NEISSERIA GONORRHEA: NEGATIVE
TRICH (WINDOWPATH): NEGATIVE

## 2015-02-21 LAB — CYTOLOGY - PAP

## 2015-02-22 LAB — CERVICOVAGINAL ANCILLARY ONLY: Wet Prep (BD Affirm): POSITIVE — AB

## 2015-02-22 MED ORDER — FLUCONAZOLE 150 MG PO TABS: 150.0000 mg | ORAL_TABLET | Freq: Once | ORAL | Status: DC

## 2015-02-22 MED ORDER — METRONIDAZOLE 500 MG PO TABS: 500.0000 mg | ORAL_TABLET | Freq: Two times a day (BID) | ORAL | Status: DC

## 2015-02-22 NOTE — Addendum Note (Signed)
Addended by: Boykin Nearing on: 02/22/2015 09:07 AM   Modules accepted: Orders

## 2015-03-17 ENCOUNTER — Telehealth: Payer: Self-pay | Admitting: Family Medicine

## 2015-03-17 NOTE — Telephone Encounter (Signed)
Patient came in requesting a medication refill for xanax Please follow up with patient.

## 2015-03-18 NOTE — Telephone Encounter (Signed)
Patient will have to wait until Dr.[name] returns as it appears this was prescribed by a history provider.

## 2015-03-23 NOTE — Telephone Encounter (Signed)
Pt stated Dr Toy Care was prescribing Xanax in the past  Pt is unable to schedule appointment with mental health provider due to no insurance  Pt has to pay out of packet and unable to affort it

## 2015-03-23 NOTE — Telephone Encounter (Signed)
Please provide mental health resources to patient. Counseling services available at [place], [place] and [place].

## 2015-03-23 NOTE — Telephone Encounter (Signed)
Xanax should be refilled by her mental health provider if they agree to continuing this course of treatment

## 2015-03-24 NOTE — Telephone Encounter (Signed)
Information mail to pt

## 2015-04-18 MED FILL — XARELTO 20 MG TABLET: 20 | 10 days supply | Qty: 10 | Fill #2

## 2015-05-23 ENCOUNTER — Encounter: Payer: Managed Care, Other (non HMO) | Admitting: Obstetrics and Gynecology

## 2015-05-25 ENCOUNTER — Ambulatory Visit (INDEPENDENT_AMBULATORY_CARE_PROVIDER_SITE_OTHER): Payer: Managed Care, Other (non HMO) | Admitting: Obstetrics and Gynecology

## 2015-05-25 ENCOUNTER — Encounter: Payer: Self-pay | Admitting: Obstetrics and Gynecology

## 2015-05-25 VITALS — BP 114/70 | HR 76 | Resp 16 | Ht 65.5 in | Wt 182.4 lb

## 2015-05-25 DIAGNOSIS — Z8041 Family history of malignant neoplasm of ovary: Secondary | ICD-10-CM

## 2015-05-25 DIAGNOSIS — Z86711 Personal history of pulmonary embolism: Secondary | ICD-10-CM | POA: Diagnosis not present

## 2015-05-25 DIAGNOSIS — Z86718 Personal history of other venous thrombosis and embolism: Secondary | ICD-10-CM | POA: Diagnosis not present

## 2015-05-25 DIAGNOSIS — N926 Irregular menstruation, unspecified: Secondary | ICD-10-CM | POA: Diagnosis not present

## 2015-05-25 DIAGNOSIS — Z803 Family history of malignant neoplasm of breast: Secondary | ICD-10-CM

## 2015-05-25 DIAGNOSIS — M25559 Pain in unspecified hip: Secondary | ICD-10-CM | POA: Diagnosis not present

## 2015-05-25 LAB — CBC WITH DIFFERENTIAL/PLATELET
BASOS ABS: 0.1 10*3/uL (ref 0.0–0.1)
Basophils Relative: 1 % (ref 0–1)
Eosinophils Absolute: 0.3 10*3/uL (ref 0.0–0.7)
Eosinophils Relative: 6 % — ABNORMAL HIGH (ref 0–5)
HCT: 34.7 % — ABNORMAL LOW (ref 36.0–46.0)
HEMOGLOBIN: 11.4 g/dL — AB (ref 12.0–15.0)
LYMPHS ABS: 3 10*3/uL (ref 0.7–4.0)
LYMPHS PCT: 54 % — AB (ref 12–46)
MCH: 25.6 pg — ABNORMAL LOW (ref 26.0–34.0)
MCHC: 32.9 g/dL (ref 30.0–36.0)
MCV: 77.8 fL — AB (ref 78.0–100.0)
MONOS PCT: 8 % (ref 3–12)
MPV: 9.7 fL (ref 8.6–12.4)
Monocytes Absolute: 0.4 10*3/uL (ref 0.1–1.0)
NEUTROS ABS: 1.7 10*3/uL (ref 1.7–7.7)
NEUTROS PCT: 31 % — AB (ref 43–77)
PLATELETS: 415 10*3/uL — AB (ref 150–400)
RBC: 4.46 MIL/uL (ref 3.87–5.11)
RDW: 17.1 % — AB (ref 11.5–15.5)
WBC: 5.6 10*3/uL (ref 4.0–10.5)

## 2015-05-25 NOTE — Progress Notes (Signed)
Patient ID: Tiffany [name], female   DOB: 02-Jan-1976, 40 y.o.   MRN: [id number] GYNECOLOGY  VISIT   HPI: 40 y.o.   Single  African American  female   [phone] with Patient's last menstrual period was 05/11/2015 (exact date).   here for left lower quadrant pain for 3 weeks.    Has bilateral lower abdominal pain.  Left more tender than right side.  Sudden onset of the pain which comes and goes.  Pain can become a 9 or a 10.  Heating pad helps.  Motrin and Tylenol not helpful.  Having some nausea and vomiting, but not much.  No fevers.  No diarrhea.  Notes some pain externally with voiding.  Feels like she has scratches on the outside area.   On her current menses for 2 weeks now.  This is rare for this to happen.  Menses usually every 21 days.  Has low back pain with cycles and mood swings.  Had an ovarian cyst in 2002 which was removed during pregnancy at 4 months.   History of uterine fibroids.  No tx.   Hx of BV and yeast.   Hx of left lower extremity DVT/PE in July 2016.  She was not on oral contraceptives.  Stopped Xarelto on her own in early January 2017.  Sister and brother also have hx of DVT.  Patient lost 150 pound from lap band surgery.   Sister died from ovarian cancer at age 12.  She also had HIV.  Single mother.  Brother has MS and lives with her.  Has 8 children.  GYNECOLOGIC HISTORY: Patient's last menstrual period was 05/11/2015 (exact date). Contraception:Abstinence.  Last time was 18 months ago.  Menopausal hormone therapy: n/a Last mammogram: 2014 normal per patient Last pap smear: 02-18-15 Neg:Neg HR HPV--Hx colpo/cryotherapy to cervix 2003;paps normal since        OB History    Gravida Para Term Preterm AB TAB SAB Ectopic Multiple Living   12 6 6  6  6   6          Patient Active Problem List   Diagnosis Date Noted  . BV (bacterial vaginosis) 02/18/2015  . Hemorrhoid 02/18/2015  . Muscle pain 11/30/2014  . Left leg DVT ([place]) 11/18/2014  .  Hx of laparoscopic gastric banding 01/29/2012  . Depression 12/19/2011  . Fibromyalgia 12/19/2011  . Arthritis 12/19/2011  . IBS (irritable bowel syndrome) 12/19/2011  . GERD (gastroesophageal reflux disease) 12/19/2011  . Fibroids 12/19/2011    Past Medical History  Diagnosis Date  . Bruises easily   . IBD (inflammatory bowel disease)   . Migraine   . Cyst of ovary 2003  . H/O sickle cell trait   . Trichomonas   . Breast mass in female 2003  . Depression 2003  . DUB (dysfunctional uterine bleeding) 2003  . Menses, irregular 2003  . BV (bacterial vaginosis)   . Hx: UTI (urinary tract infection) 2003  . Low back pain 2003  . Obese 2003  . Vaginitis and vulvovaginitis 2004  . H/O amenorrhea 2006  . GERD (gastroesophageal reflux disease) 2007  . H/O menorrhagia 2011  . Yeast vaginitis 04/24/2010  . Anxiety     TAKING MEDS; DR Toy Care  . Hypertension 2005    RESOLVED WITH WT LOSS  . Abnormal Pap smear 1997    LAST PAP 10/2010  . Infection     OCC YEAST  . Anemia     CHRONIC  . Fibromyalgia 2004  .  Fibroid     LONG TERM DX  . Blood dyscrasia     SICKLE CELL TRAIT  . Heart murmur 2007  . Arthritis   . Sacroiliitis ([place])   . Pulmonary embolism ([place]) 10/2014  . DVT, lower extremity ([place]) 10/2014    left leg  . Blood transfusion without reported diagnosis 2005    after vaginal delivery  . History of abnormal cervical Pap smear 2003    had colposcopy and cryo.  Follow up paps are normal.    Past Surgical History  Procedure Laterality Date  . Cystectomy Right     obtain date from patient, was not on history form dated 06/16/10--benign cyst removed Rt.ovary  . Laparoscopic gastric banding  05/20/2007  . Cervical cone biopsy  1997  . Wisdom tooth extraction    . Dilation and curettage of uterus    . Augmentation mammaplasty  [id number]    Silicone implants  . Cosmetic surgery  2012    thigh lift    Current Outpatient Prescriptions  Medication Sig Dispense Refill  .  acetaminophen (TYLENOL) 325 MG tablet Take 2 tablets (650 mg total) by mouth every 6 (six) hours as needed. 30 tablet 0  . ALPRAZolam (XANAX XR) 1 MG 24 hr tablet Take 1 mg by mouth as needed for anxiety.    . ALPRAZolam (XANAX) 1 MG tablet Take 1 mg by mouth 3 (three) times daily as needed for anxiety.     . ARIPiprazole (ABILIFY) 10 MG tablet Take 10 mg by mouth daily.    [name] dexmethylphenidate (FOCALIN) 10 MG tablet Take 1 tablet by mouth daily.  0  . Dexmethylphenidate HCl (FOCALIN XR) 40 MG CP24 Take 1 capsule by mouth daily.    [name] FLUTICASONE PROPIONATE, NASAL, NA Place 1 spray into the nose as needed.    [name] ibuprofen (ADVIL,MOTRIN) 800 MG tablet Take 1 tablet (800 mg total) by mouth 3 (three) times daily. (Patient taking differently: Take 800 mg by mouth every 8 (eight) hours as needed. ) 21 tablet 0  . lubiprostone (AMITIZA) 24 MCG capsule Take 1 capsule (24 mcg total) by mouth daily with breakfast. 30 capsule 5  . Vitamin D, Ergocalciferol, (DRISDOL) 50000 UNITS CAPS capsule Take 50,000 Units by mouth 2 (two) times a week. Takes on sun and wed    . Vortioxetine HBr (BRINTELLIX) 20 MG TABS Take 20 mg by mouth every evening.     . zolpidem (AMBIEN) 10 MG tablet Take 10 mg by mouth at bedtime.    . rivaroxaban (XARELTO) 20 MG TABS tablet Take 1 tablet (20 mg total) by mouth daily with supper. (Patient not taking: Reported on 05/25/2015) 30 tablet 1   No current facility-administered medications for this visit.     ALLERGIES: Flexeril and Latex  Family History  Problem Relation Age of Onset  . Diabetes Mother   . Arthritis Mother   . Hyperlipidemia Mother   . Other Mother     VARICOSE VEINS; DECREASED HEART RATE  . Fibromyalgia Mother   . Heart disease Father   . Hypertension Father   . Alcohol abuse Father   . Drug abuse Father   . Stroke Father   . HIV Sister   . Multiple sclerosis Brother   . Muscular dystrophy Brother   . Thyroid disease Brother   . Cancer Maternal Grandmother      Lung  . Depression Maternal Grandmother   . Miscarriages / Stillbirths Maternal Grandmother   . Cancer Paternal  Grandmother     Lung  . Birth defects Maternal Aunt     EXTRA DIGITS; MUSCULAR DISEASE  . Early death Maternal Aunt 78  . Mental illness Maternal Uncle   . COPD Maternal Grandfather   . Stroke Maternal Grandfather   . Ovarian cancer Maternal Aunt 6    Dec.age 27 Ovarian CA  . Breast cancer Maternal Aunt 45    Dec age 37 Breast CA    Social History   Social History  . Marital Status: Single    Spouse Name: N/A  . Number of Children: 5  . Years of Education: 16   Occupational History  . CLINICAL RESEARCH    Social History Main Topics  . Smoking status: Never Smoker   . Smokeless tobacco: Never Used  . Alcohol Use: 1.2 oz/week    2 Glasses of wine per week  . Drug Use: No  . Sexual Activity:    Partners: Male    Birth Control/ Protection: Abstinence, None   Other Topics Concern  . Not on file   Social History Narrative    ROS:  Pertinent items are noted in HPI.  PHYSICAL EXAMINATION:    BP 114/70 mmHg  Pulse 76  Resp 16  Ht 5' 5.5" (1.664 m)  Wt 182 lb 6.4 oz (82.736 kg)  BMI 29.88 kg/m2  LMP 05/11/2015 (Exact Date)    General appearance: alert, cooperative and appears stated age Head: Normocephalic, without obvious abnormality, atraumatic Neck: no adenopathy, supple, symmetrical, trachea midline and thyroid normal to inspection and palpation Lungs: clear to auscultation bilaterally Heart: regular rate and rhythm Abdomen: lap band bulb palpable in left upper abdomen, soft, non-tender;  no masses,  no organomegaly Extremities: extremities normal, atraumatic, no cyanosis or edema Skin: Skin color, texture, turgor normal. No rashes or lesions Lymph nodes: Cervical, supraclavicular, and axillary nodes normal. No abnormal inguinal nodes palpated Neurologic: Grossly normal  Pelvic: External genitalia:  no lesions              Urethra:  normal  appearing urethra with no masses, tenderness or lesions              Bartholins and Skenes: normal                 Vagina: normal appearing vagina with normal color and discharge, no lesions              Cervix: no lesions and minimal bleeding noted.              Pap taken: No. Bimanual Exam:  Uterus:  normal size, contour, position, consistency, mobility, non-tender              Adnexa: normal adnexa and no mass, fullness, tenderness              Rectovaginal: Yes.  .  Confirms.              Anus:  normal sphincter tone, no lesions  Chaperone was present for exam.  ASSESSMENT  LLQ pain.  Hx DVT, PE.  Off anticoagulants. FH of breast and ovarian cancer.   PLAN  Counseled regarding pelvic pain and abnormal uterine bleeding.  Check CBC with diff and TSH.  Return tomorrow for pelvic ultrasound.  I am referring to medical oncology for work up of DVT and PE.  Explained possible diagnoses and importance of avoiding estrogens.  I have also recommended genetic counseling and testing.   An After Visit Summary was  printed and given to the patient.  __45____ minutes face to face time of which over 50% was spent in counseling.

## 2015-05-25 NOTE — Patient Instructions (Signed)
BRCA-1 and BRCA-2 Testing BRCA-1 and BRCA-2 are genes that make proteins that help repair damaged cells. BRCA-1 and BRCA-2 testing is done to see if there is a mutation in either of these genes. If there is a mutation, the genes may not be able to help repair damaged cells. As a result, the cells may develop defects that can lead to certain types of cancer. You may have this test if you have a family history of certain types of cancer, including cancer of the:  Breast.  Fallopian tubes.  Ovaries.  Peritoneum. The test requires either a sample of blood or a sample of the cells from the inside of your cheek. If a sample of blood is taken, it will be drawn from a vein in your arm using a thin needle. If a sample of cells is taken, you will get instructions on how to use a rinse to collect the sample. RESULTS It is your responsibility to obtain your test results. Ask the lab or the department doing the test when and how you will get the results. Contact your health care provider if you have any questions about your results. The lab test results can show whether:  You do not have a mutation in the BRCA-1 or BRCA-2 gene that increases your risk for certain cancers.  You have a mutation in the BRCA-1 or BRCA-2 gene that increases your risk for certain cancers.  You have a mutation in the BRCA-1 or BRCA-2 gene that has not been found to increase your risk for certain cancers. Meaning of Negative Test Results A negative test result means that you do not have a mutation in the BRCA-1 or BRCA-2 gene that is known to increase your risk for certain cancers. This does not mean you will never get cancer. Talk to your health care provider or genetic counselor about what this result means for you. Meaning of Positive Test Results A positive test result means that you do have a mutation in the BRCA-1 or BRCA-2 gene that increases your risk for certain cancers. Women with a positive test result have an  increased risk for ovarian cancer. Both women and men with a mutation have an increased risk for breast cancer and may be at greater risk for other types of cancer. Getting a positive test result does not mean you will develop cancer.  You may be told you are a carrier. This means you can pass the mutation to your children.  Talk to your health care provider or genetic counselor about what this result means for you. Meaning of Ambiguous Test Results Ambiguous, inconclusive, or uncertain test results mean there is a change in the BRCA-1 or BRCA-2 gene, but this change has not been linked to cancer. Talk to your health care provider or genetic counselor about what this result means for you.   This information is not intended to replace advice given to you by your health care provider. Make sure you discuss any questions you have with your health care provider.   Document Released: 04/19/2004 Document Revised: 04/16/2014 Document Reviewed: 06/25/2013 Elsevier Interactive Patient Education 2016 [employer]   Pulmonary Embolism A pulmonary embolism (PE) is a sudden blockage or decrease of blood flow in one lung or both lungs. Most blockages come from a blood clot that travels from the legs or the pelvis to the lungs. PE is a dangerous and potentially life-threatening condition if it is not treated right away. CAUSES A pulmonary embolism occurs most commonly when  a blood clot travels from one of your veins to your lungs. Rarely, PE is caused by air, fat, amniotic fluid, or part of a tumor traveling through your veins to your lungs. RISK FACTORS A PE is more likely to develop in:  People who smoke.  People who areolder, especially over 18 years of age.  People who are overweight (obese).  People who sit or lie still for a long time, such as during long-distance travel (over 4 hours), bed rest, hospitalization, or during recovery from certain medical conditions like a stroke.  People who do  not engage in much physical activity (sedentary lifestyle).  People who have chronic breathing disorders.  People whohave a personal or family history of blood clots or blood clotting disease.  People whohave peripheral vascular disease (PVD), diabetes, or some types of cancer.  People who haveheart disease, especially if the person had a recent heart attack or has congestive heart failure.  People who have neurological diseases that affect the legs (leg paresis).  People who have had a traumatic injury, such as breaking a hip or leg.  People whohave recently had major or lengthy surgery, especially on the hip, knee, or abdomen.  People who have hada central line placed inside a large vein.  People who takemedicines that contain the hormone estrogen. These include birth control pills and hormone replacement therapy.  Pregnancy or during childbirth or the postpartum period. SIGNS AND SYMPTOMS  The symptoms of a PE usually start suddenly and include:  Shortness of breath while active or at rest.  Coughing or coughing up blood or blood-tinged mucus.  Chest pain that is often worse with deep breaths.  Rapid or irregular heartbeat.  Feeling light-headed or dizzy.  Fainting.  Feelinganxious.  Sweating. There may also be pain and swelling in a leg if that is where the blood clot started. These symptoms may represent a serious problem that is an emergency. Do not wait to see if the symptoms will go away. Get medical help right away. Call your local emergency services (911 in the [place] Do not drive yourself to the hospital. DIAGNOSIS Your health care provider will take a medical history and perform a physical exam. You may also have other tests, including:  Blood tests to assess the clotting properties of your blood, assess oxygen levels in your blood, and find blood clots.  Imaging tests, such as CT, ultrasound, MRI, X-ray, and other tests to see if you have clots  anywhere in your body.  An electrocardiogram (ECG) to look for heart strain from blood clots in the lungs. TREATMENT The main goals of PE treatment are:  To stop a blood clot from growing larger.  To stop new blood clots from forming. The type of treatment that you receive depends on many factors, such as the cause of your PE, your risk for bleeding or developing more clots, and other medical conditions that you have. Sometimes, a combination of treatments is necessary. This condition may be treated with:  Medicines, including newer oral blood thinners (anticoagulants), warfarin, low molecular weight heparins, thrombolytics, or heparins.  Wearing compression stockings or using different types of devices.  Surgery (rare) to remove the blood clot or to place a filter in your abdomen to stop the blood clot from traveling to your lungs. Treatments for a PE are often divided into immediate treatment, long-term treatment (up to 3 months after PE), and extended treatment (more than 3 months after PE). Your treatment may continue for several  months. This is called maintenance therapy, and it is used to prevent the forming of new blood clots. You can work with your health care provider to choose the treatment program that is best for you. What are anticoagulants? Anticoagulants are medicines that treat PEs. They can stop current blood clots from growing and stop new clots from forming. They cannot dissolve existing clots. Your body dissolves clots by itself over time. Anticoagulants are given by mouth, by injection, or through an IV tube. What are thrombolytics? Thrombolytics are clot-dissolving medicines that are used to dissolve a PE. They carry a high risk of bleeding, so they tend to be used only in severe cases or if you have very low blood pressure. HOME CARE INSTRUCTIONS If you are taking a newer oral anticoagulant:  Take the medicine every single day at the same time each  day.  Understand what foods and drugs interact with this medicine.  Understand that there are no regular blood tests required when using this medicine.  Understandthe side effects of this medicine, including excessive bruising or bleeding. Ask your health care provider or pharmacist about other possible side effects. If you are taking warfarin:  Understand how to take warfarin and know which foods can affect how warfarin works in Veterinary surgeon.  Understand that it is dangerous to taketoo much or too little warfarin. Too much warfarin increases the risk of bleeding. Too little warfarin continues to allow the risk for blood clots.  Follow your PT and INR blood testing schedule. The PT and INR results allow your health care provider to adjust your dose of warfarin. It is very important that you have your PT and INR tested as often as told by your health care provider.  Avoid major changes in your diet, or tell your health care provider before you change your diet. Arrange a visit with a registered dietitian to answer your questions. Many foods, especially foods that are high in vitamin K, can interfere with warfarin and affect the PT and INR results. Eat a consistent amount of foods that are high in vitamin K, such as:  Spinach, kale, broccoli, cabbage, collard greens, turnip greens, Brussels sprouts, peas, cauliflower, seaweed, and parsley.  Beef liver and pork liver.  Green tea.  Soybean oil.  Tell your health care provider about any and all medicines, vitamins, and supplements that you take, including aspirin and other over-the-counter anti-inflammatory medicines. Be especially cautious with aspirin and anti-inflammatory medicines. Do not take those before you ask your health care provider if it is safe to do so. This is important because many medicines can interfere with warfarin and affect the PT and INR results.  Do not start or stop taking any over-the-counter or prescription medicine  unless your health care provider or pharmacist tells you to do so. If you take warfarin, you will also need to do these things:  Hold pressure over cuts for longer than usual.  Tell your dentist and other health care providers that you are taking warfarin before you have any procedures in which bleeding may occur.  Avoid alcohol or drink very small amounts. Tell your health care provider if you change your alcohol intake.  Do not use tobacco products, including cigarettes, chewing tobacco, and e-cigarettes. If you need help quitting, ask your health care provider.  Avoid contact sports. General Instructions  Take over-the-counter and prescription medicines only as told by your health care provider. Anticoagulant medicines can have side effects, including easy bruising and difficulty stopping bleeding.  If you are prescribed an anticoagulant, you will also need to do these things:  Hold pressure over cuts for longer than usual.  Tell your dentist and other health care providers that you are taking anticoagulants before you have any procedures in which bleeding may occur.  Avoid contact sports.  Wear a medical alert bracelet or carry a medical alert card that says you have had a PE.  Ask your health care provider how soon you can go back to your normal activities. Stay active to prevent new blood clots from forming.  Make sure to exercise while traveling or when you have been sitting or standing for a long period of time. It is very important to exercise. Exercise your legs by walking or by tightening and relaxing your leg muscles often. Take frequent walks.  Wear compression stockings as told by your health care provider to help prevent more blood clots from forming.  Do not use tobacco products, including cigarettes, chewing tobacco, and e-cigarettes. If you need help quitting, ask your health care provider.  Keep all follow-up appointments with your health care provider. This is  important. PREVENTION Take these actions to decrease your risk of developing another PE:  Exercise regularly. For at least 30 minutes every day, engage in:  Activity that involves moving your arms and legs.  Activity that encourages good blood flow through your body by increasing your heart rate.  Exercise your arms and legs every hour during long-distance travel (over 4 hours). Drink plenty of water and avoid drinking alcohol while traveling.  Avoid sitting or lying in bed for long periods of time without moving your legs.  Maintain a weight that is appropriate for your height. Ask your health care provider what weight is healthy for you.  If you are a woman who is over 62 years of age, avoid unnecessary use of medicines that contain estrogen. These include birth control pills.  Do not smoke, especially if you take estrogen medicines. If you need help quitting, ask your health care provider.  If you are at very high risk for PE, wear compression stockings.  If you recently had a PE, have regularly scheduled ultrasound testing on your legs to check for new blood clots. If you are hospitalized, prevention measures may include:  Early walking after surgery, as soon as your health care provider says that it is safe.  Receiving anticoagulants to prevent blood clots. If you cannot take anticoagulants, other options may be available, such as wearing compression stockings or using different types of devices. SEEK IMMEDIATE MEDICAL CARE IF:  You have new or increased pain, swelling, or redness in an arm or leg.  You have numbness or tingling in an arm or leg.  You have shortness of breath while active or at rest.  You have chest pain.  You have a rapid or irregular heartbeat.  You feel light-headed or dizzy.  You cough up blood.  You notice blood in your vomit, bowel movement, or urine.  You have a fever. These symptoms may represent a serious problem that is an emergency. Do  not wait to see if the symptoms will go away. Get medical help right away. Call your local emergency services (911 in the [place] Do not drive yourself to the hospital.   This information is not intended to replace advice given to you by your health care provider. Make sure you discuss any questions you have with your health care provider.   Document Released: 03/23/2000 Document Revised:  12/15/2014 Document Reviewed: 07/21/2014 Elsevier Interactive Patient Education [employer].

## 2015-05-26 ENCOUNTER — Ambulatory Visit (INDEPENDENT_AMBULATORY_CARE_PROVIDER_SITE_OTHER): Payer: Managed Care, Other (non HMO) | Admitting: Obstetrics and Gynecology

## 2015-05-26 ENCOUNTER — Other Ambulatory Visit: Payer: Self-pay | Admitting: *Deleted

## 2015-05-26 ENCOUNTER — Other Ambulatory Visit: Payer: Self-pay

## 2015-05-26 ENCOUNTER — Encounter: Payer: Self-pay | Admitting: Obstetrics and Gynecology

## 2015-05-26 ENCOUNTER — Ambulatory Visit (INDEPENDENT_AMBULATORY_CARE_PROVIDER_SITE_OTHER): Payer: Managed Care, Other (non HMO)

## 2015-05-26 VITALS — BP 120/72 | HR 70 | Ht 65.5 in | Wt 182.0 lb

## 2015-05-26 DIAGNOSIS — N921 Excessive and frequent menstruation with irregular cycle: Secondary | ICD-10-CM

## 2015-05-26 DIAGNOSIS — D259 Leiomyoma of uterus, unspecified: Secondary | ICD-10-CM

## 2015-05-26 DIAGNOSIS — Z8041 Family history of malignant neoplasm of ovary: Secondary | ICD-10-CM

## 2015-05-26 DIAGNOSIS — M25559 Pain in unspecified hip: Secondary | ICD-10-CM | POA: Diagnosis not present

## 2015-05-26 DIAGNOSIS — Z803 Family history of malignant neoplasm of breast: Secondary | ICD-10-CM | POA: Diagnosis not present

## 2015-05-26 DIAGNOSIS — Z1231 Encounter for screening mammogram for malignant neoplasm of breast: Secondary | ICD-10-CM

## 2015-05-26 LAB — TSH: TSH: 1.4 mIU/L

## 2015-05-26 MED ORDER — MEDROXYPROGESTERONE ACETATE 10 MG PO TABS: 10.0000 mg | ORAL_TABLET | Freq: Every day | ORAL | Status: DC

## 2015-05-26 NOTE — Patient Instructions (Signed)
Norethindrone tablets (contraception) What is this medicine? NORETHINDRONE (nor eth IN drone) is an oral contraceptive. The product contains a female hormone known as a progestin. It is used to prevent pregnancy. This medicine may be used for other purposes; ask your health care provider or pharmacist if you have questions. What should I tell my health care provider before I take this medicine? They need to know if you have any of these conditions: -blood vessel disease or blood clots -breast, cervical, or vaginal cancer -diabetes -heart disease -kidney disease -liver disease -mental depression -migraine -seizures -stroke -vaginal bleeding -an unusual or allergic reaction to norethindrone, other medicines, foods, dyes, or preservatives -pregnant or trying to get pregnant -breast-feeding How should I use this medicine? Take this medicine by mouth with a glass of water. You may take it with or without food. Follow the directions on the prescription label. Take this medicine at the same time each day and in the order directed on the package. Do not take your medicine more often than directed. Contact your pediatrician regarding the use of this medicine in children. Special care may be needed. This medicine has been used in female children who have started having menstrual periods. A patient package insert for the product will be given with each prescription and refill. Read this sheet carefully each time. The sheet may change frequently. Overdosage: If you think you have taken too much of this medicine contact a poison control center or emergency room at once. NOTE: This medicine is only for you. Do not share this medicine with others. What if I miss a dose? Try not to miss a dose. Every time you miss a dose or take a dose late your chance of pregnancy increases. When 1 pill is missed (even if only 3 hours late), take the missed pill as soon as possible and continue taking a pill each day at  the regular time (use a back up method of birth control for the next 48 hours). If more than 1 dose is missed, use an additional birth control method for the rest of your pill pack until menses occurs. Contact your health care professional if more than 1 dose has been missed. What may interact with this medicine? Do not take this medicine with any of the following medications: -amprenavir or fosamprenavir -bosentan This medicine may also interact with the following medications: -antibiotics or medicines for infections, especially rifampin, rifabutin, rifapentine, and griseofulvin, and possibly penicillins or tetracyclines -aprepitant -barbiturate medicines, such as phenobarbital -carbamazepine -felbamate -modafinil -oxcarbazepine -phenytoin -ritonavir or other medicines for HIV infection or AIDS -St. John's wort -topiramate This list may not describe all possible interactions. Give your health care provider a list of all the medicines, herbs, non-prescription drugs, or dietary supplements you use. Also tell them if you smoke, drink alcohol, or use illegal drugs. Some items may interact with your medicine. What should I watch for while using this medicine? Visit your doctor or health care professional for regular checks on your progress. You will need a regular breast and pelvic exam and Pap smear while on this medicine. Use an additional method of birth control during the first cycle that you take these tablets. If you have any reason to think you are pregnant, stop taking this medicine right away and contact your doctor or health care professional. If you are taking this medicine for hormone related problems, it may take several cycles of use to see improvement in your condition. This medicine does not protect you   against HIV infection (AIDS) or any other sexually transmitted diseases. What side effects may I notice from receiving this medicine? Side effects that you should report to your  doctor or health care professional as soon as possible: -breast tenderness or discharge -pain in the abdomen, chest, groin or leg -severe headache -skin rash, itching, or hives -sudden shortness of breath -unusually weak or tired -vision or speech problems -yellowing of skin or eyes Side effects that usually do not require medical attention (report to your doctor or health care professional if they continue or are bothersome): -changes in sexual desire -change in menstrual flow -facial hair growth -fluid retention and swelling -headache -irritability -nausea -weight gain or loss This list may not describe all possible side effects. Call your doctor for medical advice about side effects. You may report side effects to FDA at 1-800-FDA-1088. Where should I keep my medicine? Keep out of the reach of children. Store at room temperature between 15 and 30 degrees C (59 and 86 degrees F). Throw away any unused medicine after the expiration date. NOTE: This sheet is a summary. It may not cover all possible information. If you have questions about this medicine, talk to your doctor, pharmacist, or health care provider.    2016, Elsevier/Gold Standard. (2011-12-14 16:41:35)  Levonorgestrel intrauterine device (IUD) What is this medicine? LEVONORGESTREL IUD (LEE voe nor jes trel) is a contraceptive (birth control) device. The device is placed inside the uterus by a healthcare professional. It is used to prevent pregnancy and can also be used to treat heavy bleeding that occurs during your period. Depending on the device, it can be used for 3 to 5 years. This medicine may be used for other purposes; ask your health care provider or pharmacist if you have questions. What should I tell my health care provider before I take this medicine? They need to know if you have any of these conditions: -abnormal Pap smear -cancer of the breast, uterus, or cervix -diabetes -endometritis -genital or pelvic  infection now or in the past -have more than one sexual partner or your partner has more than one partner -heart disease -history of an ectopic or tubal pregnancy -immune system problems -IUD in place -liver disease or tumor -problems with blood clots or take blood-thinners -use intravenous drugs -uterus of unusual shape -vaginal bleeding that has not been explained -an unusual or allergic reaction to levonorgestrel, other hormones, silicone, or polyethylene, medicines, foods, dyes, or preservatives -pregnant or trying to get pregnant -breast-feeding How should I use this medicine? This device is placed inside the uterus by a health care professional. Talk to your pediatrician regarding the use of this medicine in children. Special care may be needed. Overdosage: If you think you have taken too much of this medicine contact a poison control center or emergency room at once. NOTE: This medicine is only for you. Do not share this medicine with others. What if I miss a dose? This does not apply. What may interact with this medicine? Do not take this medicine with any of the following medications: -amprenavir -bosentan -fosamprenavir This medicine may also interact with the following medications: -aprepitant -barbiturate medicines for inducing sleep or treating seizures -bexarotene -griseofulvin -medicines to treat seizures like carbamazepine, ethotoin, felbamate, oxcarbazepine, phenytoin, topiramate -modafinil -pioglitazone -rifabutin -rifampin -rifapentine -some medicines to treat HIV infection like atazanavir, indinavir, lopinavir, nelfinavir, tipranavir, ritonavir -St. John's wort -warfarin This list may not describe all possible interactions. Give your health care provider a list of all  the medicines, herbs, non-prescription drugs, or dietary supplements you use. Also tell them if you smoke, drink alcohol, or use illegal drugs. Some items may interact with your  medicine. What should I watch for while using this medicine? Visit your doctor or health care professional for regular check ups. See your doctor if you or your partner has sexual contact with others, becomes HIV positive, or gets a sexual transmitted disease. This product does not protect you against HIV infection (AIDS) or other sexually transmitted diseases. You can check the placement of the IUD yourself by reaching up to the top of your vagina with clean fingers to feel the threads. Do not pull on the threads. It is a good habit to check placement after each menstrual period. Call your doctor right away if you feel more of the IUD than just the threads or if you cannot feel the threads at all. The IUD may come out by itself. You may become pregnant if the device comes out. If you notice that the IUD has come out use a backup birth control method like condoms and call your health care provider. Using tampons will not change the position of the IUD and are okay to use during your period. What side effects may I notice from receiving this medicine? Side effects that you should report to your doctor or health care professional as soon as possible: -allergic reactions like skin rash, itching or hives, swelling of the face, lips, or tongue -fever, flu-like symptoms -genital sores -high blood pressure -no menstrual period for 6 weeks during use -pain, swelling, warmth in the leg -pelvic pain or tenderness -severe or sudden headache -signs of pregnancy -stomach cramping -sudden shortness of breath -trouble with balance, talking, or walking -unusual vaginal bleeding, discharge -yellowing of the eyes or skin Side effects that usually do not require medical attention (report to your doctor or health care professional if they continue or are bothersome): -acne -breast pain -change in sex drive or performance -changes in weight -cramping, dizziness, or faintness while the device is being  inserted -headache -irregular menstrual bleeding within first 3 to 6 months of use -nausea This list may not describe all possible side effects. Call your doctor for medical advice about side effects. You may report side effects to FDA at 1-800-FDA-1088. Where should I keep my medicine? This does not apply. NOTE: This sheet is a summary. It may not cover all possible information. If you have questions about this medicine, talk to your doctor, pharmacist, or health care provider.    2016, Elsevier/Gold Standard. (2011-04-26 13:54:04)  Endometriosis Endometriosis is a condition in which the tissue that lines the uterus (endometrium) grows outside of its normal location. The tissue may grow in many locations close to the uterus, but it commonly grows on the ovaries, fallopian tubes, vagina, or bowel. Because the uterus expels, or sheds, its lining every menstrual cycle, there is bleeding wherever the endometrial tissue is located. This can cause pain because blood is irritating to tissues not normally exposed to it.  CAUSES  The cause of endometriosis is not known.  SIGNS AND SYMPTOMS  Often, there are no symptoms. When symptoms are present, they can vary with the location of the displaced tissue. Various symptoms can occur at different times. Although symptoms occur mainly during a woman's menstrual period, they can also occur midcycle and usually stop with menopause. Some people may go months with no symptoms at all. Symptoms may include:   Back or abdominal pain.  Heavier bleeding during periods.   Pain during intercourse.   Painful bowel movements.   Infertility. DIAGNOSIS  Your health care provider will do a physical exam and ask about your symptoms. Various tests may be done, such as:   Blood tests and urine tests. These are done to help rule out other problems.   Ultrasound. This test is done to look for abnormal tissue.   An X-ray of the lower bowel (barium  enema).  Laparoscopy. In this procedure, a thin, lighted tube with a tiny camera on the end (laparoscope) is inserted into your abdomen. This helps your health care provider look for abnormal tissue to confirm the diagnosis. The health care provider may also remove a small piece of tissue (biopsy) from any abnormal tissue found. This tissue sample can then be sent to a lab so it can be looked at under a microscope. TREATMENT  Treatment will vary and may include:   Medicines to relieve pain. Nonsteroidal anti-inflammatory drugs (NSAIDs) are a type of pain medicine that can help to relieve the pain caused by endometriosis.  Hormonal therapy. When using hormonal therapy, periods are eliminated. This eliminates the monthly exposure to blood by the displaced endometrial tissue.   Surgery. Surgery may sometimes be done to remove the abnormal endometrial tissue. In severe cases, surgery may be done to remove the fallopian tubes, uterus, and ovaries (hysterectomy). HOME CARE INSTRUCTIONS   Take all medicines as directed by your health care provider. Do not take aspirin because it may increase bleeding when you are not on hormonal therapy.   Avoid activities that produce pain, including sexual activity. SEEK MEDICAL CARE IF:  You have pelvic pain before, after, or during your periods.  You have pelvic pain between periods that gets worse during your period.  You have pelvic pain during or after sex.  You have pelvic pain with bowel movements or urination, especially during your period.  You have problems getting pregnant.  You have a fever. SEEK IMMEDIATE MEDICAL CARE IF:   Your pain is severe and is not responding to pain medicine.   You have severe nausea and vomiting, or you cannot keep foods down.   You have pain that is limited to the right lower part of your abdomen.   You have swelling or increasing pain in your abdomen.   You see blood in your stool.  MAKE SURE YOU:    Understand these instructions.  Will watch your condition.  Will get help right away if you are not doing well or get worse.   This information is not intended to replace advice given to you by your health care provider. Make sure you discuss any questions you have with your health care provider.   Document Released: 03/23/2000 Document Revised: 04/16/2014 Document Reviewed: 11/21/2012 Elsevier Interactive Patient Education 2016 Elsevier Inc.  Uterine Fibroids Uterine fibroids are tissue masses (tumors) that can develop in the womb (uterus). They are also called leiomyomas. This type of tumor is not cancerous (benign) and does not spread to other parts of the body outside of the pelvic area, which is between the hip bones. Occasionally, fibroids may develop in the fallopian tubes, in the cervix, or on the support structures (ligaments) that surround the uterus. You can have one or many fibroids. Fibroids can vary in size, weight, and where they grow in the uterus. Some can become quite large. Most fibroids do not require medical treatment. CAUSES A fibroid can develop when a single uterine cell  keeps growing (replicating). Most cells in the human body have a control mechanism that keeps them from replicating without control. SIGNS AND SYMPTOMS Symptoms may include:   Heavy bleeding during your period.  Bleeding or spotting between periods.  Pelvic pain and pressure.  Bladder problems, such as needing to urinate more often (urinary frequency) or urgently.  Inability to reproduce offspring (infertility).  Miscarriages. DIAGNOSIS Uterine fibroids are diagnosed through a physical exam. Your health care provider may feel the lumpy tumors during a pelvic exam. Ultrasonography and an MRI may be done to determine the size, location, and number of fibroids. TREATMENT Treatment may include:  Watchful waiting. This involves getting the fibroid checked by your health care provider to see  if it grows or shrinks. Follow your health care provider's recommendations for how often to have this checked.  Hormone medicines. These can be taken by mouth or given through an intrauterine device (IUD).  Surgery.  Removing the fibroids (myomectomy) or the uterus (hysterectomy).  Removing blood supply to the fibroids (uterine artery embolization). If fibroids interfere with your fertility and you want to become pregnant, your health care provider may recommend having the fibroids removed.  HOME CARE INSTRUCTIONS  Keep all follow-up visits as directed by your health care provider. This is important.  Take medicines only as directed by your health care provider.  If you were prescribed a hormone treatment, take the hormone medicines exactly as directed.  Do not take aspirin, because it can cause bleeding.  Ask your health care provider about taking iron pills and increasing the amount of dark green, leafy vegetables in your diet. These actions can help to boost your blood iron levels, which may be affected by heavy menstrual bleeding.  Pay close attention to your period and tell your health care provider about any changes, such as:  Increased blood flow that requires you to use more pads or tampons than usual per month.  A change in the number of days that your period lasts per month.  A change in symptoms that are associated with your period, such as abdominal cramping or back pain. SEEK MEDICAL CARE IF:  You have pelvic pain, back pain, or abdominal cramps that cannot be controlled with medicines.  You have an increase in bleeding between and during periods.  You soak tampons or pads in a half hour or less.  You feel lightheaded, extra tired, or weak. SEEK IMMEDIATE MEDICAL CARE IF:  You faint.  You have a sudden increase in pelvic pain.   This information is not intended to replace advice given to you by your health care provider. Make sure you discuss any questions you  have with your health care provider.   Document Released: 03/23/2000 Document Revised: 04/16/2014 Document Reviewed: 09/22/2013 Elsevier Interactive Patient Education [employer].

## 2015-05-26 NOTE — Progress Notes (Signed)
Subjective  40 y.o. [id number] Single African American  female here for pelvic ultrasound for pelvic ultrasound for 3 weeks of pelvic pain, left greater than right.  Has had a cycle for 2 weeks now.  No bleeding at examination yesterday, but then bled heavily after her visit here.  Patient's last menstrual period was 05/11/2015 (exact date).   Hgb 11.4, TSH normal. Hx iron deficiency anemia.  Objective  Pelvic ultrasound images and report reviewed with patient.  Uterus - echogenicity consistent with possible adenomyosis.  5 fibroids approx 1 cm each.  EMS - 5.10 mm. Ovaries - normal.  Bilateral follicles.  Free fluid - no     Assessment  Pelvic pain.  Fibroids.  Possible adenomyosis.  Menorrhagia with irregular cycle.  This is an occurrence which is rare for patient.  Due for mammogram.  FH of breast/ovarian cancer.  Hx of DVT/PE.  Plan  Discussion of adenomyosis and fibroids.  Provera 10 mg po x 10 days.  Explained how it works and side effects. Patient will schedule her mammogram at [facility].  Phone number and location given. Discussed options including Micronor, Mirena, Depo Provera, Nexplanon, hysterectomy with removal of tubes and ovaries.  Patient most interested in [place].  These discussed in detail.  Referred to medical oncology.  Referred to genetics counseling.   ___25____ minutes face to face time of which over 50% was spent in counseling.   After visit summary to patient.

## 2015-05-28 ENCOUNTER — Encounter: Payer: Self-pay | Admitting: Obstetrics and Gynecology

## 2015-06-02 ENCOUNTER — Encounter: Payer: Self-pay | Admitting: Genetic Counselor

## 2015-06-02 ENCOUNTER — Other Ambulatory Visit: Payer: Managed Care, Other (non HMO)

## 2015-06-02 ENCOUNTER — Ambulatory Visit (HOSPITAL_BASED_OUTPATIENT_CLINIC_OR_DEPARTMENT_OTHER): Payer: Managed Care, Other (non HMO) | Admitting: Genetic Counselor

## 2015-06-02 DIAGNOSIS — Z83719 Family history of colon polyps, unspecified: Secondary | ICD-10-CM

## 2015-06-02 DIAGNOSIS — Z8041 Family history of malignant neoplasm of ovary: Secondary | ICD-10-CM

## 2015-06-02 DIAGNOSIS — Z8371 Family history of colonic polyps: Secondary | ICD-10-CM

## 2015-06-02 DIAGNOSIS — Z801 Family history of malignant neoplasm of trachea, bronchus and lung: Secondary | ICD-10-CM

## 2015-06-02 DIAGNOSIS — Z315 Encounter for genetic counseling: Secondary | ICD-10-CM

## 2015-06-02 DIAGNOSIS — Z8049 Family history of malignant neoplasm of other genital organs: Secondary | ICD-10-CM | POA: Diagnosis not present

## 2015-06-02 DIAGNOSIS — Z803 Family history of malignant neoplasm of breast: Secondary | ICD-10-CM

## 2015-06-02 NOTE — Progress Notes (Signed)
REFERRING PROVIDER: Yisroel Ramming, MD, Tiffany [name] PRIMARY PROVIDER:  Minerva Ends, MD  PRIMARY REASON FOR VISIT:  1. Family history of breast cancer in female   2. Family history of ovarian cancer   3. Family history of colonic polyps   4. Family history of cervical cancer   5. Family history of lung cancer      HISTORY OF PRESENT ILLNESS:   [name], a 40 y.o. female, was seen for a [facility] cancer genetics consultation at the request of Dr. Quincy [name] due to a maternal family history of ovarian and early-onset breast cancer.  [name] presents to clinic today to discuss the possibility of a hereditary predisposition to cancer, genetic testing, and to further clarify her future cancer risks, as well as potential cancer risks for family members.   [name] is a 40 y.o. female with no personal history of cancer.    CANCER HISTORY:   No history exists.     HORMONAL RISK FACTORS:  Menarche was at age 30-12.  First live birth at age 42-16.  OCP use for approximately 7 years.  Ovaries intact: yes.  Hysterectomy: no.  Menopausal status: premenopausal/perimenopausal (periods currently irregular and experiencing some hot flashes)  HRT use: 2 months (vaginal use) Colonoscopy: no; not examined. Mammogram within the last year: typically receives every 3 years, but will have one this year. Number of breast biopsies: 0. Up to date with pelvic exams:  yes. Any excessive radiation exposure in the past:  no, but does report a history of secondhand smoke exposure  Past Medical History  Diagnosis Date  . Bruises easily   . IBD (inflammatory bowel disease)   . Migraine   . Cyst of ovary 2003  . H/O sickle cell trait   . Trichomonas   . Breast mass in female 2003  . Depression 2003  . DUB (dysfunctional uterine bleeding) 2003  . Menses, irregular 2003  . BV (bacterial vaginosis)   . Hx: UTI (urinary tract infection) 2003  . Low back pain 2003  . Obese 2003  . Vaginitis and  vulvovaginitis 2004  . H/O amenorrhea 2006  . GERD (gastroesophageal reflux disease) 2007  . H/O menorrhagia 2011  . Yeast vaginitis 04/24/2010  . Anxiety     TAKING MEDS; DR Tiffany [name]  . Hypertension 2005    RESOLVED WITH WT LOSS  . Abnormal Pap smear 1997    LAST PAP 10/2010  . Infection     OCC YEAST  . Anemia     CHRONIC  . Fibromyalgia 2004  . Fibroid     LONG TERM DX  . Blood dyscrasia     SICKLE CELL TRAIT  . Heart murmur 2007  . Arthritis   . Sacroiliitis ([place])   . Pulmonary embolism ([place]) 10/2014  . DVT, lower extremity ([place]) 10/2014    left leg  . Blood transfusion without reported diagnosis 2005    after vaginal delivery  . History of abnormal cervical Pap smear 2003    had colposcopy and cryo.  Follow up paps are normal.    Past Surgical History  Procedure Laterality Date  . Cystectomy Right     obtain date from patient, was not on history form dated 06/16/10--benign cyst removed Rt.ovary  . Laparoscopic gastric banding  05/20/2007  . Cervical cone biopsy  1997  . Wisdom tooth extraction    . Dilation and curettage of uterus    . Augmentation mammaplasty  2011  Silicone implants  . Cosmetic surgery  2012    thigh lift    Social History   Social History  . Marital Status: Single    Spouse Name: N/A  . Number of Children: 5  . Years of Education: 16   Occupational History  . CLINICAL RESEARCH    Social History Main Topics  . Smoking status: Never Smoker   . Smokeless tobacco: Never Used  . Alcohol Use: 1.2 oz/week    2 Glasses of wine per week  . Drug Use: No  . Sexual Activity:    Partners: Male    Birth Control/ Protection: Abstinence, None   Other Topics Concern  . Not on file   Social History Narrative     FAMILY HISTORY:  We obtained a detailed, 4-generation family history.  Significant diagnoses are listed below: Family History  Problem Relation Age of Onset  . Diabetes Mother   . Arthritis Mother   . Hyperlipidemia Mother   .  Other Mother     VARICOSE VEINS; DECREASED HEART RATE; +Hysterectomy for unspecified reason  . Fibromyalgia Mother   . Colon polyps Mother     unspecified number  . Heart disease Father   . Hypertension Father   . Alcohol abuse Father   . Drug abuse Father   . Stroke Father   . Ovarian cancer Sister     dx. <43y  . Cervical cancer Sister 29  . Multiple sclerosis Brother   . Thyroid disease Brother     hypothyroidism  . Cancer Maternal Grandmother 11    Lung; secondhand smoke  . Depression Maternal Grandmother   . Miscarriages / Stillbirths Maternal Grandmother   . Birth defects Maternal Aunt     EXTRA DIGITS; MUSCULAR DISEASE  . Early death Maternal Aunt 66  . COPD Maternal Grandfather   . Stroke Maternal Grandfather   . Ovarian cancer Maternal Aunt 59    Dec.age 9 Ovarian CA  . Early death Maternal Aunt     d. three days after birth; unknown cause  . Breast cancer Maternal Aunt 70  . Colon polyps Maternal Aunt     unspecified number  . Liver disease Paternal Aunt   . Ovarian cancer Maternal Aunt 33  . Breast cancer Other     (x2 maternal great aunts) dx. with breast cancers at unspecified ages  . Breast cancer Other     (x3 maternal 1st cousins, once-removed with breast cancer)  . Colon cancer Other     (x1) maternal first cousin, once-removed died of colon cancer in her 9s  . Heart attack Paternal Uncle     in mid-50s    [name] has five sons and three daughters, [name] between the ages of 21 and 81.  Her oldest son has one 29-monthson of his own.  Ms. HNewgenthas one full sister and one full brother.  Her sister died of ovarian cancer at the age of 459  This sister was also diagnosed with cervical cancer around the same time.  Ms. HHaroldbrother is currently 349  He has never been diagnosed with cancer but his medical history includes hypothyroidism and multiple sclerosis.  [name] is currently 89  She has never been diagnosed with cancer.  She has a history  of colon polyps (unspecified number) and a history of a hysterectomy for an unspecified reason.  [name] died of a heart attack at the age of 40  [name]  has two full sisters who are still living and are in their early 31s.  Both of these sisters have histories of cancer--one was diagnosed with ovarian cancer at 45 and the other was diagnosed with breast cancer at 33.  The maternal aunt with a history of breast cancer also has a history of colon polyps (unspecified number).  Ms. Double mother also had a twin who passed away 3 days after birth from an unknown cause and another full sister who died at age 45 of "rickets/some kind of muscle disease".  Ms. Hotz reports no cancer history for any of her maternal first cousins.  Ms. Gatling maternal grandmother died of lung cancer at 3.  Her grandmother was not a smoker, but she was exposed to secondhand smoke.  Ms. Kley maternal grandmother had four full sisters.  Two of these sisters had a history of breast cancer and also had daughters with breast cancer.  There is also a maternal first cousin, once-removed (daughter of an unaffected maternal great aunt) who died of colon cancer in her 47s.  Ms. Golinski maternal grandfather died at age 73.  Ms. Wirkkala has no information for his brothers and sister.  Ms. Constantin father had two full brothers and one full sister.  His sister died of liver disease in her late 80s.  One brother died in his late 70s of an unspecified cause and another brother died of a heart attack in his mid-50s.  Ms. Flath also reports no cancer history for any paternal first cousins.  Ms. Whittlesey paternal grandmother died in her 33s, attributed to high blood pressure.  Ms. Watson paternal grandfather died in his early 51s.  Ms. Stiggers has limited information for her paternal great aunts/uncles and great grandparents.    Patient's maternal and paternal ancestors are of [place] and Caucasian - [place] descent. There  is no reported Ashkenazi Jewish ancestry. There is no known consanguinity.  GENETIC COUNSELING ASSESSMENT: AANYAH LOA is a 40 y.o. female with a family history which is somewhat suggestive of a hereditary breast/ovarian cancer syndrome and predisposition to cancer. We, therefore, discussed and recommended the following at today's visit.   DISCUSSION: We reviewed the characteristics, features and inheritance patterns of hereditary cancer syndromes, particularly those caused by mutations within the BRCA1/2 and Lynch syndrome genes. We also discussed genetic testing, including the appropriate family members to test, the process of testing, insurance coverage and turn-around-time for results.  We discussed that the best people to test first in the family are those who have a history of cancer because this provides the most informative results.  Thus, a negative result for Ms. Tremblay will not rule out a genetic cause for the family history of breast and ovarian cancer.  We discussed the implications of a negative, positive and/or variant of uncertain significant result. In consideration of the family history of breast and ovarian cancer and family history of colon polyps, we recommended Ms. Fagin pursue genetic testing for the 42- gene [facility] Common Hereditary Cancers Panel (Breast, Gyn, GI) through [facility] in [place], [place].  The 42-gene [facility] Common Hereditary Cancers Panel (Breast, Gyn, GI) offered by [facility] includes sequencing and deletion/duplication analysis for the following genes: APC, ATM, AXIN2, BARD1, BMPR1A, BRCA1, BRCA2, BRIP1, CDH1, CDKN2A, CHEK2, DICER1, EPCAM, GREM1, KIT, MEN1, MLH1, MSH2, MSH6, MUTYH, NBN, NF1, PALB2, PDGFRA, PMS2, POLD1, POLE, PTEN, RAD50, RAD51C, RAD51D, SDHA, SDHB, SDHC, SDHD, SMAD4, SMARCA4, STK11, TP53, TSC1, TSC2, and VHL.  Based on Ms.  Bassette's family history of cancer, she meets medical criteria for genetic testing. Despite that she meets  criteria, she may still have an out of pocket cost. We discussed that if her out of pocket cost for testing is over $100, the laboratory will call and confirm whether she wants to proceed with testing.  If the out of pocket cost of testing is less than $100 she will be billed by the genetic testing laboratory.   Based on the patient's personal and family history, a statistical model (IBIS/Tyrer-Cuzick) and literature data were used to estimate her risk of developing breast cancer. These estimate her lifetime risk of developing breast cancer to be approximately 15.3% to 20.2%. This estimation does not take into account any genetic testing results.  The patient's lifetime breast cancer risk is a preliminary estimate based on available information using one of several models endorsed by the [place] (ACS). The ACS recommends consideration of breast MRI screening as an adjunct to mammography for patients at high risk (defined as 20% or greater lifetime risk). A more detailed breast cancer risk assessment can be considered, if clinically indicated.   PLAN: After considering the risks, benefits, and limitations, Ms. Persico  provided informed consent to pursue genetic testing and the blood sample was sent to [facility] for analysis of the 42-gene [facility] Common Hereditary Cancers Panel (Breast, Gyn, GI). Results should be available within approximately 2-3 weeks' time, at which point they will be disclosed by telephone to Ms. Clevenger, as will any additional recommendations warranted by these results. Ms. Walla will receive a summary of her genetic counseling visit and a copy of her results once available. This information will also be available in Epic. We encouraged Ms. Chaires to remain in contact with cancer genetics annually so that we can continuously update the family history and inform her of any changes in cancer genetics and testing that may be of benefit for her family. Ms. Guadiana  questions were answered to her satisfaction today. Our contact information was provided should additional questions or concerns arise.  Thank you for the referral and allowing [place] to share in the [name] of your patient.   Jeanine Luz, MS Genetic Counselor [email or web address] Phone: [phone]  The patient was seen for a total of 60 minutes in face-to-face genetic counseling.  This patient was discussed with Drs. Magrinat, Lindi Adie and/or Burr Medico who agrees with the above.    _______________________________________________________________________ For Office Staff:  Number of people involved in session: 1 Was an Intern/ student involved with case: no

## 2015-06-03 ENCOUNTER — Ambulatory Visit: Payer: Managed Care, Other (non HMO)

## 2015-06-03 ENCOUNTER — Telehealth: Payer: Self-pay | Admitting: Oncology

## 2015-06-03 ENCOUNTER — Ambulatory Visit (HOSPITAL_BASED_OUTPATIENT_CLINIC_OR_DEPARTMENT_OTHER): Payer: Managed Care, Other (non HMO) | Admitting: Oncology

## 2015-06-03 VITALS — BP 144/95 | HR 88 | Temp 98.2°F | Resp 18 | Ht 65.5 in | Wt 179.8 lb

## 2015-06-03 DIAGNOSIS — D573 Sickle-cell trait: Secondary | ICD-10-CM | POA: Diagnosis not present

## 2015-06-03 DIAGNOSIS — I82432 Acute embolism and thrombosis of left popliteal vein: Secondary | ICD-10-CM

## 2015-06-03 DIAGNOSIS — I82C19 Acute embolism and thrombosis of unspecified internal jugular vein: Secondary | ICD-10-CM

## 2015-06-03 DIAGNOSIS — M79602 Pain in left arm: Secondary | ICD-10-CM | POA: Diagnosis not present

## 2015-06-03 DIAGNOSIS — Z809 Family history of malignant neoplasm, unspecified: Secondary | ICD-10-CM | POA: Diagnosis not present

## 2015-06-03 NOTE — Progress Notes (Signed)
Please see consult note.  

## 2015-06-03 NOTE — Consult Note (Signed)
Reason for Referral: Deep vein thrombosis.   HPI: 40 year old woman currently of [place] where she lived the majority of her life. She has a history of sickle cell trait as well as obesity status post banding procedure and significant weight loss. She also has family history significant for multiple malignancies and have been seen by genetic counseling regarding screening for future cancers. She also had laboratory screen for possible genetic abnormalities. She reports back in August 2016 with left leg pain and underwent lower extremity Dopplers on 11/18/2014 which showed small segment of the proximal popliteal vein involvement with the pain thrombosis. No evidence of DVT on the right. She was started on Xarelto which she took for 6 months. She denied any clear-cut provoking factors although she did travel to [place] in July 2016. She was not on oral contraceptives at that time. She completed Xarelto without any bleeding or clotting complications. She denied any previous deep vein thrombosis or superficial phlebitis. She denied any family history of thrombosis although her sister had blood clots but she had also cervical and breast cancer. She had multiple pregnancies without any postpartum deep vein thrombosis. She did have miscarriages but predominantly early trimester miscarriages. Overall she feels relatively well except for occasional left arm pain. Is able to perform activities of daily living without any decline.  She does not report any headaches, blurry vision, syncope or seizures. She does not report any fevers, chills or sweats. Does not report any chest pain, palpitation, orthopnea or leg edema. She does not report any cough, wheezing or hemoptysis. Does not report any nausea, vomiting or abdominal pain. She does not report any constipation or diarrhea. She does not report any frequency urgency or hesitancy. She does not report any arthralgias or myalgias. Remaining review of systems  unremarkable.   Past Medical History  Diagnosis Date  . Bruises easily   . IBD (inflammatory bowel disease)   . Migraine   . Cyst of ovary 2003  . H/O sickle cell trait   . Trichomonas   . Breast mass in female 2003  . Depression 2003  . DUB (dysfunctional uterine bleeding) 2003  . Menses, irregular 2003  . BV (bacterial vaginosis)   . Hx: UTI (urinary tract infection) 2003  . Low back pain 2003  . Obese 2003  . Vaginitis and vulvovaginitis 2004  . H/O amenorrhea 2006  . GERD (gastroesophageal reflux disease) 2007  . H/O menorrhagia 2011  . Yeast vaginitis 04/24/2010  . Anxiety     TAKING MEDS; DR [facility]  . Hypertension 2005    RESOLVED WITH WT LOSS  . Abnormal Pap smear 1997    LAST PAP 10/2010  . Infection     OCC YEAST  . Anemia     CHRONIC  . Fibromyalgia 2004  . Fibroid     LONG TERM DX  . Blood dyscrasia     SICKLE CELL TRAIT  . Heart murmur 2007  . Arthritis   . Sacroiliitis ([place])   . Pulmonary embolism ([place]) 10/2014  . DVT, lower extremity ([place]) 10/2014    left leg  . Blood transfusion without reported diagnosis 2005    after vaginal delivery  . History of abnormal cervical Pap smear 2003    had colposcopy and cryo.  Follow up paps are normal.  :  Past Surgical History  Procedure Laterality Date  . Cystectomy Right     obtain date from patient, was not on history form dated 06/16/10--benign cyst removed Rt.ovary  .  Laparoscopic gastric banding  05/20/2007  . Cervical cone biopsy  1997  . Wisdom tooth extraction    . Dilation and curettage of uterus    . Augmentation mammaplasty  [id number]    Silicone implants  . Cosmetic surgery  2012    thigh lift  :   Current outpatient prescriptions:  .  acetaminophen (TYLENOL) 325 MG tablet, Take 2 tablets (650 mg total) by mouth every 6 (six) hours as needed., Disp: 30 tablet, Rfl: 0 .  ALPRAZolam (XANAX XR) 1 MG 24 hr tablet, Take 1 mg by mouth as needed for anxiety., Disp: , Rfl:  .  ALPRAZolam (XANAX) 1 MG tablet,  Take 1 mg by mouth 3 (three) times daily as needed for anxiety. , Disp: , Rfl:  .  ARIPiprazole (ABILIFY) 10 MG tablet, Take 10 mg by mouth daily., Disp: , Rfl:  .  dexmethylphenidate (FOCALIN) 10 MG tablet, Take 1 tablet by mouth daily., Disp: , Rfl: 0 .  Dexmethylphenidate HCl (FOCALIN XR) 40 MG CP24, Take 1 capsule by mouth daily., Disp: , Rfl:  .  FLUTICASONE PROPIONATE, NASAL, NA, Place 1 spray into the nose as needed., Disp: , Rfl:  .  ibuprofen (ADVIL,MOTRIN) 800 MG tablet, Take 1 tablet (800 mg total) by mouth 3 (three) times daily. (Patient taking differently: Take 800 mg by mouth every 8 (eight) hours as needed. ), Disp: 21 tablet, Rfl: 0 .  lubiprostone (AMITIZA) 24 MCG capsule, Take 1 capsule (24 mcg total) by mouth daily with breakfast., Disp: 30 capsule, Rfl: 5 .  medroxyPROGESTERone (PROVERA) 10 MG tablet, Take 1 tablet (10 mg total) by mouth daily., Disp: 10 tablet, Rfl: 0 .  rivaroxaban (XARELTO) 20 MG TABS tablet, Take 1 tablet (20 mg total) by mouth daily with supper., Disp: 30 tablet, Rfl: 1 .  Vitamin D, Ergocalciferol, (DRISDOL) 50000 UNITS CAPS capsule, Take 50,000 Units by mouth 2 (two) times a week. Takes on sun and wed, Disp: , Rfl:  .  Vortioxetine HBr (BRINTELLIX) 20 MG TABS, Take 20 mg by mouth every evening. , Disp: , Rfl:  .  zolpidem (AMBIEN) 10 MG tablet, Take 10 mg by mouth at bedtime., Disp: , Rfl: :  Allergies  Allergen Reactions  . Flexeril [Cyclobenzaprine Hcl] Shortness Of Breath, Itching, Swelling and Rash    Swelling of throat  . Latex Shortness Of Breath, Itching and Rash  :  Family History  Problem Relation Age of Onset  . Diabetes Mother   . Arthritis Mother   . Hyperlipidemia Mother   . Other Mother     VARICOSE VEINS; DECREASED HEART RATE; +Hysterectomy for unspecified reason  . Fibromyalgia Mother   . Colon polyps Mother     unspecified number  . Heart disease Father   . Hypertension Father   . Alcohol abuse Father   . Drug abuse Father    . Stroke Father   . Ovarian cancer Sister     dx. <43y  . Cervical cancer Sister 71  . Multiple sclerosis Brother   . Thyroid disease Brother     hypothyroidism  . Cancer Maternal Grandmother 66    Lung; secondhand smoke  . Depression Maternal Grandmother   . Miscarriages / Stillbirths Maternal Grandmother   . Birth defects Maternal Aunt     EXTRA DIGITS; MUSCULAR DISEASE  . Early death Maternal Aunt 37  . COPD Maternal Grandfather   . Stroke Maternal Grandfather   . Ovarian cancer Maternal Aunt 80  Dec.age 44 Ovarian CA  . Early death Maternal Aunt     d. three days after birth; unknown cause  . Breast cancer Maternal Aunt 55  . Colon polyps Maternal Aunt     unspecified number  . Liver disease Paternal Aunt   . Ovarian cancer Maternal Aunt 41  . Breast cancer Other     (x2 maternal great aunts) dx. with breast cancers at unspecified ages  . Breast cancer Other     (x3 maternal 1st cousins, once-removed with breast cancer)  . Colon cancer Other     (x1) maternal first cousin, once-removed died of colon cancer in her 71s  . Heart attack Paternal Uncle     in mid-50s  :  Social History   Social History  . Marital Status: Single    Spouse Name: N/A  . Number of Children: 5  . Years of Education: 16   Occupational History  . CLINICAL RESEARCH    Social History Main Topics  . Smoking status: Never Smoker   . Smokeless tobacco: Never Used  . Alcohol Use: 1.2 oz/week    2 Glasses of wine per week  . Drug Use: No  . Sexual Activity:    Partners: Male    Birth Control/ Protection: Abstinence, None   Other Topics Concern  . Not on file   Social History Narrative  :  Pertinent items are noted in HPI.  Exam: ECOG 0 Blood pressure 144/95, pulse 88, temperature 98.2 F (36.8 C), temperature source Oral, resp. rate 18, height 5' 5.5" (1.664 m), weight 179 lb 12.8 oz (81.557 kg), last menstrual period 05/11/2015, SpO2 100 %. General appearance: alert and  cooperative. Without distress. Head: Normocephalic, without obvious abnormality Throat: lips, mucosa, and tongue normal; teeth and gums normal no ulcers or lesions. Neck: no adenopathy Back: negative Resp: clear to auscultation bilaterally Cardio: regular rate and rhythm, S1, S2 normal, no murmur, click, rub or gallop GI: soft, non-tender; bowel sounds normal; no masses,  no organomegaly Extremities: extremities normal, atraumatic, no cyanosis or edema Pulses: 2+ and symmetric Skin: Skin color, texture, turgor normal. No rashes or lesions  CBC    Component Value Date/Time   WBC 5.6 05/25/2015 1613   WBC 4.3 08/06/2008 1512   RBC 4.46 05/25/2015 1613   RBC 4.10 08/06/2008 1512   HGB 11.4* 05/25/2015 1613   HGB 12.7 08/06/2008 1512   HCT 34.7* 05/25/2015 1613   HCT 37.5 08/06/2008 1512   PLT 415* 05/25/2015 1613   PLT 279 08/06/2008 1512   MCV 77.8* 05/25/2015 1613   MCV 91.5 08/06/2008 1512   MCH 25.6* 05/25/2015 1613   MCH 31.0 08/06/2008 1512   MCHC 32.9 05/25/2015 1613   MCHC 33.8 08/06/2008 1512   RDW 17.1* 05/25/2015 1613   RDW 13.9 08/06/2008 1512   LYMPHSABS 3.0 05/25/2015 1613   LYMPHSABS 2.3 08/06/2008 1512   MONOABS 0.4 05/25/2015 1613   MONOABS 0.3 08/06/2008 1512   EOSABS 0.3 05/25/2015 1613   EOSABS 0.1 08/06/2008 1512   BASOSABS 0.1 05/25/2015 1613   BASOSABS 0.0 08/06/2008 1512      Assessment and Plan:   40 year old woman with the following issues:  1. Deep vein thrombosis diagnosed in August 2016. She was intubated with left leg pain and ultrasound of her lower extremity showed a left small deep vein thrombosis in the proximal popliteal vein. No evidence of right sided deep vein thrombosis. She was treated with Xarelto for 6 months and therapy  of been discontinued. She has no previous deep vein thrombosis episodes or pregnancy complications. She has no family history either.  The natural course of thrombophilia was discussed today and it appears that  her deep vein thrombosis could be provoked by long travel however it could also be classified as idiopathic or unprovoked. Given her complicated medical history and her risk of developing future thrombosis could be increased with inherited thrombophilia, I will screen her for hypercoagulable state. For the time being, I am agreeable to keep her off anticoagulation but she needs to be very vigilant about high thrombogenic states such as future pregnancies, surgeries or long travel. Early mobilization as well as prophylaxis after surgery or during pregnancy might be needed.  2. Family history of malignancy: She had evaluation by genetic counseling and laboratory testing have been obtained to rule out genetic syndromes. She certainly have increased risk of both breast and ovarian cancer and she will need frequent screening and she is agreeable to do so. The screening will include mammography as well as MRI given her age and history.  3. Close cell trait: She is interested in knowing if there is any correlation between her trait and periodic arm pain. I will obtain a hemoglobin electrophoresis for further evaluation of this particular trait and to identify her hemoglobin S proportion. Her hemoglobin is nearly normal and I do not think is contributing to her arm pain.  She'll follow-up with the next few weeks to discuss the results of this testing.

## 2015-06-03 NOTE — Telephone Encounter (Signed)
per po fto sch pt appt-gave pt copy of avs-sent back to lab °

## 2015-06-04 LAB — PROTEIN C, TOTAL: Protein C Antigen: 87 % (ref 60–150)

## 2015-06-05 LAB — CARDIOLIPIN ANTIBODIES, IGG, IGM, IGA
ANTICARDIOLIPIN IGM: 9 [MPL'U]/mL (ref 0–12)
Anticardiolipin Ab,IgG,Qn: <9 GPL U/mL (ref 0–14)

## 2015-06-06 LAB — HEMOGLOBINOPATHY EVALUATION
HEMOGLOBIN F QUANTITATION: 0 % (ref 0.0–2.0)
HGB A: 60.2 % — AB (ref 94.0–98.0)
HGB C: 0 %
HGB S: 36.7 % — ABNORMAL HIGH
Hemoglobin A2 Quantitation: 3.1 % (ref 0.7–3.1)

## 2015-06-07 LAB — BETA-2-GLYCOPROTEIN I ABS, IGG/M/A

## 2015-06-07 LAB — PROTEIN C ACTIVITY: Protein C-Functional: 97 % (ref 73–180)

## 2015-06-07 LAB — PROTEIN S ACTIVITY: Protein S-Functional: 60 % — ABNORMAL LOW (ref 63–140)

## 2015-06-07 LAB — ANTITHROMBIN III: ANTITHROMBIN ACTIVITY: 112 % (ref 75–135)

## 2015-06-07 LAB — PROTEIN S, TOTAL: PROTEIN S AG TOTAL: 90 % (ref 60–150)

## 2015-06-07 LAB — LUPUS ANTICOAGULANT PANEL
PTT-LA: 43.6 s (ref 0.0–43.6)
dRVVT: 39.1 s (ref 0.0–44.0)

## 2015-06-09 LAB — FACTOR 5 LEIDEN

## 2015-06-09 LAB — PROTHROMBIN GENE MUTATION

## 2015-06-10 ENCOUNTER — Telehealth: Payer: Self-pay | Admitting: Genetic Counselor

## 2015-06-10 ENCOUNTER — Ambulatory Visit
Admission: RE | Admit: 2015-06-10 | Discharge: 2015-06-10 | Disposition: A | Discharge status: Home / Self Care | Payer: Managed Care, Other (non HMO) | Source: Ambulatory Visit

## 2015-06-10 DIAGNOSIS — Z1231 Encounter for screening mammogram for malignant neoplasm of breast: Secondary | ICD-10-CM

## 2015-06-10 NOTE — Telephone Encounter (Signed)
Discussed with [name] that her genetic test results were negative for pathogenic mutations within any of 42 genes that would cause her to be at an increased risk for breast, ovarian, colon, and other related cancers.  Additionally, no uncertain changes were found.  Discussed that, because [name] herself has not had cancer, this result cannot totally rule out a genetic cause for the breast and ovarian cancers in the family.  We also cannot rule out a genetic risk for her, since our testing just might not be good enough at this time to determine a specific genetic cause in the family.  Discussed that further genetic testing of affected relatives would be helpful.  [name] maternal aunts were diagnosed with breast and ovarian cancer--both at 45--so they would be eligible for genetic counseling and testing.  One of these aunts lives in [place] and one lives in [place], so we discussed that they can use the [employer] website to find a Dietitian in their area.  [name] mother is also welcome to call me to discuss options for genetic testing.  We discussed that having a positive result that explains the family history of breast and ovarian cancer would be reassuring for [name].  However, we also discussed that she may still be eligible for additional screening options such as annual breast MRIs, if these aunts are not interested in testing, or if they do not have positive genetic test results.  Discussed that [name] could talk to her doctor about being seen for breast exams/screening here at the [facility].  [name] will reach out to her aunts about the recommendation for genetic testing.  She knows she is welcome to call or email with any questions.

## 2015-06-12 ENCOUNTER — Encounter: Payer: Self-pay | Admitting: Obstetrics and Gynecology

## 2015-06-13 ENCOUNTER — Ambulatory Visit: Payer: Self-pay | Admitting: Genetic Counselor

## 2015-06-13 DIAGNOSIS — Z803 Family history of malignant neoplasm of breast: Secondary | ICD-10-CM

## 2015-06-13 DIAGNOSIS — Z1379 Encounter for other screening for genetic and chromosomal anomalies: Secondary | ICD-10-CM | POA: Insufficient documentation

## 2015-06-13 DIAGNOSIS — Z8041 Family history of malignant neoplasm of ovary: Secondary | ICD-10-CM

## 2015-06-13 DIAGNOSIS — Z809 Family history of malignant neoplasm, unspecified: Secondary | ICD-10-CM

## 2015-06-13 NOTE — Progress Notes (Signed)
GENETIC TEST RESULT  HPI: Ms. Bloodsworth was previously seen in the [facility] clinic due to a family history of early-onset, breast, ovarian, and other cancers and concerns regarding a hereditary predisposition to cancer. Please refer to our prior cancer genetics clinic note from June 02, 2015 for more information regarding Ms. Orihuela's medical, social and family histories, and our assessment and recommendations, at the time. Ms. Gunnoe recent genetic test results were disclosed to her, as were recommendations warranted by these results. These results and recommendations are discussed in more detail below.  GENETIC TEST RESULTS: At the time of Ms. Popwell's visit on 06/02/15, we recommended she pursue genetic testing of the 42-gene [employer] Common Hereditary Cancers Panel (Breast, Gyn, GI).  The 42-gene [facility] Common Hereditary Cancers Panel (Breast, Gyn, GI) performed by [facility], [place]) includes sequencing and/or deletion/duplication analysis for the following genes: APC, ATM, AXIN2, BARD1, BMPR1A, BRCA1, BRCA2, BRIP1, CDH1, CDKN2A, CHEK2, DICER1, EPCAM, GREM1, KIT, MEN1, MLH1, MSH2, MSH6, MUTYH, NBN, NF1, PALB2, PDGFRA, PMS2, POLD1, POLE, PTEN, RAD50, RAD51C, RAD51D, SDHA, SDHB, SDHC, SDHD, SMAD4, SMARCA4, STK11, TP53, TSC1, TSC2, and VHL.  Those results are now back, the report date for which is June 09, 2015.  Genetic testing was normal, and did not reveal a deleterious mutation in these genes.  Additionally, no variants of uncertain significance (VUSes) were found.  The test report will be scanned into EPIC and will be located under the Results Review tab in the Pathology>Molecular Pathology section.   We discussed with Ms. Murdoch that since the current genetic testing is not perfect, it is possible there may be a gene mutation in one of these genes that current testing cannot detect. We also discussed, that it is possible that another gene that has not yet been  discovered, or that we have not yet tested, is responsible for the cancer diagnoses in the family, and it is, therefore, important to remain in touch with cancer genetics in the future so that we can continue to offer Ms. Kirkland the most up-to-date genetic testing.    CANCER SCREENING RECOMMENDATIONS: Given Ms. Dutkiewicz's personal and family histories, we must interpret these negative results with some caution.  Families with features suggestive of hereditary risk for cancer tend to have multiple family members with cancer, diagnoses in multiple generations and diagnoses before the age of 58. Ms. Kiernan family exhibits some of these features. Thus this result may simply reflect our current inability to detect all mutations within these genes or there may be a different gene that has not yet been discovered or tested.  Further genetic testing of affected family members, such as Ms. Miah's two maternal aunts who have been diagnosed with breast or ovarian cancer, is highly encouraged.  Additional information regarding their own genetic status could give [place] further insight into Ms. Thrun's own cancer risks.    If affected relatives test negative for a pathogenic mutation or if they decide not to have genetic testing, it may be reasonable for Ms. Wirsing to be followed by a high-risk breast cancer clinic.  A IBIS/Tyrer-Cuzick risk model calculated her lifetime breast cancer risk to be approximately 15.3-20.2%.  The higher end of this calculation would qualify Ms. Wojcicki for annual breast MRI, if she is interested.  We recommended Ms. Masse further discuss the option of being seen at the [facility] with her primary doctor.    RECOMMENDATIONS FOR FAMILY MEMBERS: Women in this family are at an increased risk of  developing cancer, over the general population risk, simply due to the family history of cancer. We recommended women in this family have a yearly mammogram beginning at age 22, or 79 years younger than the  earliest onset of cancer, an an annual clinical breast exam, and perform monthly breast self-exams. Women in this family should also have a gynecological exam as recommended by their primary provider. All family members should have a colonoscopy by age 51.  Women in the family should also make their primary doctors aware of the family history of ovarian cancer.  Based on Ms. Winegarden's family history, we recommended her two maternal aunts, who were diagnosed with breast cancer and ovarian cancer, both at the age of 8, have genetic counseling and testing. Ms. Nevitt will let us know if we can be of any assistance in coordinating genetic counseling and/or testing for these family members.  She reports that one of these aunts lives in [place] and that the other aunt lives in [place].  Thus, we also discussed that theses aunts can use the [employer] website to look-up a cancer genetic counselor by zip code.  Ms. Volcy mother may also be eligible for genetic counseling and testing, but it likely makes sense to start with the two aunt who have been diagnosed with breast/ovarian cancer.    FOLLOW-UP: Lastly, we discussed with Ms. Fahl that cancer genetics is a rapidly advancing field and it is possible that new genetic tests will be appropriate for her and/or her family members in the future. We encouraged her to remain in contact with cancer genetics on an annual basis so we can update her personal and family histories and let her know of advances in cancer genetics that may benefit this family.   Our contact number was provided. Ms. Joffe questions were answered to her satisfaction, and she knows she is welcome to call us at anytime with additional questions or concerns.   Jeanine Luz, MS, [facility] Certified Genetic Counselor [email or web address] Phone: [phone]

## 2015-07-05 DIAGNOSIS — D573 Sickle-cell trait: Secondary | ICD-10-CM | POA: Insufficient documentation

## 2015-07-05 DIAGNOSIS — J301 Allergic rhinitis due to pollen: Secondary | ICD-10-CM | POA: Insufficient documentation

## 2015-07-06 ENCOUNTER — Ambulatory Visit (HOSPITAL_BASED_OUTPATIENT_CLINIC_OR_DEPARTMENT_OTHER): Payer: Managed Care, Other (non HMO) | Admitting: Oncology

## 2015-07-06 VITALS — BP 137/92 | HR 92 | Temp 98.3°F | Resp 18 | Ht 65.5 in | Wt 173.9 lb

## 2015-07-06 DIAGNOSIS — I82432 Acute embolism and thrombosis of left popliteal vein: Secondary | ICD-10-CM | POA: Diagnosis not present

## 2015-07-06 DIAGNOSIS — Z809 Family history of malignant neoplasm, unspecified: Secondary | ICD-10-CM

## 2015-07-06 DIAGNOSIS — D573 Sickle-cell trait: Secondary | ICD-10-CM

## 2015-07-06 NOTE — Progress Notes (Signed)
Hematology and Oncology Follow Up Visit  Tiffany [name] IN:[id number] Jun 21, 1975 40 y.o. 07/06/2015 9:40 AM Minerva Ends, MDFunches, [name], MD   Principle Diagnosis: 40 year old woman with the following issues:  1. Deep vein thrombosis diagnosed in August 2016. She had a small proximal popliteal vein deep vein thrombosis on the left. She was anticoagulated with Xarelto for 6 months.   2. Family history of recurrent malignancies. She underwent genetic counseling and has no identifiable genetic mutation.  3. Sickle cell trait: No history of renal occlusive crises or complications.  Current therapy: Surveillance.  Interim History: Tiffany [name] presents today for a follow-up visit. Since her last visit she reports no recent complaints. She denied any bleeding or thrombotic events. She does report pain in her left forearm which has been persistent and not associated with any specific symptoms. She denied any relieving or exacerbating symptoms. She denied any recent trauma. She denied any recent surgeries or embolizations.  She does not report any headaches, blurry vision, syncope or seizures. She does not report any fevers, chills or sweats. Does not report any chest pain, palpitation, orthopnea or leg edema. She does not report any cough, wheezing or hemoptysis. Does not report any nausea, vomiting or abdominal pain. She does not report any constipation or diarrhea. She does not report any frequency urgency or hesitancy. Remaining review of systems unremarkable   Medications: I have reviewed the patient's current medications.  Current Outpatient Prescriptions  Medication Sig Dispense Refill  . acetaminophen (TYLENOL) 325 MG tablet Take 2 tablets (650 mg total) by mouth every 6 (six) hours as needed. 30 tablet 0  . ALPRAZolam (XANAX XR) 1 MG 24 hr tablet Take 1 mg by mouth as needed for anxiety.    . ALPRAZolam (XANAX) 1 MG tablet Take 1 mg by mouth 3 (three) times daily as needed for anxiety.      . ARIPiprazole (ABILIFY) 10 MG tablet Take 10 mg by mouth daily.    [name] dexmethylphenidate (FOCALIN) 10 MG tablet Take 1 tablet by mouth daily.  0  . Dexmethylphenidate HCl (FOCALIN XR) 40 MG CP24 Take 1 capsule by mouth daily.    [name] FLUTICASONE PROPIONATE, NASAL, NA Place 1 spray into the nose as needed.    [name] ibuprofen (ADVIL,MOTRIN) 800 MG tablet Take 1 tablet (800 mg total) by mouth 3 (three) times daily. (Patient taking differently: Take 800 mg by mouth every 8 (eight) hours as needed. ) 21 tablet 0  . lubiprostone (AMITIZA) 24 MCG capsule Take 1 capsule (24 mcg total) by mouth daily with breakfast. 30 capsule 5  . medroxyPROGESTERone (PROVERA) 10 MG tablet Take 1 tablet (10 mg total) by mouth daily. 10 tablet 0  . rivaroxaban (XARELTO) 20 MG TABS tablet Take 1 tablet (20 mg total) by mouth daily with supper. 30 tablet 1  . Vitamin D, Ergocalciferol, (DRISDOL) 50000 UNITS CAPS capsule Take 50,000 Units by mouth 2 (two) times a week. Takes on sun and wed    . Vortioxetine HBr (BRINTELLIX) 20 MG TABS Take 20 mg by mouth every evening.     . zolpidem (AMBIEN) 10 MG tablet Take 10 mg by mouth at bedtime.     No current facility-administered medications for this visit.     Allergies:  Allergies  Allergen Reactions  . Flexeril [Cyclobenzaprine Hcl] Shortness Of Breath, Itching, Swelling and Rash    Swelling of throat  . Latex Shortness Of Breath, Itching and Rash    Past Medical History, Surgical history,  Social history, and Family History were reviewed and updated.   Physical Exam: Blood pressure 137/92, pulse 92, temperature 98.3 F (36.8 C), temperature source Oral, resp. rate 18, height 5' 5.5" (1.664 m), weight 173 lb 14.4 oz (78.881 kg), SpO2 100 %. ECOG: 0 General appearance: alert and cooperative    Lab Results: Lab Results  Component Value Date   WBC 5.6 05/25/2015   HGB 11.4* 05/25/2015   HCT 34.7* 05/25/2015   MCV 77.8* 05/25/2015   PLT 415* 05/25/2015      Chemistry      Component Value Date/Time   NA 141 11/30/2014 1012   K 3.9 11/30/2014 1012   CL 106 11/30/2014 1012   CO2 27 11/30/2014 1012   BUN 7 11/30/2014 1012   CREATININE 0.79 11/30/2014 1012   CREATININE 0.68 06/29/2012 1630      Component Value Date/Time   CALCIUM 9.1 11/30/2014 1012   ALKPHOS 49 11/18/2014 1638   AST 12 11/18/2014 1638   ALT 7 11/18/2014 1638   BILITOT 0.5 11/18/2014 1638      Results for BHUVI, BALLIS (MRN [id number]) as of 07/06/2015 08:57  Ref. Range 06/03/2015 11:57  Hgb A Latest Ref Range: 94.0-98.0 % 60.2 (L)      Impression and Plan:   40 year old woman with the following issues:  1. Deep vein thrombosis diagnosed in August 2016. She presented with left leg pain and had a small deep vein thrombosis of the proximal popliteal vein. She received 6 months of Xarelto and have been off of it since then.  Her hypercoagulable panel was discussed today and showed no abnormalities. She does have a family history of thrombosis and related to malignancy. Given these findings, despite the negative hypercoagulable workup she still had any increased risk of recurrent thrombosis.  I counseled her about strategies to decrease the risk of recurrent blood clots. These include avoiding prolonged immobilization, thrombosis prophylaxis in cases of surgery or pregnancy. She also understands if she develops unprovoked thrombosis in the future, she might require lifetime anticoagulation.  2. Family history of malignancies: Her genetic testing did not reveal any identifiable genetic mutation. However, given her strong family history I have encouraged her to have aggressive breast cancer screening including mammography in addition to routine mammograms.  3. Sickle cell trait: She does have hemoglobin A of 60% with a hemoglobin S of 36.7. These findings support the diagnosis of sickle cell trait. The ramifications of these findings were reviewed with the patient today. It  is very likely she'll have a normal life expectancy associated with this finding that might influence her decision of any future pregnancy should she desire to have more children.  Specific health conditions associated with this finding that include splenic infarction and sequestrations associated with severe hypoxia and higher elevation. Caution should be used to travel to higher elevation and physical exertion associated with that.  Condition such as a renal medullary carcinoma also ramifications associated with this finding but these are rare tumors and does not necessarily change her management approach.  All her questions were answered today and I'll be happy to see her in the future as needed.  [facility], MD 3/29/20179:40 AM

## 2015-07-09 ENCOUNTER — Other Ambulatory Visit: Payer: Self-pay | Admitting: Family Medicine

## 2015-07-13 ENCOUNTER — Encounter: Payer: Self-pay | Admitting: Gastroenterology

## 2015-08-06 ENCOUNTER — Encounter (HOSPITAL_COMMUNITY): Payer: Self-pay | Admitting: Emergency Medicine

## 2015-08-06 ENCOUNTER — Emergency Department (HOSPITAL_COMMUNITY)
Admission: EM | Admit: 2015-08-06 | Discharge: 2015-08-07 | Disposition: A | Discharge status: Home / Self Care | Payer: Managed Care, Other (non HMO) | Attending: Emergency Medicine | Admitting: Emergency Medicine

## 2015-08-06 DIAGNOSIS — Z87440 Personal history of urinary (tract) infections: Secondary | ICD-10-CM | POA: Diagnosis not present

## 2015-08-06 DIAGNOSIS — F10120 Alcohol abuse with intoxication, uncomplicated: Secondary | ICD-10-CM | POA: Insufficient documentation

## 2015-08-06 DIAGNOSIS — F419 Anxiety disorder, unspecified: Secondary | ICD-10-CM | POA: Diagnosis not present

## 2015-08-06 DIAGNOSIS — Z7951 Long term (current) use of inhaled steroids: Secondary | ICD-10-CM | POA: Insufficient documentation

## 2015-08-06 DIAGNOSIS — F1310 Sedative, hypnotic or anxiolytic abuse, uncomplicated: Secondary | ICD-10-CM | POA: Insufficient documentation

## 2015-08-06 DIAGNOSIS — I10 Essential (primary) hypertension: Secondary | ICD-10-CM | POA: Insufficient documentation

## 2015-08-06 DIAGNOSIS — F10920 Alcohol use, unspecified with intoxication, uncomplicated: Secondary | ICD-10-CM

## 2015-08-06 DIAGNOSIS — R4182 Altered mental status, unspecified: Secondary | ICD-10-CM | POA: Diagnosis present

## 2015-08-06 DIAGNOSIS — M1990 Unspecified osteoarthritis, unspecified site: Secondary | ICD-10-CM | POA: Diagnosis not present

## 2015-08-06 DIAGNOSIS — Z86711 Personal history of pulmonary embolism: Secondary | ICD-10-CM | POA: Insufficient documentation

## 2015-08-06 DIAGNOSIS — Z8719 Personal history of other diseases of the digestive system: Secondary | ICD-10-CM | POA: Insufficient documentation

## 2015-08-06 DIAGNOSIS — Z862 Personal history of diseases of the blood and blood-forming organs and certain disorders involving the immune mechanism: Secondary | ICD-10-CM | POA: Insufficient documentation

## 2015-08-06 DIAGNOSIS — Z79899 Other long term (current) drug therapy: Secondary | ICD-10-CM | POA: Insufficient documentation

## 2015-08-06 DIAGNOSIS — Z7901 Long term (current) use of anticoagulants: Secondary | ICD-10-CM | POA: Insufficient documentation

## 2015-08-06 DIAGNOSIS — Z86718 Personal history of other venous thrombosis and embolism: Secondary | ICD-10-CM | POA: Diagnosis not present

## 2015-08-06 DIAGNOSIS — E669 Obesity, unspecified: Secondary | ICD-10-CM | POA: Insufficient documentation

## 2015-08-06 DIAGNOSIS — Z8742 Personal history of other diseases of the female genital tract: Secondary | ICD-10-CM | POA: Insufficient documentation

## 2015-08-06 DIAGNOSIS — Z91040 Latex allergy status: Secondary | ICD-10-CM | POA: Insufficient documentation

## 2015-08-06 DIAGNOSIS — F329 Major depressive disorder, single episode, unspecified: Secondary | ICD-10-CM | POA: Diagnosis not present

## 2015-08-06 DIAGNOSIS — Z86018 Personal history of other benign neoplasm: Secondary | ICD-10-CM | POA: Insufficient documentation

## 2015-08-06 DIAGNOSIS — E876 Hypokalemia: Secondary | ICD-10-CM

## 2015-08-06 DIAGNOSIS — Z8619 Personal history of other infectious and parasitic diseases: Secondary | ICD-10-CM | POA: Insufficient documentation

## 2015-08-06 DIAGNOSIS — R011 Cardiac murmur, unspecified: Secondary | ICD-10-CM | POA: Diagnosis not present

## 2015-08-06 LAB — CBC WITH DIFFERENTIAL/PLATELET
Basophils Absolute: 0 K/uL (ref 0.0–0.1)
Basophils Relative: 1 %
Eosinophils Absolute: 0.1 K/uL (ref 0.0–0.7)
Eosinophils Relative: 2 %
HCT: 32.2 % — ABNORMAL LOW (ref 36.0–46.0)
Hemoglobin: 10.7 g/dL — ABNORMAL LOW (ref 12.0–15.0)
Lymphocytes Relative: 28 %
Lymphs Abs: 1.1 K/uL (ref 0.7–4.0)
MCH: 26.2 pg (ref 26.0–34.0)
MCHC: 33.2 g/dL (ref 30.0–36.0)
MCV: 78.9 fL (ref 78.0–100.0)
Monocytes Absolute: 0.3 K/uL (ref 0.1–1.0)
Monocytes Relative: 7 %
Neutro Abs: 2.5 K/uL (ref 1.7–7.7)
Neutrophils Relative %: 62 %
Platelets: 287 K/uL (ref 150–400)
RBC: 4.08 MIL/uL (ref 3.87–5.11)
RDW: 16.7 % — ABNORMAL HIGH (ref 11.5–15.5)
WBC: 4 K/uL (ref 4.0–10.5)

## 2015-08-06 LAB — COMPREHENSIVE METABOLIC PANEL WITH GFR
ALT: 13 U/L — ABNORMAL LOW (ref 14–54)
AST: 21 U/L (ref 15–41)
Albumin: 3.6 g/dL (ref 3.5–5.0)
Alkaline Phosphatase: 45 U/L (ref 38–126)
Anion gap: 13 (ref 5–15)
BUN: <5 mg/dL — ABNORMAL LOW (ref 6–20)
CO2: 22 mmol/L (ref 22–32)
Calcium: 8.4 mg/dL — ABNORMAL LOW (ref 8.9–10.3)
Chloride: 101 mmol/L (ref 101–111)
Creatinine, Ser: 0.97 mg/dL (ref 0.44–1.00)
GFR calc Af Amer: >60 mL/min
GFR calc non Af Amer: >60 mL/min
Glucose, Bld: 88 mg/dL (ref 65–99)
Potassium: 2.5 mmol/L — CL (ref 3.5–5.1)
Sodium: 136 mmol/L (ref 135–145)
Total Bilirubin: 0.2 mg/dL — ABNORMAL LOW (ref 0.3–1.2)
Total Protein: 6.8 g/dL (ref 6.5–8.1)

## 2015-08-06 LAB — MAGNESIUM: Magnesium: 1.8 mg/dL (ref 1.7–2.4)

## 2015-08-06 LAB — LIPASE, BLOOD: Lipase: 23 U/L (ref 11–51)

## 2015-08-06 LAB — SALICYLATE LEVEL: Salicylate Lvl: <4 mg/dL (ref 2.8–30.0)

## 2015-08-06 LAB — ACETAMINOPHEN LEVEL: Acetaminophen (Tylenol), Serum: <10 ug/mL — ABNORMAL LOW (ref 10–30)

## 2015-08-06 LAB — ETHANOL: Alcohol, Ethyl (B): 109 mg/dL — ABNORMAL HIGH

## 2015-08-06 MED ORDER — POTASSIUM CHLORIDE 10 MEQ/100ML IV SOLN
10.0000 meq | Freq: Once | INTRAVENOUS | Status: AC
  Administered 2015-08-06: 10 meq via INTRAVENOUS
  Filled 2015-08-06: qty 100

## 2015-08-06 MED ORDER — POTASSIUM CHLORIDE CRYS ER 20 MEQ PO TBCR
60.0000 meq | EXTENDED_RELEASE_TABLET | Freq: Once | ORAL | Status: AC
  Administered 2015-08-06: 60 meq via ORAL
  Filled 2015-08-06: qty 3

## 2015-08-06 MED ORDER — SODIUM CHLORIDE 0.9 % IV BOLUS (SEPSIS)
1000.0000 mL | Freq: Once | INTRAVENOUS | Status: AC
  Administered 2015-08-06: 1000 mL via INTRAVENOUS

## 2015-08-06 NOTE — ED Notes (Signed)
Per EMS, pt was at an outdoor event beginning at 1500 today with c/o altered mental status, initially disoriented to time. Responsive to voice upon arrival. Hypotensive, EMS gave 1L fluid, BP-98/55 at this time. CBG-153, 98% RA

## 2015-08-06 NOTE — ED Provider Notes (Signed)
CSN: [id number]     Arrival date & time 08/06/15  2054 History   First MD Initiated Contact with Patient 08/06/15 2057     Chief Complaint  Patient presents with  . Altered Mental Status     (Consider location/radiation/quality/duration/timing/severity/associated sxs/prior Treatment) HPI  Tiffany [name] is a 40 y.o F with a pmhx of anxiety, PE, DVT presents to the ED today with the initial complaint of loss of consciousness. Level V caveat, intoxication. Per EMS pt was at a jazz festival outside and was consumign alcohol. They were called bc the pt had "passed out" on the grass. Pts friends had tried to throw water on her bc they thought that she was overheated. Pt was alert and able to answer all questions on EMS arrival. Her initial BP was 88/50 and she was given 1L fluid en route. Pt did not fall or hit her head. No urinary incontinence. Pt states that she drank several blue motorcycles. She denies taking other recreational drugs or medication. Pt states that she did vomit once. She denies dizziness, Cp, SOB, nausea, HA, blurry vision, pain, neck pain.    Past Medical History  Diagnosis Date  . Bruises easily   . IBD (inflammatory bowel disease)   . Migraine   . Cyst of ovary 2003  . H/O sickle cell trait   . Trichomonas   . Breast mass in female 2003  . Depression 2003  . DUB (dysfunctional uterine bleeding) 2003  . Menses, irregular 2003  . BV (bacterial vaginosis)   . Hx: UTI (urinary tract infection) 2003  . Low back pain 2003  . Obese 2003  . Vaginitis and vulvovaginitis 2004  . H/O amenorrhea 2006  . GERD (gastroesophageal reflux disease) 2007  . H/O menorrhagia 2011  . Yeast vaginitis 04/24/2010  . Anxiety     TAKING MEDS; DR [employer]  . Hypertension 2005    RESOLVED WITH WT LOSS  . Abnormal Pap smear 1997    LAST PAP 10/2010  . Infection     OCC YEAST  . Anemia     CHRONIC  . Fibromyalgia 2004  . Fibroid     LONG TERM DX  . Blood dyscrasia     SICKLE CELL TRAIT   . Heart murmur 2007  . Arthritis   . Sacroiliitis ([place])   . Pulmonary embolism ([place]) 10/2014  . DVT, lower extremity ([place]) 10/2014    left leg  . Blood transfusion without reported diagnosis 2005    after vaginal delivery  . History of abnormal cervical Pap smear 2003    had colposcopy and cryo.  Follow up paps are normal.   Past Surgical History  Procedure Laterality Date  . Cystectomy Right     obtain date from patient, was not on history form dated 06/16/10--benign cyst removed Rt.ovary  . Laparoscopic gastric banding  05/20/2007  . Cervical cone biopsy  1997  . Wisdom tooth extraction    . Dilation and curettage of uterus    . Augmentation mammaplasty  [id number]    Silicone implants  . Cosmetic surgery  2012    thigh lift   Family History  Problem Relation Age of Onset  . Diabetes Mother   . Arthritis Mother   . Hyperlipidemia Mother   . Other Mother     VARICOSE VEINS; DECREASED HEART RATE; +Hysterectomy for unspecified reason  . Fibromyalgia Mother   . Colon polyps Mother     unspecified number  . Heart disease  Father   . Hypertension Father   . Alcohol abuse Father   . Drug abuse Father   . Stroke Father   . Ovarian cancer Sister     dx. <43y  . Cervical cancer Sister 78  . Multiple sclerosis Brother   . Thyroid disease Brother     hypothyroidism  . Cancer Maternal Grandmother 77    Lung; secondhand smoke  . Depression Maternal Grandmother   . Miscarriages / Stillbirths Maternal Grandmother   . Birth defects Maternal Aunt     EXTRA DIGITS; MUSCULAR DISEASE  . Early death Maternal Aunt 34  . COPD Maternal Grandfather   . Stroke Maternal Grandfather   . Ovarian cancer Maternal Aunt 70    Dec.age 40 Ovarian CA  . Early death Maternal Aunt     d. three days after birth; unknown cause  . Breast cancer Maternal Aunt 32  . Colon polyps Maternal Aunt     unspecified number  . Liver disease Paternal Aunt   . Ovarian cancer Maternal Aunt 44  . Breast cancer Other      (x2 maternal great aunts) dx. with breast cancers at unspecified ages  . Breast cancer Other     (x3 maternal 1st cousins, once-removed with breast cancer)  . Colon cancer Other     (x1) maternal first cousin, once-removed died of colon cancer in her 69s  . Heart attack Paternal Uncle     in mid-50s   Social History  Substance Use Topics  . Smoking status: Never Smoker   . Smokeless tobacco: Never Used  . Alcohol Use: 1.2 oz/week    2 Glasses of wine per week   OB History    Gravida Para Term Preterm AB TAB SAB Ectopic Multiple Living   12 6 6  6  6   6      Review of Systems  All other systems reviewed and are negative.     Allergies  Flexeril and Latex  Home Medications   Prior to Admission medications   Medication Sig Start Date End Date Taking? Authorizing Provider  acetaminophen (TYLENOL) 325 MG tablet Take 2 tablets (650 mg total) by mouth every 6 (six) hours as needed. 11/22/14   Varney Biles, MD  ALPRAZolam (XANAX XR) 1 MG 24 hr tablet Take 1 mg by mouth as needed for anxiety.    Historical Provider, MD  ALPRAZolam Duanne Moron) 1 MG tablet Take 1 mg by mouth 3 (three) times daily as needed for anxiety.     Historical Provider, MD  ARIPiprazole (ABILIFY) 10 MG tablet Take 10 mg by mouth daily.    Historical Provider, MD  dexmethylphenidate (FOCALIN) 10 MG tablet Take 1 tablet by mouth daily. 05/19/15   Historical Provider, MD  Dexmethylphenidate HCl (FOCALIN XR) 40 MG CP24 Take 1 capsule by mouth daily.    Historical Provider, MD  FLUTICASONE PROPIONATE, NASAL, NA Place 1 spray into the nose as needed.    Historical Provider, MD  ibuprofen (ADVIL,MOTRIN) 800 MG tablet Take 1 tablet (800 mg total) by mouth 3 (three) times daily. Patient taking differently: Take 800 mg by mouth every 8 (eight) hours as needed.  11/13/14   Montine Circle, PA-C  lubiprostone (AMITIZA) 24 MCG capsule Take 1 capsule (24 mcg total) by mouth daily with breakfast. 12/09/14   Boykin Nearing, MD   medroxyPROGESTERone (PROVERA) 10 MG tablet Take 1 tablet (10 mg total) by mouth daily. 05/26/15   [name], MD  rivaroxaban (  XARELTO) 20 MG TABS tablet Take 1 tablet (20 mg total) by mouth daily with supper. 02/11/15   Josalyn [name], MD  Vitamin D, Ergocalciferol, (DRISDOL) 50000 UNITS CAPS capsule Take 50,000 Units by mouth 2 (two) times a week. Takes on sun and wed    Historical Provider, MD  Vortioxetine HBr (BRINTELLIX) 20 MG TABS Take 20 mg by mouth every evening.     Historical Provider, MD  zolpidem (AMBIEN) 10 MG tablet Take 10 mg by mouth at bedtime.    Historical Provider, MD   BP 108/52 mmHg  Pulse 81  Temp(Src) 97.2 F (36.2 C) (Oral)  Resp 12  SpO2 100% Physical Exam  Constitutional: She is oriented to person, place, and time. She appears well-developed and well-nourished. No distress.  Patient appears clinically intoxicated  HENT:  Head: Normocephalic and atraumatic.  Mouth/Throat: No oropharyngeal exudate.  Eyes: Conjunctivae and EOM are normal. Pupils are equal, round, and reactive to light. Right eye exhibits no discharge. Left eye exhibits no discharge. No scleral icterus.  Neck: Normal range of motion. Neck supple.  Cardiovascular: Normal rate, regular rhythm, normal heart sounds and intact distal pulses.  Exam reveals no gallop and no friction rub.   No murmur heard. Pulmonary/Chest: Effort normal and breath sounds normal. No respiratory distress. She has no wheezes. She has no rales. She exhibits no tenderness.  Abdominal: Soft. Bowel sounds are normal. She exhibits no distension. There is no tenderness. There is no guarding.  Musculoskeletal: Normal range of motion. She exhibits no edema.  Neurological: She is alert and oriented to person, place, and time.  Strength 5/5 throughout. No sensory deficits.  No dysmetria.  Skin: Skin is warm and dry. No rash noted. She is not diaphoretic. No erythema. No pallor.  Psychiatric: She has a normal mood and  affect. Her behavior is normal.  Nursing note and vitals reviewed.   ED Course  Procedures (including critical care time) Labs Review Labs Reviewed  CBC WITH DIFFERENTIAL/PLATELET - Abnormal; Notable for the following:    Hemoglobin 10.7 (*)    HCT 32.2 (*)    RDW 16.7 (*)    All other components within normal limits  COMPREHENSIVE METABOLIC PANEL - Abnormal; Notable for the following:    Potassium 2.5 (*)    BUN <5 (*)    Calcium 8.4 (*)    ALT 13 (*)    Total Bilirubin 0.2 (*)    All other components within normal limits  ETHANOL - Abnormal; Notable for the following:    Alcohol, Ethyl (B) 109 (*)    All other components within normal limits  ACETAMINOPHEN LEVEL - Abnormal; Notable for the following:    Acetaminophen (Tylenol), Serum <10 (*)    All other components within normal limits  LIPASE, BLOOD  SALICYLATE LEVEL  URINALYSIS, ROUTINE W REFLEX MICROSCOPIC (NOT AT [facility])  URINE RAPID DRUG SCREEN, HOSP PERFORMED  MAGNESIUM    Imaging Review No results found. I have personally reviewed and evaluated these images and lab results as part of my medical decision-making.   EKG Interpretation None      MDM   Final diagnoses:  Alcohol intoxication, uncomplicated ([place])  Hypokalemia    40 year old female presents the ED brought in by EMS for alcohol intoxication and possible passing out while at an outdoor music festival today. On presentation to the ED patient appears clinically intoxicated. However, she responds to verbal stimuli and is following commands. She is able to answer questions but is chronically  impaired due to alcohol intoxication. She states that "I drink too much today". She endorses drinking multiple blue motorcycles which consists of multiple different types of hard liquor. No neurological deficits on exam. She did not fall or hit her head. No CT head indicated at this time. All vital signs are stable. Ethanol level elevated at 109. Potassium level came  back critically low with a value of 2.5. She was given 60 mEq to replete. Upon reexamination patient is much more alert and oriented and able to ambulate without difficulty. No ataxia. EKG obtained which reveals mildly prolonged QT at 499. Do not believe this is the cause for patient's syncope today. We'll obtain magnesium level. Patient signed out to Harlene Ramus PA-C at shift change pending magnesium level. If magnesium level is also critically low, may require admission for observation and mag/K repletion. If normal and pt is able to ambulate without difficult pt may be discharged home with K supplementation. UA and UDS also pending. Plan to repeat EKG after K repletion to re-evaluate QTc.    Tiffany Spry [place], PA-C 08/06/15 [id number]  Sherwood Gambler, MD 08/11/15 2111

## 2015-08-06 NOTE — ED Notes (Signed)
Notified by lab of critical lab value for this pt. [place], [place] notified

## 2015-08-06 NOTE — ED Notes (Signed)
Pt A&Ox4 at this time.

## 2015-08-07 LAB — RAPID URINE DRUG SCREEN, HOSP PERFORMED
Amphetamines: NOT DETECTED
Barbiturates: NOT DETECTED
Benzodiazepines: POSITIVE — AB
Cocaine: NOT DETECTED
Opiates: NOT DETECTED
Tetrahydrocannabinol: NOT DETECTED

## 2015-08-07 LAB — URINALYSIS, ROUTINE W REFLEX MICROSCOPIC
Bilirubin Urine: NEGATIVE
Glucose, UA: NEGATIVE mg/dL
Hgb urine dipstick: NEGATIVE
Ketones, ur: NEGATIVE mg/dL
Leukocytes, UA: NEGATIVE
Nitrite: NEGATIVE
Protein, ur: NEGATIVE mg/dL
Specific Gravity, Urine: 1.003 — ABNORMAL LOW (ref 1.005–1.030)
pH: 6.5 (ref 5.0–8.0)

## 2015-08-07 MED ORDER — PENICILLIN V POTASSIUM 250 MG PO TABS: 250.0000 mg | ORAL_TABLET | Freq: Two times a day (BID) | ORAL | Status: DC

## 2015-08-07 MED ORDER — POTASSIUM CHLORIDE ER 10 MEQ PO TBCR: 20.0000 meq | EXTENDED_RELEASE_TABLET | Freq: Two times a day (BID) | ORAL | Status: DC

## 2015-08-07 NOTE — Discharge Instructions (Signed)
Take your potassium pills as prescribed. Recommend following up with your primary care provider in 5 days to have a potassium recheck. Please return to the Emergency Department if symptoms worsen or new onset of chest pain, difficulty breathing, nausea, vomiting, diarrhea, dizziness, syncope.

## 2015-08-07 NOTE — ED Provider Notes (Signed)
Hand-off from [employer], PA-C. Magnesium, UA pending.  Briefly patient is a 40 year old female with past medical history of anxiety, PE, DVT who presents to the ED via EMS with initial complaint of LOC. Level V caveat, intoxication. Patient reportedly passed out on the grass at a jazz festival after consuming alcohol. Patient was alert and verbal on EMS arrival. Initial BP 80/50, EMS administered 1 L fluids in route. Denies fall, head injury. Patient reports shrinking several drinks. Endorses one episode of vomiting. Denies any other pain or complaints. On initial exam patient appeared clinically intoxicated, remaining exam unremarkable. No neuro deficits.  Initial potassium 2.5. Patient was given 60 mEq PO and 84mEq IV to replete in ED. EKG showed mildly prolonged QT at 499.   Magnesium 1.8. UA unremarkable. On my initial examination, pt reports she is feeling fine and ready to go home. Pt able to stand and ambulate in the ED and appears clinically sober. Discussed case with Dr. Regenia Skeeter who recommends discharging pt home with PO potassium and having pt follow up with her PCP next week to have her potassium rechecked. Discussed results and plan for d/c with pt. Discussed strict return precautions with pt.     Chesley Noon [place], [place] 08/07/15 [id number]  Sherwood Gambler, MD 08/11/15 1946

## 2015-08-15 DIAGNOSIS — E876 Hypokalemia: Secondary | ICD-10-CM | POA: Insufficient documentation

## 2015-09-08 ENCOUNTER — Encounter: Payer: Self-pay | Admitting: Gastroenterology

## 2015-09-08 ENCOUNTER — Other Ambulatory Visit: Payer: Self-pay

## 2015-09-08 ENCOUNTER — Ambulatory Visit (INDEPENDENT_AMBULATORY_CARE_PROVIDER_SITE_OTHER): Payer: Managed Care, Other (non HMO) | Admitting: Gastroenterology

## 2015-09-08 ENCOUNTER — Other Ambulatory Visit (INDEPENDENT_AMBULATORY_CARE_PROVIDER_SITE_OTHER): Payer: Managed Care, Other (non HMO)

## 2015-09-08 VITALS — BP 118/72 | HR 64 | Ht 65.0 in | Wt 158.0 lb

## 2015-09-08 DIAGNOSIS — R112 Nausea with vomiting, unspecified: Secondary | ICD-10-CM

## 2015-09-08 DIAGNOSIS — D509 Iron deficiency anemia, unspecified: Secondary | ICD-10-CM

## 2015-09-08 DIAGNOSIS — R1114 Bilious vomiting: Secondary | ICD-10-CM | POA: Diagnosis not present

## 2015-09-08 DIAGNOSIS — E876 Hypokalemia: Secondary | ICD-10-CM

## 2015-09-08 DIAGNOSIS — K219 Gastro-esophageal reflux disease without esophagitis: Secondary | ICD-10-CM | POA: Diagnosis not present

## 2015-09-08 LAB — BASIC METABOLIC PANEL
BUN: 7 mg/dL (ref 6–23)
CHLORIDE: 99 meq/L (ref 96–112)
CO2: 31 mEq/L (ref 19–32)
Calcium: 10.2 mg/dL (ref 8.4–10.5)
Creatinine, Ser: 1.07 mg/dL (ref 0.40–1.20)
GFR: 72.35 mL/min (ref >60.00)
Glucose, Bld: 103 mg/dL — ABNORMAL HIGH (ref 70–99)
POTASSIUM: 2.8 meq/L — AB (ref 3.5–5.1)
Sodium: 138 mEq/L (ref 135–145)

## 2015-09-08 LAB — MAGNESIUM: MAGNESIUM: 2.2 mg/dL (ref 1.5–2.5)

## 2015-09-08 MED ORDER — POTASSIUM CHLORIDE CRYS ER 20 MEQ PO TBCR: 40.0000 meq | EXTENDED_RELEASE_TABLET | Freq: Two times a day (BID) | ORAL | Status: DC

## 2015-09-08 NOTE — Progress Notes (Signed)
Tiffany [name] [facility] Consult Note:  History: Tiffany [name] 09/08/2015  Referring physician: Phineas Inches, MD  Reason for consult/chief complaint: Gastroesophageal Reflux and Emesis   Subjective HPI:  This is a 40 year old woman referred by her PCP for chronic GERD and recently worsening vomiting. She reports one and a half to 2 years of GERD symptoms with pyrosis and regurgitation, but has been worse in about the last 6 weeks with no clear triggers. She is now having what she describes as vomiting, though it sounds more like severe regurgitation. It is somewhat worrisome that it also happens at night. There are feelings of food and liquid being stuck and she points to the sternal notch. She has lifelong constipation that has not recently worsened, and she takes an occasional amitiza for that. She denies rectal bleeding. Tiffany [name] underwent gastric banding in 2009, and reports that about 6 months ago some more fluid was instilled into the band at the surgical clinic. She believes she has lost almost 10 pounds in the last few weeks due to these worsen symptoms. Of note, she was in the ED about a month ago for syncope and dehydration with hypokalemia after becoming intoxicated at an outdoor concert. She reports that she just had too much to drink that day but that is not something that occurs regularly. ROS:  Review of Systems  Constitutional: Negative for appetite change and unexpected weight change.  HENT: Negative for mouth sores and voice change.   Eyes: Negative for pain and redness.  Respiratory: Negative for cough and shortness of breath.   Cardiovascular: Negative for chest pain and palpitations.  Genitourinary: Negative for dysuria and hematuria.  Musculoskeletal: Negative for myalgias and arthralgias.  Skin: Negative for pallor and rash.  Neurological: Negative for weakness and headaches.  Hematological: Negative for adenopathy.  Psychiatric/Behavioral: Positive for dysphoric  mood.     Past Medical History: Past Medical History  Diagnosis Date  . Bruises easily   . IBD (inflammatory bowel disease)   . Migraine   . Cyst of ovary 2003  . H/O sickle cell trait   . Trichomonas   . Breast mass in female 2003  . Depression 2003  . DUB (dysfunctional uterine bleeding) 2003  . Menses, irregular 2003  . BV (bacterial vaginosis)   . Hx: UTI (urinary tract infection) 2003  . Low back pain 2003  . Obese 2003  . Vaginitis and vulvovaginitis 2004  . H/O amenorrhea 2006  . GERD (gastroesophageal reflux disease) 2007  . H/O menorrhagia 2011  . Yeast vaginitis 04/24/2010  . Anxiety     TAKING MEDS; DR Tiffany [name]  . Hypertension 2005    RESOLVED WITH WT LOSS  . Abnormal Pap smear 1997    LAST PAP 10/2010  . Infection     OCC YEAST  . Anemia     CHRONIC  . Fibromyalgia 2004  . Fibroid     LONG TERM DX  . Blood dyscrasia     SICKLE CELL TRAIT  . Heart murmur 2007  . Arthritis   . Sacroiliitis ([place])   . Pulmonary embolism ([place]) 10/2014  . DVT, lower extremity ([place]) 10/2014    left leg  . Blood transfusion without reported diagnosis 2005    after vaginal delivery  . History of abnormal cervical Pap smear 2003    had colposcopy and cryo.  Follow up paps are normal.  . Uterine leiomyoma   . Fibrositis   . IBS (irritable bowel syndrome)   . Sickle-cell  trait [facility])   She also has depression and takes Abilify as noted below   Past Surgical History: Past Surgical History  Procedure Laterality Date  . Cystectomy Right     obtain date from patient, was not on history form dated 06/16/10--benign cyst removed Rt.ovary  . Laparoscopic gastric banding  05/20/2007  . Cervical cone biopsy  1997  . Wisdom tooth extraction    . Dilation and curettage of uterus    . Augmentation mammaplasty  [id number]    Silicone implants  . Cosmetic surgery  2012    thigh lift     Family History: Family History  Problem Relation Age of Onset  . Diabetes Mother   . Arthritis Mother    . Hyperlipidemia Mother   . Other Mother     VARICOSE VEINS; DECREASED HEART RATE; +Hysterectomy for unspecified reason  . Fibromyalgia Mother   . Colon polyps Mother     unspecified number  . Heart disease Father   . Hypertension Father   . Alcohol abuse Father   . Drug abuse Father   . Stroke Father   . Ovarian cancer Sister     dx. <43y  . Cervical cancer Sister 15  . Multiple sclerosis Brother   . Thyroid disease Brother     hypothyroidism  . Lung cancer Maternal Grandmother 74    secondhand smoke  . Depression Maternal Grandmother   . Miscarriages / Stillbirths Maternal Grandmother   . Birth defects Maternal Aunt     EXTRA DIGITS; MUSCULAR DISEASE  . Early death Maternal Aunt 83  . COPD Maternal Grandfather   . Stroke Maternal Grandfather   . Ovarian cancer Maternal Aunt 90    Dec.age 34 Ovarian CA  . Early death Maternal Aunt     d. three days after birth; unknown cause  . Breast cancer Maternal Aunt 33  . Colon polyps Maternal Aunt     unspecified number  . Liver disease Paternal Aunt   . Ovarian cancer Maternal Aunt 52  . Breast cancer Other     (x2 maternal great aunts) dx. with breast cancers at unspecified ages  . Breast cancer Other     (x3 maternal 1st cousins, once-removed with breast cancer)  . Colon cancer Other     (x1) maternal first cousin, once-removed died of colon cancer in her 8s  . Heart attack Paternal Uncle     in mid-50s    Social History: Social History   Social History  . Marital Status: Single    Spouse Name: N/A  . Number of Children: 5  . Years of Education: 16   Occupational History  . CLINICAL RESEARCH    Social History Main Topics  . Smoking status: Never Smoker   . Smokeless tobacco: Never Used  . Alcohol Use: 1.2 oz/week    2 Glasses of wine per week  . Drug Use: No  . Sexual Activity:    Partners: Male    Birth Control/ Protection: Abstinence, None   Other Topics Concern  . None   Social History Narrative     Allergies: Allergies  Allergen Reactions  . Flexeril [Cyclobenzaprine Hcl] Shortness Of Breath, Itching, Swelling and Rash    Swelling of throat  . Latex Shortness Of Breath, Itching and Rash  . Other Other (See Comments)    RUBBER    Outpatient Meds: Current Outpatient Prescriptions  Medication Sig Dispense Refill  . ALPRAZolam (XANAX XR) 1 MG 24 hr tablet Take 1  mg by mouth as needed for anxiety.    . ALPRAZolam (XANAX) 1 MG tablet Take 1 mg by mouth 3 (three) times daily as needed for anxiety.     . ARIPiprazole (ABILIFY) 10 MG tablet Take 10 mg by mouth daily.    [name] dexmethylphenidate (FOCALIN) 10 MG tablet Take 1 tablet by mouth daily.  0  . Dexmethylphenidate HCl (FOCALIN XR) 40 MG CP24 Take 1 capsule by mouth daily.    [name] FLUTICASONE PROPIONATE, NASAL, NA Place 1 spray into the nose as needed.    . lubiprostone (AMITIZA) 24 MCG capsule Take 1 capsule (24 mcg total) by mouth daily with breakfast. 30 capsule 5  . Vitamin D, Ergocalciferol, (DRISDOL) 50000 UNITS CAPS capsule Take 50,000 Units by mouth 2 (two) times a week. Takes on sun and wed    . Vortioxetine HBr (BRINTELLIX) 20 MG TABS Take 20 mg by mouth every evening.     . zolpidem (AMBIEN) 10 MG tablet Take 10 mg by mouth at bedtime.     No current facility-administered medications for this visit.      ___________________________________________________________________ Objective  Exam:  BP 118/72 mmHg  Pulse 64  Ht 5\' 5"  (1.651 m)  Wt 158 lb (71.668 kg)  BMI 26.29 kg/m2   General: this is a(n) Well-appearing middle-aged woman, no muscle wasting, appears euvolemic. She has a somewhat  depressd afect   Eyes: sclera anicteric, no redness  ENT: oral mucosa moist without lesions, no cervical or supraclavicular lymphadenopathy, good dentition  CV: RRR without murmur, S1/S2, no JVD, no peripheral edema  Resp: clear to auscultation bilaterally, normal RR and effort noted  GI: soft, no tenderness, with active  bowel sounds. No guarding or palpable organomegaly noted. Banding port is felt in the right upper quadrant  Skin; warm and dry, no rash or jaundice noted  Neuro: awake, alert and oriented x 3. Normal gross motor function and fluent speech  Labs:  CBC    Component Value Date/Time   WBC 4.0 08/06/2015 2154   WBC 4.3 08/06/2008 1512   RBC 4.08 08/06/2015 2154   RBC 4.10 08/06/2008 1512   HGB 10.7* 08/06/2015 2154   HGB 12.7 08/06/2008 1512   HCT 32.2* 08/06/2015 2154   HCT 37.5 08/06/2008 1512   PLT 287 08/06/2015 2154   PLT 279 08/06/2008 1512   MCV 78.9 08/06/2015 2154   MCV 91.5 08/06/2008 1512   MCH 26.2 08/06/2015 2154   MCH 31.0 08/06/2008 1512   MCHC 33.2 08/06/2015 2154   MCHC 33.8 08/06/2008 1512   RDW 16.7* 08/06/2015 2154   RDW 13.9 08/06/2008 1512   LYMPHSABS 1.1 08/06/2015 2154   LYMPHSABS 2.3 08/06/2008 1512   MONOABS 0.3 08/06/2015 2154   MONOABS 0.3 08/06/2008 1512   EOSABS 0.1 08/06/2015 2154   EOSABS 0.1 08/06/2008 1512   BASOSABS 0.0 08/06/2015 2154   BASOSABS 0.0 08/06/2008 1512    CMP Latest Ref Rng 08/06/2015 11/30/2014 11/18/2014  Glucose 65 - 99 mg/dL 88 68 79  BUN 6 - 20 mg/dL <5(L) 7 6(L)  Creatinine 0.44 - 1.00 mg/dL 0.97 0.79 0.88  Sodium 135 - 145 mmol/L 136 141 140  Potassium 3.5 - 5.1 mmol/L 2.5(LL) 3.9 4.1  Chloride 101 - 111 mmol/L 101 106 102  CO2 22 - 32 mmol/L 22 27 27   Calcium 8.9 - 10.3 mg/dL 8.4(L) 9.1 8.8  Total Protein 6.5 - 8.1 g/dL 6.8 - 6.7  Total Bilirubin 0.3 - 1.2 mg/dL 0.2(L) - 0.5  Alkaline Phos 38 - 126 U/L 45 - 49  AST 15 - 41 U/L 21 - 12  ALT 14 - 54 U/L 13(L) - 7     Assessment: Encounter Diagnoses  Name Primary?  . Gastroesophageal reflux disease, esophagitis presence not specified Yes  . Bilious vomiting with nausea   . Hypokalemia   . Anemia, iron deficiency     Worsening GERD symptoms with recent vomiting, cause is unclear. It could be mechanical such as a stricture at the banding site or ulceration  from the band itself. Worsening reflux could've caused esophageal stricture leading to dysphagia. I wonder if there possibly medicine side effects, though none of her medicines or doses seems to recently been changed. She is iron deficient, which seems most likely due to menstrual blood loss, but also raises concern for possible gastric band ulceration. I do not know if her hypokalemia has been chronic or fissure was just acute on the ED visit as noted above. Plan:  EGD with possible esophageal dilation tomorrow if her potassium level is good.  The benefits and risks of the planned procedure were described in detail with the patient or (when appropriate) their health [name] proxy.  Risks were outlined as including, but not limited to, bleeding, infection, perforation, adverse medication reaction leading to cardiac or pulmonary decompensation, or pancreatitis (if ERCP).  The limitation of incomplete mucosal visualization was also discussed.  No guarantees or warranties were given.  BMP checked today  Thank you for the courtesy of this consult.  Please call me with any questions or concerns.  Nelida Meuse III  CC: Phineas Inches, MD

## 2015-09-08 NOTE — Patient Instructions (Addendum)
If you are age 42 or older, your body mass index should be between 23-30. Your Body mass index is 26.29 kg/(m^2). If this is out of the aforementioned range listed, please consider follow up with your Primary Care Provider.  If you are age 1 or younger, your body mass index should be between 19-25. Your Body mass index is 26.29 kg/(m^2). If this is out of the aformentioned range listed, please consider follow up with your Primary Care Provider.   You have been scheduled for an endoscopy. Please follow written instructions given to you at your visit today. If you use inhalers (even only as needed), please bring them with you on the day of your procedure. Your physician has requested that you go to www.startemmi.com and enter the access code given to you at your visit today. This web site gives a general overview about your procedure. However, you should still follow specific instructions given to you by our office regarding your preparation for the procedure.  Your physician has requested that you go to the basement for the following lab work before leaving today: BMET   Thank you for choosing [facility]  Dr Wilfrid Lund III

## 2015-09-08 NOTE — Patient Instructions (Signed)
Pt contacted, notified and aware. She agrees to start potassium. EGD also cancelled. Pt will make a call to get a follow up with her PCP next week. Primary doctor to redraw labs for pt next week. Will fax a copy of labs to her PCP office. Pt agrees to have EGD next Friday. 09-16-2015. I will call the pt and give her new directions and time.

## 2015-09-09 ENCOUNTER — Encounter: Payer: Managed Care, Other (non HMO) | Admitting: Gastroenterology

## 2015-09-15 ENCOUNTER — Telehealth: Payer: Self-pay | Admitting: Gastroenterology

## 2015-09-15 NOTE — Telephone Encounter (Signed)
Labs have been reviewed potassium level at 3.6. Available in care everywhere. Per Dr Davonna Belling to proceed with EGD scheduled for 09-16-2015. Pt has been notified reviewed instructions with patient. She states clear understanding.

## 2015-09-16 ENCOUNTER — Ambulatory Visit (AMBULATORY_SURGERY_CENTER): Payer: Managed Care, Other (non HMO) | Admitting: Gastroenterology

## 2015-09-16 ENCOUNTER — Encounter: Payer: Self-pay | Admitting: Gastroenterology

## 2015-09-16 VITALS — BP 119/84 | HR 79 | Temp 97.8°F | Resp 13 | Ht 65.0 in | Wt 158.0 lb

## 2015-09-16 DIAGNOSIS — K219 Gastro-esophageal reflux disease without esophagitis: Secondary | ICD-10-CM

## 2015-09-16 MED ORDER — SODIUM CHLORIDE 0.9 % IV SOLN: 500.0000 mL | INTRAVENOUS | Status: DC

## 2015-09-16 NOTE — Op Note (Signed)
[facility] Patient Name: Tiffany [name] Procedure Date: 09/16/2015 10:07 AM MRN: [id number] Endoscopist: Mallie Mussel L. Loletha Carrow , MD Age: 40 Referring MD:  Date of Birth: [birth year] Gender: Female Procedure:                Upper GI endoscopy Indications:              Dysphagia, Vomiting, Weight loss Medicines:                Monitored Anesthesia Care Procedure:                Pre-Anesthesia Assessment:                           - Prior to the procedure, a History and Physical                            was performed, and patient medications and                            allergies were reviewed. The patient's tolerance of                            previous anesthesia was also reviewed. The risks                            and benefits of the procedure and the sedation                            options and risks were discussed with the patient.                            All questions were answered, and informed consent                            was obtained. Prior Anticoagulants: The patient has                            taken no previous anticoagulant or antiplatelet                            agents. ASA Grade Assessment: II - A patient with                            mild systemic disease. After reviewing the risks                            and benefits, the patient was deemed in                            satisfactory condition to undergo the procedure.                           After obtaining informed consent, the endoscope was  passed under direct vision. Throughout the                            procedure, the patient's blood pressure, pulse, and                            oxygen saturations were monitored continuously. The                            Model GIF-HQ190 [id number]) scope was introduced                            through the mouth, and advanced to the second part                            of duodenum. The upper GI endoscopy was                          accomplished without difficulty. The patient                            tolerated the procedure well. Scope In: Scope Out: Findings:                 The lumen of the esophagus was moderately dilated.                           Fluid was found in the lower third of the                            esophagus. This was entirely suctioned away.                           Extrinsic compression on the stomach was found in                            the cardia from the [place]. Water irrigation                            resulted in pooling at this site. It was also                            suctioned away. There was moderate resistance                            passing the scope through this area.                           Multiple sessile polyps (benign-appearing fundic                            gland polyps) were found in the gastric fundus.                           The examined duodenum was  normal. Complications:            No immediate complications. Estimated Blood Loss:     Estimated blood loss: none. Impression:               - Dilation in the entire esophagus.                           - Fluid in the lower third of the esophagus.                           - Extrinsic compression in the cardia.                           - Multiple gastric polyps.                           - Normal examined duodenum.                           - No specimens collected. Recommendation:           - Patient has a contact number available for                            emergencies. The signs and symptoms of potential                            delayed complications were discussed with the                            patient. Return to normal activities tomorrow.                            Written discharge instructions were provided to the                            patient.                           - Full liquid diet until surgical appointment.                           - Continue  present medications.                           - Evaluation in [facility]                            next week to remove all fluid from LapBand.                           Return to Dr. Loletha Carrow' office 3-4 weeks after that to                            re-assess symptoms. If persistent, then esophageal  motility study will be performed to see if                            persistent motility disorder present requiring                            complete removal of LapBand.                           Return to primary care physician early next week to                            recheck potassium level. Ford Peddie L. Loletha Carrow, MD 09/16/2015 10:43:00 AM This report has been signed electronically. CC Letter to:             Raylene Miyamoto, PA [facility])

## 2015-09-16 NOTE — Patient Instructions (Signed)
YOU HAD AN ENDOSCOPIC PROCEDURE TODAY AT [facility]:   Refer to the procedure report that was given to you for any specific questions about what was found during the examination.  If the procedure report does not answer your questions, please call your gastroenterologist to clarify.  If you requested that your care partner not be given the details of your procedure findings, then the procedure report has been included in a sealed envelope for you to review at your convenience later.  YOU SHOULD EXPECT: Some feelings of bloating in the abdomen. Passage of more gas than usual.  Walking can help get rid of the air that was put into your GI tract during the procedure and reduce the bloating. If you had a lower endoscopy (such as a colonoscopy or flexible sigmoidoscopy) you may notice spotting of blood in your stool or on the toilet paper. If you underwent a bowel prep for your procedure, you may not have a normal bowel movement for a few days.  Please Note:  You might notice some irritation and congestion in your nose or some drainage.  This is from the oxygen used during your procedure.  There is no need for concern and it should clear up in a day or so.  SYMPTOMS TO REPORT IMMEDIATELY:    Following upper endoscopy (EGD)  Vomiting of blood or coffee ground material  New chest pain or pain under the shoulder blades  Painful or persistently difficult swallowing  New shortness of breath  Fever of 100F or higher  Black, tarry-looking stools  For urgent or emergent issues, a gastroenterologist can be reached at any hour by calling [phone].   DIET: Your first meal following the procedure should be a small meal and then it is ok to progress to your normal diet. Heavy or fried foods are harder to digest and may make you feel nauseous or bloated.  Likewise, meals heavy in dairy and vegetables can increase bloating.  Drink plenty of fluids but you should avoid alcoholic beverages  for 24 hours.  ACTIVITY:  You should plan to take it easy for the rest of today and you should NOT DRIVE or use heavy machinery until tomorrow (because of the sedation medicines used during the test).    FOLLOW UP: Our staff will call the number listed on your records the next business day following your procedure to check on you and address any questions or concerns that you may have regarding the information given to you following your procedure. If we do not reach you, we will leave a message.  However, if you are feeling well and you are not experiencing any problems, there is no need to return our call.  We will assume that you have returned to your regular daily activities without incident.  If any biopsies were taken you will be contacted by phone or by letter within the next 1-3 weeks.  Please call us at [phone] if you have not heard about the biopsies in 3 weeks.    SIGNATURES/CONFIDENTIALITY: You and/or your care partner have signed paperwork which will be entered into your electronic medical record.  These signatures attest to the fact that that the information above on your After Visit Summary has been reviewed and is understood.  Full responsibility of the confidentiality of this discharge information lies with you and/or your care-partner.  Appointment with Dr. Excell Seltzer at [facility], Tuesday, June 13th at 4:30PM.  Schedule appointment with Dr. Loletha Carrow  after your appointment at [facility] for 3-4 weeks.  Full liquid diet until you see Dr.Hoxworth.  Sleep with head of bed up approx. 30 degrees to avoid aspiration.  Return to primary care early next week to recheck potassium level.

## 2015-09-16 NOTE — Progress Notes (Signed)
Patient awakening,vss,report to rn 

## 2015-09-19 ENCOUNTER — Telehealth: Payer: Self-pay

## 2015-09-19 NOTE — Telephone Encounter (Signed)
  Follow up Call-  Call back number 09/16/2015  Post procedure Call Back phone  # [phone]  Permission to leave phone message Yes     Patient questions:  Do you have a fever, pain , or abdominal swelling? No. Pain Score  0 *  Have you tolerated food without any problems? Yes.    Have you been able to return to your normal activities? Yes.    Do you have any questions about your discharge instructions: Diet   No. Medications  No. Follow up visit  No.  Do you have questions or concerns about your Care? No.  Actions: * If pain score is 4 or above: No action needed, pain <4.

## 2015-09-27 ENCOUNTER — Telehealth: Payer: Self-pay | Admitting: Gastroenterology

## 2015-09-28 NOTE — Telephone Encounter (Signed)
Left message on machine to call back  

## 2015-09-30 NOTE — Telephone Encounter (Signed)
10/10/15 1130 am Dr Loletha Carrow appt  Pt notified and she will discuss questions with Dr Loletha Carrow at her appt.

## 2015-10-10 ENCOUNTER — Ambulatory Visit (INDEPENDENT_AMBULATORY_CARE_PROVIDER_SITE_OTHER): Payer: Managed Care, Other (non HMO) | Admitting: Gastroenterology

## 2015-10-10 ENCOUNTER — Encounter: Payer: Self-pay | Admitting: Gastroenterology

## 2015-10-10 VITALS — BP 116/74 | HR 91 | Ht 66.0 in | Wt 157.0 lb

## 2015-10-10 DIAGNOSIS — Z9884 Bariatric surgery status: Secondary | ICD-10-CM

## 2015-10-10 DIAGNOSIS — R1310 Dysphagia, unspecified: Secondary | ICD-10-CM | POA: Diagnosis not present

## 2015-10-10 DIAGNOSIS — G43A0 Cyclical vomiting, not intractable: Secondary | ICD-10-CM | POA: Diagnosis not present

## 2015-10-10 DIAGNOSIS — R1115 Cyclical vomiting syndrome unrelated to migraine: Secondary | ICD-10-CM

## 2015-10-10 NOTE — Progress Notes (Signed)
[facility] Progress Note  Chief Complaint: Dysphagia and vomiting  Subjective History:  Tiffany [name] follows up with me today for dysphagia and vomiting. She had a prior lap band that had increased fluid placed last year. She was very symptomatic from an obstruction discovered at recent upper endoscopy. See that report for details. Scope was only able to pass the lap band indentation with significant pressure, and she also had esophageal dilatation with poor motility noted. We got her in to see the surgeon following week, but she told Dr. Excell Seltzer that she was feeling fine at that point. She noted that since the endoscopy she was able to swallow without any difficulty, and that she did not want any fluid removed from the band because she was concerned she might then gain weight. While this may been true at the time, within a few days she was back to having the same symptoms as before. She has good days and bad days with these symptoms, and is not sure if she has lost any more weight.  ROS: Cardiovascular:  no chest pain Respiratory: no dyspnea  The patient's Past Medical, Family and Social History were reviewed and are on file in the EMR.  Objective:  Med list reviewed  Vital signs in last 24 hrs: Filed Vitals:   10/10/15 1139  BP: 116/74  Pulse: 91  We have her weight recorded is stable at 157 pounds compared to a month ago.  Total 20 minute visit, all of which spent in discussion.   @ASSESSMENTPLANBEGIN @ Assessment: Encounter Diagnoses  Name Primary?  [name] Dysphagia Yes  . Non-intractable cyclical vomiting with nausea   . Hx of laparoscopic gastric banding    This patient has an achalasia-like physiology from a lap band that is too tight. At a bare minimum, and needs to be deflated, then symptoms as well as endoscopy with manometry reassessed about 2 months later. If  achalasia-like physiology has developed, the band will most likely need removal. Even then, the esophageal motility  will not necessarily return.  Plan:  She was instructed to return to the surgical practice soon as possible to have the lap band a completely deflated. She has a follow-up with me in 2 months.  Tiffany [name]  CC: Excell Seltzer, M.D.   [facility]

## 2015-10-10 NOTE — Patient Instructions (Signed)
Please follow up in 2 months. [phone]  If you are age 42 or older, your body mass index should be between 23-30. Your Body mass index is 25.35 kg/(m^2). If this is out of the aforementioned range listed, please consider follow up with your Primary Care Provider.  If you are age 43 or younger, your body mass index should be between 19-25. Your Body mass index is 25.35 kg/(m^2). If this is out of the aformentioned range listed, please consider follow up with your Primary Care Provider.   Thank you for choosing [facility]  Dr Wilfrid Lund III

## 2015-10-27 ENCOUNTER — Encounter: Payer: Self-pay | Admitting: Gastroenterology

## 2015-11-10 ENCOUNTER — Other Ambulatory Visit (INDEPENDENT_AMBULATORY_CARE_PROVIDER_SITE_OTHER): Payer: Self-pay | Admitting: Physician Assistant

## 2015-11-10 DIAGNOSIS — Z9884 Bariatric surgery status: Secondary | ICD-10-CM

## 2015-11-10 DIAGNOSIS — K317 Polyp of stomach and duodenum: Secondary | ICD-10-CM | POA: Insufficient documentation

## 2015-11-10 DIAGNOSIS — R0789 Other chest pain: Secondary | ICD-10-CM | POA: Insufficient documentation

## 2015-11-11 ENCOUNTER — Ambulatory Visit
Admission: RE | Admit: 2015-11-11 | Discharge: 2015-11-11 | Disposition: A | Discharge status: Home / Self Care | Payer: Managed Care, Other (non HMO) | Source: Ambulatory Visit | Attending: Physician Assistant | Admitting: Physician Assistant

## 2015-11-11 DIAGNOSIS — Z9884 Bariatric surgery status: Secondary | ICD-10-CM

## 2015-11-22 DIAGNOSIS — R609 Edema, unspecified: Secondary | ICD-10-CM | POA: Insufficient documentation

## 2015-12-23 DIAGNOSIS — R635 Abnormal weight gain: Secondary | ICD-10-CM | POA: Insufficient documentation

## 2015-12-27 DIAGNOSIS — R140 Abdominal distension (gaseous): Secondary | ICD-10-CM | POA: Insufficient documentation

## 2016-01-23 ENCOUNTER — Encounter: Payer: Self-pay | Admitting: Obstetrics and Gynecology

## 2016-01-23 ENCOUNTER — Ambulatory Visit (INDEPENDENT_AMBULATORY_CARE_PROVIDER_SITE_OTHER): Payer: Managed Care, Other (non HMO) | Admitting: Obstetrics and Gynecology

## 2016-01-23 VITALS — BP 128/74 | HR 100 | Ht 65.5 in | Wt 195.6 lb

## 2016-01-23 DIAGNOSIS — N911 Secondary amenorrhea: Secondary | ICD-10-CM | POA: Diagnosis not present

## 2016-01-23 DIAGNOSIS — N912 Amenorrhea, unspecified: Secondary | ICD-10-CM | POA: Diagnosis not present

## 2016-01-23 DIAGNOSIS — N760 Acute vaginitis: Secondary | ICD-10-CM

## 2016-01-23 DIAGNOSIS — K317 Polyp of stomach and duodenum: Secondary | ICD-10-CM

## 2016-01-23 LAB — POCT URINE PREGNANCY: PREG TEST UR: NEGATIVE

## 2016-01-23 NOTE — Progress Notes (Signed)
GYNECOLOGY  VISIT   HPI: 40 y.o.   Single  African American  female   [phone] with Patient's last menstrual period was 09/08/2015 (approximate).   here for vaginal discharge with some itching.    Clear watery discharge for 2 weeks.  A little itchy.  Some odor.  No burning.  Frequent urination but not painful. No OTC treatments.  No recent abx. No current sexual activity or for one year.   New dx of polyps in her stomach, fundic polyp syndrome. No biopsy was done.  Patient very concerned about this and wants a second opinion.   LMP was June 2017. Prior LMP was April 2017.  No shaving but plucks hair.   Patient had pelvic US in Feb. 2017 showing possible adenomyosis and 5 fibroids.  Ovaries were normal.   Had negative genetic testing in response to family history of breast and ovarian cancer.   Hx DVT/PE.  Negative work up with oncology.  UPT: Negative  GYNECOLOGIC HISTORY: Patient's last menstrual period was 09/08/2015 (approximate). Contraception:  Abstinence Menopausal hormone therapy:  n/a Last mammogram:  06-10-15 3D with saline implants/Density B/Neg/BiRads1:[facility] Last pap smear:   02-18-15 Neg:Neg HR HPV        OB History    Gravida Para Term Preterm AB Living   12 6 6   6 6    SAB TAB Ectopic Multiple Live Births   6       6         Patient Active Problem List   Diagnosis Date Noted  . Genetic testing 06/13/2015  . Family history of breast cancer in female 06/02/2015  . Family history of ovarian cancer 06/02/2015  . BV (bacterial vaginosis) 02/18/2015  . Hemorrhoid 02/18/2015  . Muscle pain 11/30/2014  . Left leg DVT ([place]) 11/18/2014  . Hx of laparoscopic gastric banding 01/29/2012  . Depression 12/19/2011  . Fibromyalgia 12/19/2011  . Arthritis 12/19/2011  . IBS (irritable bowel syndrome) 12/19/2011  . GERD (gastroesophageal reflux disease) 12/19/2011  . Fibroids 12/19/2011    Past Medical History:  Diagnosis Date  . Abnormal Pap  smear 1997   LAST PAP 10/2010  . Allergy    SEASONAL  . Anemia    CHRONIC  . 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 Arthritis   . Blood dyscrasia    SICKLE CELL TRAIT  . Blood transfusion without reported diagnosis 2005   after vaginal delivery  . Breast mass in female 2003  . Bruises easily   . BV (bacterial vaginosis)   . Cyst of ovary 2003  . Depression 2003  . DUB (dysfunctional uterine bleeding) 2003  . DVT, lower extremity ([place]) 10/2014   left leg  . Fibroid    LONG TERM DX  . Fibromyalgia 2004  . Fibrositis   . GERD (gastroesophageal reflux disease) 2007  . H/O amenorrhea 2006  . H/O menorrhagia 2011  . H/O sickle cell trait   . Heart murmur 2007  . History of abnormal cervical Pap smear 2003   had colposcopy and cryo.  Follow up paps are normal.  . Hx: UTI (urinary tract infection) 2003  . Hypertension 2005   RESOLVED WITH WT LOSS  . IBD (inflammatory bowel disease)   . IBS (irritable bowel syndrome)   . Infection    OCC YEAST  . Low back pain 2003  . Menses, irregular 2003  . Migraine   . Obese 2003  . Pulmonary embolism ([place])  10/2014  . Sacroiliitis ([place])   . Sickle cell anemia ([facility])   . Sickle-cell trait ([place])   . Trichomonas   . Uterine leiomyoma   . Vaginitis and vulvovaginitis 2004  . Yeast vaginitis 04/24/2010    Past Surgical History:  Procedure Laterality Date  . AUGMENTATION MAMMAPLASTY  [id number]   Silicone implants  . CERVICAL CONE BIOPSY  1997  . COSMETIC SURGERY  2012   thigh lift  . CYSTECTOMY Right    obtain date from patient, was not on history form dated 06/16/10--benign cyst removed Rt.ovary  . DILATION AND CURETTAGE OF UTERUS    . LAPAROSCOPIC GASTRIC BANDING  05/20/2007  . WISDOM TOOTH EXTRACTION      Current Outpatient Prescriptions  Medication Sig Dispense Refill  . ALPRAZolam (XANAX XR) 1 MG 24 hr tablet Take 1 mg by mouth as needed for anxiety.    . ALPRAZolam (XANAX) 1 MG tablet Take 1 mg by mouth 3 (three) times daily as needed  for anxiety.     . cetirizine (ZYRTEC) 10 MG tablet Take 10 mg by mouth.    . fluticasone (FLONASE) 50 MCG/ACT nasal spray 1 spray by Each Nare route Three (3) times a day as needed.    . Liraglutide -Weight Management (SAXENDA) 18 MG/3ML SOPN Week 1 give 0.6 mg daily, week 2 -- 1.2 mg daily, week 3 --1.8 mg daily, week 4 -- 2.4 mg daily, week 5 and onward 3 mg daily.    [name] lubiprostone (AMITIZA) 24 MCG capsule Take 1 capsule (24 mcg total) by mouth daily with breakfast. 30 capsule 5  . ondansetron (ZOFRAN) 4 MG tablet Take 4 mg by mouth.    . potassium chloride (K-DUR,KLOR-CON) 20 MEQ tablet Take 2 tablets (40 mEq total) by mouth 2 (two) times daily. 20 tablet 0  . Vitamin D, Ergocalciferol, (DRISDOL) 50000 UNITS CAPS capsule Take 50,000 Units by mouth 2 (two) times a week. Takes on sun and wed    . Vortioxetine HBr (BRINTELLIX) 20 MG TABS Take 20 mg by mouth every evening.     . zolpidem (AMBIEN) 10 MG tablet Take 10 mg by mouth at bedtime.     No current facility-administered medications for this visit.      ALLERGIES: Flexeril [cyclobenzaprine hcl]; Latex; and Other  Family History  Problem Relation Age of Onset  . Diabetes Mother   . Arthritis Mother   . Hyperlipidemia Mother   . Other Mother     VARICOSE VEINS; DECREASED HEART RATE; +Hysterectomy for unspecified reason  . Fibromyalgia Mother   . Colon polyps Mother     unspecified number  . Heart disease Father   . Hypertension Father   . Alcohol abuse Father   . Drug abuse Father   . Stroke Father   . Ovarian cancer Sister     dx. <43y  . Cervical cancer Sister 89  . Multiple sclerosis Brother   . Thyroid disease Brother     hypothyroidism  . Lung cancer Maternal Grandmother 74    secondhand smoke  . Depression Maternal Grandmother   . Miscarriages / Stillbirths Maternal Grandmother   . Birth defects Maternal Aunt     EXTRA DIGITS; MUSCULAR DISEASE  . Early death Maternal Aunt 33  . COPD Maternal Grandfather   .  Stroke Maternal Grandfather   . Ovarian cancer Maternal Aunt 56    Dec.age 40 Ovarian CA  . Early death Maternal Aunt     d. three days after  birth; unknown cause  . Breast cancer Maternal Aunt 62  . Colon polyps Maternal Aunt     unspecified number  . Liver disease Paternal Aunt   . Ovarian cancer Maternal Aunt 43  . Breast cancer Other     (x2 maternal great aunts) dx. with breast cancers at unspecified ages  . Breast cancer Other     (x3 maternal 1st cousins, once-removed with breast cancer)  . Colon cancer Other     (x1) maternal first cousin, once-removed died of colon cancer in her 61s  . Heart attack Paternal Uncle     in [place] History  . Marital status: Single    Spouse name: N/A  . Number of children: 5  . Years of education: 38   Occupational History  . CLINICAL RESEARCH    Social History Main Topics  . Smoking status: Never Smoker  . Smokeless tobacco: Never Used  . Alcohol use 1.2 oz/week    2 Glasses of wine per week  . Drug use: No  . Sexual activity: Not Currently    Partners: Male    Birth control/ protection: Abstinence, None   Other Topics Concern  . Not on file   Social History Narrative  . No narrative on file    ROS:  Pertinent items are noted in HPI.  PHYSICAL EXAMINATION:    BP 128/74 (BP Location: Right Arm, Patient Position: Sitting, Cuff Size: Normal)   Pulse 100   Ht 5' 5.5" (1.664 m)   Wt 195 lb 9.6 oz (88.7 kg)   LMP 09/08/2015 (Approximate)   BMI 32.05 kg/m     General appearance: alert, cooperative and appears stated age   Pelvic: External genitalia:  no lesions              Urethra:  normal appearing urethra with no masses, tenderness or lesions              Bartholins and Skenes: normal                 Vagina: normal appearing vagina with normal color and discharge, no lesions              Cervix: no lesions                Bimanual Exam:  Uterus:  normal size, contour, position,  consistency, mobility, non-tender              Adnexa: no mass, fullness, tenderness          Chaperone was present for exam.  ASSESSMENT  Vaginitis.  Secondary amenorrhea.  Gastric polyps. FH breast and ovarian cancer.  Negative genetic testing.  Personal hx of DVT/PE.  Negative work up.   PLAN  Affirm.  Tx plan to follow.  Prolactin, FSH, LH, estradiol, testosterone.  Will likely need course of Provera.  This explained.  Second opinion regarding gastric polyps with Dr. Collene Mares.  Return for annual exam in December.    An After Visit Summary was printed and given to the patient.  __25____ minutes face to face time of which over 50% was spent in counseling.

## 2016-01-24 ENCOUNTER — Telehealth: Payer: Self-pay

## 2016-01-24 LAB — WET PREP BY MOLECULAR PROBE
CANDIDA SPECIES: NEGATIVE
GARDNERELLA VAGINALIS: POSITIVE — AB
Trichomonas vaginosis: NEGATIVE

## 2016-01-24 LAB — ESTRADIOL: Estradiol: 177 pg/mL

## 2016-01-24 LAB — FSH/LH
FSH: 9.1 m[IU]/mL
LH: 7.6 m[IU]/mL

## 2016-01-24 LAB — PROLACTIN: PROLACTIN: 13.4 ng/mL

## 2016-01-24 MED ORDER — MEDROXYPROGESTERONE ACETATE 10 MG PO TABS: 10.0000 mg | ORAL_TABLET | Freq: Every day | ORAL | 0 refills | Status: DC

## 2016-01-24 MED ORDER — METRONIDAZOLE 500 MG PO TABS: 500.0000 mg | ORAL_TABLET | Freq: Two times a day (BID) | ORAL | 0 refills | Status: DC

## 2016-01-24 NOTE — Telephone Encounter (Signed)
Spoke with patient. Advised of results and message as seen below from [place]. Patient is agreeable and verbalizes understanding. Patient would like to start on Flagyl at this time. ETOH precautions given. Patient verbalizes understanding. RX for Flagyl 500 mg po bid x 7 days #14 0RF sent to pharmacy on file. Rx for Provera 10 mg x 10 days #10 0RF sent to pharmacy on file. Aware it may take up to 14 days for her to have any bleeding after completing the Provera. Advised to contact the office with positive or negative bleed. Patient is agreeable.  Routing to provider for final review. Patient agreeable to disposition. Will close encounter.

## 2016-01-24 NOTE — Telephone Encounter (Signed)
-----   Message from [name], MD sent at 01/24/2016 10:13 AM EDT ----- Results to patient through My Chart. Please contact patient about results and need for 2 prescriptions.  She may choose either:  Flagyl 500 mg po bid for 7 days or Metrogel 0.75% pv at hs for 5 nights.  ETOH precautions.  She also needs Provera 10 mg po x 10 days to bring on a menstruation.   Hi Tiffany [name],   The nurse will contact you about test results.  You have bacterial vaginosis in the vagina, and this is treated with an antibiotic.  The hormonal testing looks normal for the prolactin, FSH and LH. You are not menopausal.  The testosterone level is still in process. I am also recommending a course of Provera to bring on your menstrual cycle.   Thank you,   Josefa Half, MD  Cc- [facility]

## 2016-01-26 LAB — TESTOS,TOTAL,FREE AND SHBG (FEMALE)
SEX HORMONE BINDING GLOB.: 109 nmol/L (ref 17–124)
TESTOSTERONE,TOTAL,LC/MS/MS: 38 ng/dL (ref 2–45)
Testosterone, Free: 2.2 pg/mL (ref 0.1–6.4)

## 2016-02-13 ENCOUNTER — Encounter (HOSPITAL_COMMUNITY): Payer: Self-pay | Admitting: Psychiatry

## 2016-02-13 ENCOUNTER — Other Ambulatory Visit (HOSPITAL_COMMUNITY): Payer: 59 | Attending: Psychiatry | Admitting: Psychiatry

## 2016-02-13 DIAGNOSIS — F4321 Adjustment disorder with depressed mood: Secondary | ICD-10-CM | POA: Insufficient documentation

## 2016-02-13 DIAGNOSIS — Z79899 Other long term (current) drug therapy: Secondary | ICD-10-CM | POA: Insufficient documentation

## 2016-02-13 DIAGNOSIS — G43909 Migraine, unspecified, not intractable, without status migrainosus: Secondary | ICD-10-CM | POA: Insufficient documentation

## 2016-02-13 DIAGNOSIS — Z8041 Family history of malignant neoplasm of ovary: Secondary | ICD-10-CM | POA: Insufficient documentation

## 2016-02-13 DIAGNOSIS — Z888 Allergy status to other drugs, medicaments and biological substances status: Secondary | ICD-10-CM | POA: Insufficient documentation

## 2016-02-13 DIAGNOSIS — E669 Obesity, unspecified: Secondary | ICD-10-CM | POA: Insufficient documentation

## 2016-02-13 DIAGNOSIS — Z8261 Family history of arthritis: Secondary | ICD-10-CM | POA: Insufficient documentation

## 2016-02-13 DIAGNOSIS — Z8249 Family history of ischemic heart disease and other diseases of the circulatory system: Secondary | ICD-10-CM | POA: Insufficient documentation

## 2016-02-13 DIAGNOSIS — D573 Sickle-cell trait: Secondary | ICD-10-CM | POA: Insufficient documentation

## 2016-02-13 DIAGNOSIS — K219 Gastro-esophageal reflux disease without esophagitis: Secondary | ICD-10-CM | POA: Insufficient documentation

## 2016-02-13 DIAGNOSIS — K589 Irritable bowel syndrome without diarrhea: Secondary | ICD-10-CM | POA: Insufficient documentation

## 2016-02-13 DIAGNOSIS — Z87440 Personal history of urinary (tract) infections: Secondary | ICD-10-CM | POA: Insufficient documentation

## 2016-02-13 DIAGNOSIS — F331 Major depressive disorder, recurrent, moderate: Secondary | ICD-10-CM | POA: Insufficient documentation

## 2016-02-13 DIAGNOSIS — Z86711 Personal history of pulmonary embolism: Secondary | ICD-10-CM | POA: Insufficient documentation

## 2016-02-13 DIAGNOSIS — M1990 Unspecified osteoarthritis, unspecified site: Secondary | ICD-10-CM | POA: Insufficient documentation

## 2016-02-13 DIAGNOSIS — Z8371 Family history of colonic polyps: Secondary | ICD-10-CM | POA: Insufficient documentation

## 2016-02-13 DIAGNOSIS — Z825 Family history of asthma and other chronic lower respiratory diseases: Secondary | ICD-10-CM | POA: Insufficient documentation

## 2016-02-13 DIAGNOSIS — M797 Fibromyalgia: Secondary | ICD-10-CM | POA: Insufficient documentation

## 2016-02-13 DIAGNOSIS — F419 Anxiety disorder, unspecified: Secondary | ICD-10-CM | POA: Insufficient documentation

## 2016-02-13 DIAGNOSIS — Z833 Family history of diabetes mellitus: Secondary | ICD-10-CM | POA: Insufficient documentation

## 2016-02-13 DIAGNOSIS — Z9889 Other specified postprocedural states: Secondary | ICD-10-CM | POA: Insufficient documentation

## 2016-02-13 DIAGNOSIS — Z803 Family history of malignant neoplasm of breast: Secondary | ICD-10-CM | POA: Insufficient documentation

## 2016-02-13 DIAGNOSIS — I10 Essential (primary) hypertension: Secondary | ICD-10-CM | POA: Insufficient documentation

## 2016-02-13 DIAGNOSIS — Z823 Family history of stroke: Secondary | ICD-10-CM | POA: Insufficient documentation

## 2016-02-13 DIAGNOSIS — Z811 Family history of alcohol abuse and dependence: Secondary | ICD-10-CM | POA: Insufficient documentation
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
## 2016-02-14 ENCOUNTER — Other Ambulatory Visit (HOSPITAL_COMMUNITY): Payer: 59

## 2016-02-15 ENCOUNTER — Other Ambulatory Visit (HOSPITAL_COMMUNITY): Payer: 59 | Admitting: Psychiatry

## 2016-02-15 DIAGNOSIS — F321 Major depressive disorder, single episode, moderate: Secondary | ICD-10-CM

## 2016-02-15 NOTE — Progress Notes (Signed)
Psychiatric Initial Adult Assessment   Patient Identification: Tiffany [name] MRN:  [id number] Date of Evaluation:  02/15/2016 Referral Source: self Chief Complaint: depression  Visit Diagnosis: major depression, recurrent moderate  History of Present Illness:  Ms Tennis Must said her brother with MS died suddenly in December 26, 2022 and that threw her into depression, grief and anxiety.  Both her other siblings have already died and that has made her worry about her own death as her mother and she are the only survivors.  Her father died in Jul 25, 2009.  She also worries her mother might die.  She has 8 children 2 of whom have been adopted. She says she has not been good in choosing men.  Her depression presents with the symptoms outlined below.  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  Her mother lives with her and takes care of the children who are at home and they have a good relationship.   She had a gastric band surgery and lost 100 pounds but has gained 50 pounds recently and does not want to gain more    Associated Signs/Symptoms: Depression Symptoms:  depressed mood, anhedonia, insomnia, fatigue, feelings of worthlessness/guilt, difficulty concentrating, hopelessness, impaired memory, anxiety, weight gain, (Hypo) Manic Symptoms:  none Anxiety Symptoms:  Excessive Worry, Psychotic Symptoms:  none PTSD Symptoms: Negative  Past Psychiatric History: has been in outpatient therapy  Previous Psychotropic Medications: Yes   Substance Abuse History in the last 12 months:  No.  Consequences of Substance Abuse: Negative  Past Medical History:  Past Medical History:  Diagnosis Date  . Abnormal Pap smear 1997   LAST PAP 10/2010  . Allergy    SEASONAL  . Anemia    CHRONIC  . 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 Arthritis   . Blood dyscrasia    SICKLE CELL TRAIT  . Blood transfusion  without reported diagnosis 07-26-2003   after vaginal delivery  . Breast mass in female 2001-07-25  . Bruises easily   . BV (bacterial vaginosis)   . Cyst of ovary 2001/07/25  . Depression Jul 25, 2001  . DUB (dysfunctional uterine bleeding) 2001/07/25  . DVT, lower extremity ([place]) 10/2014   left leg  . Fibroid    LONG TERM DX  . Fibromyalgia Jul 26, 2002  . Fibrositis   . GERD (gastroesophageal reflux disease) 07/25/2005  . H/O amenorrhea July 25, 2004  . H/O menorrhagia 07-25-09  . H/O sickle cell trait   . Heart murmur 2005-07-25  . History of abnormal cervical Pap smear 2001-07-25   had colposcopy and cryo.  Follow up paps are normal.  . Hx: UTI (urinary tract infection) July 25, 2001  . Hypertension 07/26/2003   RESOLVED WITH WT LOSS  . IBD (inflammatory bowel disease)   . IBS (irritable bowel syndrome)   . Infection    OCC YEAST  . Low back pain Jul 25, 2001  . Menses, irregular 07-25-2001  . Migraine   . Obese 07/25/01  . Pulmonary embolism ([place]) 10/2014  . Sacroiliitis ([place])   . Sickle cell anemia ([facility])   . Sickle-cell trait ([place])   . Trichomonas   . Uterine leiomyoma   . Vaginitis and vulvovaginitis July 26, 2002  . Yeast vaginitis 04/24/2010    Past Surgical History:  Procedure Laterality Date  . AUGMENTATION MAMMAPLASTY  [id number]   Silicone implants  . CERVICAL CONE BIOPSY  1997  . COSMETIC SURGERY  2012   thigh lift  . CYSTECTOMY Right    obtain date from patient, was not on history form dated 06/16/10--benign cyst removed Rt.ovary  . DILATION AND CURETTAGE OF UTERUS    . LAPAROSCOPIC GASTRIC BANDING  05/20/2007  . WISDOM TOOTH EXTRACTION      Family Psychiatric History: alcoholism and drug use on father's side and some depression on both sides  Family History:  Family History  Problem Relation Age of Onset  . Diabetes Mother   . Arthritis Mother   . Hyperlipidemia Mother   . Other Mother     VARICOSE VEINS; DECREASED HEART RATE; +Hysterectomy for unspecified reason  . Fibromyalgia Mother   . Colon polyps Mother     unspecified number  . Depression Mother    . Heart disease Father   . Hypertension Father   . Alcohol abuse Father   . Drug abuse Father   . Stroke Father   . Ovarian cancer Sister     dx. <43y  . Cervical cancer Sister 44  . Multiple sclerosis Brother   . Thyroid disease Brother     hypothyroidism  . Lung cancer Maternal Grandmother 74    secondhand smoke  . Depression Maternal Grandmother   . Miscarriages / Stillbirths Maternal Grandmother   . Birth defects Maternal Aunt     EXTRA DIGITS; MUSCULAR DISEASE  . Early death Maternal Aunt 75  . COPD Maternal Grandfather   . Stroke Maternal Grandfather   . Ovarian cancer Maternal Aunt 17    Dec.age 24 Ovarian CA  . Early death Maternal Aunt     d. three days after birth; unknown cause  . Breast cancer Maternal Aunt 13  . Colon polyps Maternal Aunt     unspecified number  . Liver disease Paternal Aunt   . Ovarian cancer Maternal Aunt 53  . Depression Maternal Aunt   . Breast cancer Other     (x2 maternal great aunts) dx. with breast cancers at unspecified ages  . Breast cancer Other     (x3 maternal 1st cousins, once-removed with breast cancer)  . Colon cancer Other     (x1) maternal first cousin, once-removed died of colon cancer in her 96s  . Heart attack Paternal Uncle     in mid-50s  . Alcohol abuse Maternal Uncle     Social History:   Social History   Social History  . Marital status: Single    Spouse name: N/A  . Number of children: 5  . Years of education: 36   Occupational History  . CLINICAL RESEARCH    Social History Main Topics  . Smoking status: Never Smoker  . Smokeless tobacco: Never Used  . Alcohol use 1.2 oz/week    2 Glasses of wine per week  . Drug use: No  . Sexual activity: Not Currently    Partners: Male    Birth control/ protection: Abstinence, None   Other Topics Concern  . Not on file   Social History Narrative  . No narrative on file    Additional Social History: none  Allergies:   Allergies  Allergen Reactions   . Flexeril [Cyclobenzaprine Hcl] Shortness Of Breath, Itching, Swelling and Rash    Swelling of throat  . Latex Shortness Of Breath, Itching and Rash  . Other Other (See Comments)    RUBBER    Metabolic Disorder Labs: No results found for: HGBA1C, MPG Lab Results  Component Value Date   PROLACTIN 13.4 01/23/2016  No results found for: CHOL, TRIG, HDL, CHOLHDL, VLDL, LDLCALC   Current Medications: Current Outpatient Prescriptions  Medication Sig Dispense Refill  . ALPRAZolam (XANAX XR) 1 MG 24 hr tablet Take 1 mg by mouth as needed for anxiety.    . ALPRAZolam (XANAX) 1 MG tablet Take 1 mg by mouth 3 (three) times daily as needed for anxiety.     . cetirizine (ZYRTEC) 10 MG tablet Take 10 mg by mouth.    . fluticasone (FLONASE) 50 MCG/ACT nasal spray 1 spray by Each Nare route Three (3) times a day as needed.    . Liraglutide -Weight Management (SAXENDA) 18 MG/3ML SOPN Week 1 give 0.6 mg daily, week 2 -- 1.2 mg daily, week 3 --1.8 mg daily, week 4 -- 2.4 mg daily, week 5 and onward 3 mg daily.    [name] lubiprostone (AMITIZA) 24 MCG capsule Take 1 capsule (24 mcg total) by mouth daily with breakfast. 30 capsule 5  . medroxyPROGESTERone (PROVERA) 10 MG tablet Take 1 tablet (10 mg total) by mouth daily. 10 tablet 0  . metroNIDAZOLE (FLAGYL) 500 MG tablet Take 1 tablet (500 mg total) by mouth 2 (two) times daily. 14 tablet 0  . ondansetron (ZOFRAN) 4 MG tablet Take 4 mg by mouth.    . potassium chloride (K-DUR,KLOR-CON) 20 MEQ tablet Take 2 tablets (40 mEq total) by mouth 2 (two) times daily. 20 tablet 0  . Vitamin D, Ergocalciferol, (DRISDOL) 50000 UNITS CAPS capsule Take 50,000 Units by mouth 2 (two) times a week. Takes on sun and wed    . Vortioxetine HBr (BRINTELLIX) 20 MG TABS Take 20 mg by mouth every evening.     . zolpidem (AMBIEN) 10 MG tablet Take 10 mg by mouth at bedtime.     No current facility-administered medications for this visit.     Neurologic: Headache:  Negative Seizure: Negative Paresthesias:Negative  Musculoskeletal: Strength & Muscle Tone: within normal limits Gait & Station: normal Patient leans: N/A  Psychiatric Specialty Exam: ROS  Last menstrual period 09/08/2015.There is no height or weight on file to calculate BMI.  General Appearance: Well Groomed  Eye Contact:  Good  Speech:  Clear and Coherent  Volume:  Normal  Mood:  Depressed  Affect:  Congruent  Thought Process:  Coherent  Orientation:  Full (Time, Place, and Person)  Thought Content:  Logical  Suicidal Thoughts:  No  Homicidal Thoughts:  No  Memory:  Immediate;   Good Recent;   Good Remote;   Good  Judgement:  Good  Insight:  Good  Psychomotor Activity:  Normal  Concentration:  Concentration: Good and Attention Span: Good  Recall:  Good  Fund of Knowledge:Good  Language: Good  Akathisia:  Negative  Handed:  Right  AIMS (if indicated):  0  Assets:  Communication Skills Desire for Improvement Financial Resources/Insurance Housing Leisure Time [place] Talents/Skills Transportation Vocational/Educational  ADL's:  Intact  Cognition: WNL  Sleep:  poor    Treatment Plan Summary: Admit to IOP with daily group therapy   Donnelly Angelica, MD 11/8/20172:00 PM

## 2016-02-16 ENCOUNTER — Other Ambulatory Visit (HOSPITAL_COMMUNITY): Payer: 59 | Admitting: Psychiatry

## 2016-02-16 DIAGNOSIS — F4321 Adjustment disorder with depressed mood: Secondary | ICD-10-CM | POA: Diagnosis not present

## 2016-02-16 DIAGNOSIS — Z9889 Other specified postprocedural states: Secondary | ICD-10-CM | POA: Diagnosis not present

## 2016-02-16 DIAGNOSIS — F321 Major depressive disorder, single episode, moderate: Secondary | ICD-10-CM

## 2016-02-16 DIAGNOSIS — Z8261 Family history of arthritis: Secondary | ICD-10-CM | POA: Diagnosis not present

## 2016-02-16 DIAGNOSIS — K219 Gastro-esophageal reflux disease without esophagitis: Secondary | ICD-10-CM | POA: Diagnosis not present

## 2016-02-16 DIAGNOSIS — Z87440 Personal history of urinary (tract) infections: Secondary | ICD-10-CM | POA: Diagnosis not present

## 2016-02-16 DIAGNOSIS — G43909 Migraine, unspecified, not intractable, without status migrainosus: Secondary | ICD-10-CM | POA: Diagnosis not present

## 2016-02-16 DIAGNOSIS — D573 Sickle-cell trait: Secondary | ICD-10-CM | POA: Diagnosis not present

## 2016-02-16 DIAGNOSIS — I10 Essential (primary) hypertension: Secondary | ICD-10-CM | POA: Diagnosis not present

## 2016-02-16 DIAGNOSIS — Z823 Family history of stroke: Secondary | ICD-10-CM | POA: Diagnosis not present

## 2016-02-16 DIAGNOSIS — F419 Anxiety disorder, unspecified: Secondary | ICD-10-CM | POA: Diagnosis not present

## 2016-02-16 DIAGNOSIS — E669 Obesity, unspecified: Secondary | ICD-10-CM | POA: Diagnosis not present

## 2016-02-16 DIAGNOSIS — Z86711 Personal history of pulmonary embolism: Secondary | ICD-10-CM | POA: Diagnosis not present

## 2016-02-16 DIAGNOSIS — F331 Major depressive disorder, recurrent, moderate: Secondary | ICD-10-CM | POA: Diagnosis not present

## 2016-02-16 DIAGNOSIS — Z888 Allergy status to other drugs, medicaments and biological substances status: Secondary | ICD-10-CM | POA: Diagnosis not present

## 2016-02-16 DIAGNOSIS — Z811 Family history of alcohol abuse and dependence: Secondary | ICD-10-CM | POA: Diagnosis not present

## 2016-02-16 DIAGNOSIS — Z833 Family history of diabetes mellitus: Secondary | ICD-10-CM | POA: Diagnosis not present

## 2016-02-16 DIAGNOSIS — Z8041 Family history of malignant neoplasm of ovary: Secondary | ICD-10-CM | POA: Diagnosis not present

## 2016-02-16 DIAGNOSIS — Z803 Family history of malignant neoplasm of breast: Secondary | ICD-10-CM | POA: Diagnosis not present

## 2016-02-16 DIAGNOSIS — Z825 Family history of asthma and other chronic lower respiratory diseases: Secondary | ICD-10-CM | POA: Diagnosis not present

## 2016-02-16 DIAGNOSIS — M797 Fibromyalgia: Secondary | ICD-10-CM | POA: Diagnosis not present

## 2016-02-16 DIAGNOSIS — K589 Irritable bowel syndrome without diarrhea: Secondary | ICD-10-CM | POA: Diagnosis not present

## 2016-02-16 DIAGNOSIS — Z8249 Family history of ischemic heart disease and other diseases of the circulatory system: Secondary | ICD-10-CM | POA: Diagnosis not present

## 2016-02-16 DIAGNOSIS — Z8371 Family history of colonic polyps: Secondary | ICD-10-CM | POA: Diagnosis not present

## 2016-02-16 DIAGNOSIS — M1990 Unspecified osteoarthritis, unspecified site: Secondary | ICD-10-CM | POA: Diagnosis not present

## 2016-02-17 ENCOUNTER — Other Ambulatory Visit (HOSPITAL_COMMUNITY): Payer: 59 | Admitting: Psychiatry

## 2016-02-17 ENCOUNTER — Telehealth (HOSPITAL_COMMUNITY): Payer: Self-pay | Admitting: Psychiatry

## 2016-02-20 ENCOUNTER — Other Ambulatory Visit (HOSPITAL_COMMUNITY): Payer: 59 | Admitting: Psychiatry

## 2016-02-20 DIAGNOSIS — F331 Major depressive disorder, recurrent, moderate: Secondary | ICD-10-CM | POA: Diagnosis not present

## 2016-02-20 DIAGNOSIS — F321 Major depressive disorder, single episode, moderate: Secondary | ICD-10-CM

## 2016-02-20 NOTE — Progress Notes (Signed)
    Daily Group Progress Note  Program: IOP  Group Time: 9:00-12:00   Participation Level: active   Behavioral Response:  responsive   Type of Therapy:   group therapy   Summary of Progress:  Pt asked counselor for a more in depth explanation of mindfulness and how she can utilize this practice in her life. 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   Nancie Neas, LPC

## 2016-02-20 NOTE — Progress Notes (Signed)
    Daily Group Progress Note  Program: IOP  Daily Group Progress Note   Program: IOP   Group Time: 9:00-12:00 (9-10 [facility] provided educational information)   Participation Level:  active   Behavioral Response:  responsive   Type of Therapy:   group therapy Summary of Progress:  Patient reports feeling pleasant today. She was reflecting today on handling stress. She shared what she has learned thus far (breathing exercises, positive self talk, relaxing techniques, etc.). Patient also mentioned that she has difficulty with mood stabilization. She appeared anxious but willing to participate in group discussion with encouragement. Patient still struggling with balancing in her life. She is a mother of 8 and shared that she is learning to provide balance in her household to promote a healthy environment for herself and children.  Patient was open to the group providing insight about different types of balance (spiritual, emotional, physical, etc.). Patient resonated on the groups suggestions to assist in her with building balance. She plans to provide better emotional support with her 17 yr old son especially. She explains that she often has difficulty with telling him "No" but plans to be more direct with him.  Overall, she looks forward to providing better structural balances with all her children.   Nancie Neas, LPC

## 2016-02-21 ENCOUNTER — Other Ambulatory Visit (HOSPITAL_COMMUNITY): Payer: 59

## 2016-02-22 ENCOUNTER — Other Ambulatory Visit (HOSPITAL_COMMUNITY): Payer: 59

## 2016-02-23 ENCOUNTER — Other Ambulatory Visit (HOSPITAL_COMMUNITY): Payer: 59

## 2016-02-23 NOTE — Progress Notes (Signed)
    Daily Group Progress Note  Program: IOP  Group Time: 9:00-12:00   Participation Level: minimal    Behavioral Response:  flat   Type of Therapy:  group therapy   Summary of Progress: Pt did not share in group. However, privately she told the counselor that her god son had unexpectedly passed away the other night, and she was struggling to grieve. Counselor gave client some grief processing strategies, and pt expressed appreciation.  Nancie Neas, LPC

## 2016-02-24 ENCOUNTER — Encounter: Payer: Self-pay | Admitting: Obstetrics and Gynecology

## 2016-02-24 ENCOUNTER — Telehealth: Payer: Self-pay

## 2016-02-24 ENCOUNTER — Other Ambulatory Visit (HOSPITAL_COMMUNITY): Payer: 59

## 2016-02-24 ENCOUNTER — Telehealth: Payer: Self-pay | Admitting: *Deleted

## 2016-02-24 MED ORDER — FLUCONAZOLE 150 MG PO TABS: 150.0000 mg | ORAL_TABLET | Freq: Once | ORAL | 0 refills | Status: AC

## 2016-02-24 NOTE — Telephone Encounter (Signed)
Opened in error, will close encounter

## 2016-02-24 NOTE — Telephone Encounter (Signed)
Spoke with patient. Advised of message as seen below from [place]. Rx for Diflucan 150 mg po x 1 repeat in 72 hours if symptoms persist #2 0RF sent to pharmacy on file.  Routing to provider for final review. Patient agreeable to disposition. Will close encounter.

## 2016-02-24 NOTE — Telephone Encounter (Signed)
Telephone encounter created to review with Dr.Miller. 

## 2016-02-24 NOTE — Telephone Encounter (Signed)
Spoke with patient. Positive BV test on 01/23/2016. Patient states that she finished her Flagyl prescription two weeks ago. States a few days after she began to have vaginal itching. Reports vaginal itching has increased and is now having white vaginal discharge without odor. Symptoms are worsening. Requesting an rx for Diflucan be sent to her pharmacy on file. Advised I will speak with covering provider Dr.Miller and return call with further recommendations. Patient is agreeable.  Non-Urgent Medical Question  Message [id number]  From ALAYSA DIGILIO To [name], MD Sent 02/24/2016 1:46 PM  Hi Dr. Quincy Simmonds,   I recently finished up with a round of flagyl (metronidazole) for bacterial vaginosis. I have a yeast infection from taking the medication and would like to know if I could get a prescription for Diflucan called in to my pharmacy at [facility] on [address], [place], [place].   Responsible Party   Pool - [facility] Pool No one has taken responsibility for this message.  No actions have been taken on this message.   Dr.Miller, okay to send in rx for Diflucan 150 mg take 1 tablet po now, repeat in 72 hours if symptoms persist #2 0RF?

## 2016-02-24 NOTE — Telephone Encounter (Signed)
OK to treat with diflucan 150mg  po x 1, repeat 72 hours.  #2/0RF.

## 2016-02-27 ENCOUNTER — Other Ambulatory Visit (HOSPITAL_COMMUNITY): Payer: 59

## 2016-02-28 ENCOUNTER — Other Ambulatory Visit (HOSPITAL_COMMUNITY): Payer: 59 | Admitting: Psychiatry

## 2016-02-28 DIAGNOSIS — F331 Major depressive disorder, recurrent, moderate: Secondary | ICD-10-CM | POA: Diagnosis not present

## 2016-02-28 DIAGNOSIS — F321 Major depressive disorder, single episode, moderate: Secondary | ICD-10-CM

## 2016-02-29 ENCOUNTER — Other Ambulatory Visit (HOSPITAL_COMMUNITY): Payer: 59

## 2016-03-02 ENCOUNTER — Other Ambulatory Visit (HOSPITAL_COMMUNITY): Payer: 59

## 2016-03-05 ENCOUNTER — Other Ambulatory Visit (HOSPITAL_COMMUNITY): Payer: 59 | Admitting: Psychiatry

## 2016-03-05 DIAGNOSIS — F331 Major depressive disorder, recurrent, moderate: Secondary | ICD-10-CM | POA: Diagnosis not present

## 2016-03-05 DIAGNOSIS — F321 Major depressive disorder, single episode, moderate: Secondary | ICD-10-CM

## 2016-03-05 NOTE — Progress Notes (Signed)
    Daily Group Progress Note  Program: IOP  Group Time: 9:00-12:00   Participation Level:  minimal  Behavioral Response:  responsive   Type of Therapy:  group therapy   Summary of Progress:  Pt discussed the recent death of her God son, and how this death has reminder her of the multitude of recent family deaths she has experienced. Pt is struggling to understand how to cope with so much grief, and she has begun to fear that perhaps she too is going to die soon. Counselor encouraged pt to stay present with her pain, and to make space for experiencing it.   Nancie Neas, LPC

## 2016-03-06 ENCOUNTER — Other Ambulatory Visit (HOSPITAL_COMMUNITY): Payer: 59 | Admitting: Psychiatry

## 2016-03-06 DIAGNOSIS — F331 Major depressive disorder, recurrent, moderate: Secondary | ICD-10-CM | POA: Diagnosis not present

## 2016-03-06 DIAGNOSIS — F321 Major depressive disorder, single episode, moderate: Secondary | ICD-10-CM

## 2016-03-07 NOTE — Progress Notes (Signed)
    Daily Group Progress Note  Program: IOP  Group Time: 9:00-12:00   Participation Level:  minimal   Behavioral Response:  responsive   Type of Therapy:  group therapy   Summary of Progress:  Pt reported having a good holiday with her family. She reflected that the many recent deaths in the family had in some ways brought them all closer. She continues to struggle with her grief, however the counselor referred her to hospice for counseling and she expressed an interest in participating in their grief counseling and groups. Pt also expressed uncertainty about going back to work and what that is going to look like for her. She enjoys her job, but doesn't quite feel ready to return yet.   Nancie Neas, LPC

## 2016-03-08 NOTE — Progress Notes (Signed)
    Daily Group Progress Note  Program: IOP  Group Time: 9:00-12:00  Participation Level: Active  Behavioral Response: Appropriate  Type of Therapy:  Group Therapy  Summary of Progress: Pt. Reported that she was doing "good". Pt. Smiled appropriately and cried when discussing the loss of her cousin. Pt. Discussed her gratitude for having children who are healthy and compassionate. Pt. Continues to grieve the loss of cousin who was raised as her brother who died in 2022-12-27 of this year. Pt. Discussed feelings of guilt because she was not present when he died and her fear that he did not know that he was loved by her and other family members.       Nancie Neas, LPC

## 2016-03-09 ENCOUNTER — Telehealth (HOSPITAL_COMMUNITY): Payer: Self-pay | Admitting: Psychiatry

## 2016-03-12 NOTE — Progress Notes (Signed)
Tiffany [name] is a 40 y.o. , single, employed, [place] American female; who was referred per therapist [name], [place]); treatment for ongoing depressive symptoms.  Multiple stressors:  1)  Unresolved grief/loss issues:  According to pt the losses started when her father died in 2011, Sept 2011 her maternal grandfather unexpectedly died also.  Then she had a cousin who she grew up with died in Aug 07, 2013; sister on 2013-08-11 unexpectedly died then brother who has been struggling with MS died on 2015-11-15.  He was cleared of an infection in July 2017 and was currently at a nursing home awaiting to return home.  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  "I worry about my mom because I know she is grieving for her kids.  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  Pt states it is very stressful with a lot of office politics.  3)  Single parenting:  Pt has eight kids.  The youngest kids father will be released from prison soon and pt is worried about him coming back into their lives.  "The kids love their father, but I have a lot of trust issues with him and I don't feel the same way towards him."  Pt admits that she hasn't made good decisions over the yrs when it comes to relationships.  4)  2009 Lapband surgery.  Started out weighing 252, but lost 100 lbs post surgery.  States d/t all stress has gained 50 lbs.  Plans to see a gastro doctor tomorrow.  Denies any prior psych admits or suicide attempts/gestures.  Has been seeing Dr. Toy Care and Pamala Hurry for eight yrs.  Has been out on short term disability since Jan 07, 2016.  Family Hx:  Deceased father:  PTSD, drugs, ETOH, Paternal Uncles:  ETOH; Maternal 87, Mother, deceased siblings and maternal grandmother:  Depression.     Pt attended seven days out of the fifteen that were authorized.  Pt states that the program isn't a good fit for her any longer.  "I want to f/u with  [facility]."  Denies SI/HI or A/V hallucinations.  A:  D/C today.  F/U with Dr. Toy Care and Jason Coop.  Encouraged support groups.  R:  Pt receptive.                                           Carlis Abbott, RITA, M.Ed, CNA

## 2016-03-13 NOTE — Progress Notes (Signed)
[facility] DISCHARGE NOTE  Patient:  Tiffany [name] DOB:  04-16-1975  Date of Admission: 02/15/2016  Date of Discharge: 03/13/2016  Reason for Admission:depression  IOP Course:attended 7 days and did not return.  Phone call revealed her decision to attend hospice which was a recommendation of ours.  She believed that would be more useful to her  Mental Status at Discharge:no suicidal thoughts while in the program  Diagnosis: major depression, recurrent moderate and grief reaction  Level of Care:  IOP  Discharge destination:  Appointments have been made with Dr Toy Care and Ms Lynda Rainwater.  She will also follow up with Hospice    Comments:  none  The patient received suicide prevention pamphlet:  Yes   Donnelly Angelica, MD

## 2016-03-16 ENCOUNTER — Emergency Department (HOSPITAL_COMMUNITY): Payer: Managed Care, Other (non HMO)

## 2016-03-16 ENCOUNTER — Encounter (HOSPITAL_COMMUNITY): Payer: Self-pay | Admitting: Emergency Medicine

## 2016-03-16 ENCOUNTER — Emergency Department (HOSPITAL_COMMUNITY)
Admission: EM | Admit: 2016-03-16 | Discharge: 2016-03-16 | Disposition: A | Discharge status: Home / Self Care | Payer: Managed Care, Other (non HMO) | Attending: Emergency Medicine | Admitting: Emergency Medicine

## 2016-03-16 DIAGNOSIS — Z79899 Other long term (current) drug therapy: Secondary | ICD-10-CM | POA: Insufficient documentation

## 2016-03-16 DIAGNOSIS — Y999 Unspecified external cause status: Secondary | ICD-10-CM | POA: Diagnosis not present

## 2016-03-16 DIAGNOSIS — S82831A Other fracture of upper and lower end of right fibula, initial encounter for closed fracture: Secondary | ICD-10-CM | POA: Insufficient documentation

## 2016-03-16 DIAGNOSIS — Y939 Activity, unspecified: Secondary | ICD-10-CM | POA: Diagnosis not present

## 2016-03-16 DIAGNOSIS — S92909A Unspecified fracture of unspecified foot, initial encounter for closed fracture: Secondary | ICD-10-CM

## 2016-03-16 DIAGNOSIS — S99911A Unspecified injury of right ankle, initial encounter: Secondary | ICD-10-CM | POA: Diagnosis present

## 2016-03-16 DIAGNOSIS — W1839XA Other fall on same level, initial encounter: Secondary | ICD-10-CM | POA: Insufficient documentation

## 2016-03-16 DIAGNOSIS — Y929 Unspecified place or not applicable: Secondary | ICD-10-CM | POA: Diagnosis not present

## 2016-03-16 DIAGNOSIS — I10 Essential (primary) hypertension: Secondary | ICD-10-CM | POA: Diagnosis not present

## 2016-03-16 HISTORY — DX: Unspecified fracture of unspecified foot, initial encounter for closed fracture: S92.909A

## 2016-03-16 MED ORDER — OXYCODONE-ACETAMINOPHEN 5-325 MG PO TABS
1.0000 | ORAL_TABLET | Freq: Once | ORAL | Status: AC
  Administered 2016-03-16: 1 via ORAL
  Filled 2016-03-16: qty 1

## 2016-03-16 MED ORDER — OXYCODONE-ACETAMINOPHEN 5-325 MG PO TABS: 1.0000 | ORAL_TABLET | ORAL | 0 refills | Status: DC | PRN

## 2016-03-16 NOTE — Progress Notes (Signed)
Orthopedic Tech Progress Note Patient Details:  Tiffany [name] 1975/04/19 [id number]  Ortho Devices Type of Ortho Device: Ace wrap, Crutches, Short leg splint, Stirrup splint Ortho Device/Splint Interventions: Application   [place] Pink 03/16/2016, 12:07 PM

## 2016-03-16 NOTE — ED Notes (Signed)
Ortho paged. 

## 2016-03-16 NOTE — Discharge Instructions (Signed)
Please read attached information. If you experience any new or worsening signs or symptoms please return to the emergency room for evaluation. Please follow-up with your primary care provider or specialist as discussed. Please use medication prescribed only as directed and discontinue taking if you have any concerning signs or symptoms.   °

## 2016-03-16 NOTE — ED Notes (Signed)
Pt A&OX4, states her mother will be driving her home.

## 2016-03-16 NOTE — ED Triage Notes (Signed)
Got up to void thid am got dizzy and fell and  Hurt her left ankle thinks it is fx, swollen ankle, good pulse and can wiggle toes , has good blanche < 3 sec

## 2016-03-16 NOTE — ED Provider Notes (Signed)
[facility] DEPT Provider Note   CSN: [id number] Arrival date & time: 03/16/16  1001   By signing my name below, I, Delton Prairie, attest that this documentation has been prepared under the direction and in the presence of  [employer], PA-C. Electronically Signed: Delton Prairie, ED Scribe. 03/16/16. 11:12 AM.   History   Chief Complaint Chief Complaint  Patient presents with  . Ankle Injury    The history is provided by the patient. No language interpreter was used.   HPI Comments:  Tiffany [name] is a 40 y.o. female who presents to the Emergency Department complaining of sudden onset, moderate right ankle pain s/p a fall which occurred in the AM today. Patient reports that she took a muscle relaxer last night, when she woke up very early in the morning she felt slightly dizzy and fell to the floor. She reports she is not feeling dizzy presently, reports this was from medication, she denies any preceding chest pain shortness of breath or any other concerning signs or symptoms. Pt notes associated ankle swelling. No alleviating factors noted. Pt denies any other associated symptoms and modifying factors at this time.    Past Medical History:  Diagnosis Date  . Abnormal Pap smear 1997   LAST PAP 10/2010  . Allergy    SEASONAL  . Anemia    CHRONIC  . 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 Arthritis   . Blood dyscrasia    SICKLE CELL TRAIT  . Blood transfusion without reported diagnosis 2005   after vaginal delivery  . Breast mass in female 2003  . Bruises easily   . BV (bacterial vaginosis)   . Cyst of ovary 2003  . Depression 2003  . DUB (dysfunctional uterine bleeding) 2003  . DVT, lower extremity ([place]) 10/2014   left leg  . Fibroid    LONG TERM DX  . Fibromyalgia 2004  . Fibrositis   . GERD (gastroesophageal reflux disease) 2007  . H/O amenorrhea 2006  . H/O menorrhagia 2011  . H/O sickle cell trait   . Heart murmur 2007  . History of abnormal cervical Pap smear  2003   had colposcopy and cryo.  Follow up paps are normal.  . Hx: UTI (urinary tract infection) 2003  . Hypertension 2005   RESOLVED WITH WT LOSS  . IBD (inflammatory bowel disease)   . IBS (irritable bowel syndrome)   . Infection    OCC YEAST  . Low back pain 2003  . Menses, irregular 2003  . Migraine   . Obese 2003  . Pulmonary embolism ([place]) 10/2014  . Sacroiliitis ([place])   . Sickle cell anemia ([facility])   . Sickle-cell trait ([place])   . Trichomonas   . Uterine leiomyoma   . Vaginitis and vulvovaginitis 2004  . Yeast vaginitis 04/24/2010    Patient Active Problem List   Diagnosis Date Noted  . Genetic testing 06/13/2015  . Family history of breast cancer in female 06/02/2015  . Family history of ovarian cancer 06/02/2015  . BV (bacterial vaginosis) 02/18/2015  . Hemorrhoid 02/18/2015  . Muscle pain 11/30/2014  . Left leg DVT ([place]) 11/18/2014  . Hx of laparoscopic gastric banding 01/29/2012  . Depression 12/19/2011  . Fibromyalgia 12/19/2011  . Arthritis 12/19/2011  . IBS (irritable bowel syndrome) 12/19/2011  . GERD (gastroesophageal reflux disease) 12/19/2011  . Fibroids 12/19/2011    Past Surgical History:  Procedure Laterality Date  . AUGMENTATION MAMMAPLASTY  2011  Silicone implants  . CERVICAL CONE BIOPSY  1997  . COSMETIC SURGERY  2012   thigh lift  . CYSTECTOMY Right    obtain date from patient, was not on history form dated 06/16/10--benign cyst removed Rt.ovary  . DILATION AND CURETTAGE OF UTERUS    . LAPAROSCOPIC GASTRIC BANDING  05/20/2007  . WISDOM TOOTH EXTRACTION      OB History    Gravida Para Term Preterm AB Living   12 6 6   6 6    SAB TAB Ectopic Multiple Live Births   6       6       Home Medications    Prior to Admission medications   Medication Sig Start Date End Date Taking? Authorizing Provider  ALPRAZolam (XANAX XR) 1 MG 24 hr tablet Take 1 mg by mouth as needed for anxiety.    Historical Provider, MD  ALPRAZolam Duanne Moron) 1 MG tablet  Take 1 mg by mouth 3 (three) times daily as needed for anxiety.     Historical Provider, MD  cetirizine (ZYRTEC) 10 MG tablet Take 10 mg by mouth. 09/07/15   Historical Provider, MD  fluticasone (FLONASE) 50 MCG/ACT nasal spray 1 spray by Each Nare route Three (3) times a day as needed. 10/12/15   Historical Provider, MD  Liraglutide -Weight Management (SAXENDA) 18 MG/3ML SOPN Week 1 give 0.6 mg daily, week 2 -- 1.2 mg daily, week 3 --1.8 mg daily, week 4 -- 2.4 mg daily, week 5 and onward 3 mg daily. 01/13/16   Historical Provider, MD  lubiprostone (AMITIZA) 24 MCG capsule Take 1 capsule (24 mcg total) by mouth daily with breakfast. 12/09/14   Boykin Nearing, MD  medroxyPROGESTERone (PROVERA) 10 MG tablet Take 1 tablet (10 mg total) by mouth daily. 01/24/16   [place]wood, MD  metroNIDAZOLE (FLAGYL) 500 MG tablet Take 1 tablet (500 mg total) by mouth 2 (two) times daily. 01/24/16   Brook Oletta Lamas, MD  ondansetron [facility]) 4 MG tablet Take 4 mg by mouth. 09/07/15 09/06/16  Historical Provider, MD  oxyCODONE-acetaminophen (PERCOCET/ROXICET) 5-325 MG tablet Take 1 tablet by mouth every 4 (four) hours as needed for severe pain. 03/16/16   Okey Regal, PA-C  potassium chloride (K-DUR,KLOR-CON) 20 MEQ tablet Take 2 tablets (40 mEq total) by mouth 2 (two) times daily. 09/08/15   Nelida Meuse III, MD  Vitamin D, Ergocalciferol, (DRISDOL) 50000 UNITS CAPS capsule Take 50,000 Units by mouth 2 (two) times a week. Takes on sun and wed    Historical Provider, MD  Vortioxetine HBr (BRINTELLIX) 20 MG TABS Take 20 mg by mouth every evening.     Historical Provider, MD  zolpidem (AMBIEN) 10 MG tablet Take 10 mg by mouth at bedtime.    Historical Provider, MD    Family History Family History  Problem Relation Age of Onset  . Diabetes Mother   . Arthritis Mother   . Hyperlipidemia Mother   . Other Mother     VARICOSE VEINS; DECREASED HEART RATE; +Hysterectomy for unspecified reason  .  Fibromyalgia Mother   . Colon polyps Mother     unspecified number  . Depression Mother   . Heart disease Father   . Hypertension Father   . Alcohol abuse Father   . Drug abuse Father   . Stroke Father   . Ovarian cancer Sister     dx. <43y  . Cervical cancer Sister 63  . Multiple sclerosis Brother   .  Thyroid disease Brother     hypothyroidism  . Lung cancer Maternal Grandmother 74    secondhand smoke  . Depression Maternal Grandmother   . Miscarriages / Stillbirths Maternal Grandmother   . Birth defects Maternal Aunt     EXTRA DIGITS; MUSCULAR DISEASE  . Early death Maternal Aunt 45  . COPD Maternal Grandfather   . Stroke Maternal Grandfather   . Ovarian cancer Maternal Aunt 10    Dec.age 81 Ovarian CA  . Early death Maternal Aunt     d. three days after birth; unknown cause  . Breast cancer Maternal Aunt 13  . Colon polyps Maternal Aunt     unspecified number  . Liver disease Paternal Aunt   . Ovarian cancer Maternal Aunt 47  . Depression Maternal Aunt   . Breast cancer Other     (x2 maternal great aunts) dx. with breast cancers at unspecified ages  . Breast cancer Other     (x3 maternal 1st cousins, once-removed with breast cancer)  . Colon cancer Other     (x1) maternal first cousin, once-removed died of colon cancer in her 61s  . Heart attack Paternal Uncle     in mid-50s  . Alcohol abuse Maternal Uncle     Social History Social History  Substance Use Topics  . Smoking status: Never Smoker  . Smokeless tobacco: Never Used  . Alcohol use 1.2 oz/week    2 Glasses of wine per week     Allergies   Flexeril [cyclobenzaprine hcl]; Latex; and Other   Review of Systems Review of Systems  Musculoskeletal: Positive for arthralgias and joint swelling.  Neurological: Negative for numbness.  All other systems reviewed and are negative.  Physical Exam Updated Vital Signs BP 117/76 (BP Location: Right Arm)   Pulse 79   Temp 98.4 F (36.9 C) (Oral)   Resp  16   LMP 03/09/2016   SpO2 99%   Physical Exam  Constitutional: She is oriented to person, place, and time. She appears well-developed and well-nourished. No distress.  HENT:  Head: Normocephalic and atraumatic.  Eyes: Conjunctivae are normal.  Cardiovascular: Normal rate.   Pulmonary/Chest: Effort normal.  Abdominal: She exhibits no distension.  Musculoskeletal: She exhibits edema and tenderness.  Obvious swelling to right lateral ankle and proximal foot. Pedal pulses 2+, remainder of lower extremity normal, sensation intact, no fracture to femur.  Neurological: She is alert and oriented to person, place, and time.  Skin: Skin is warm and dry.  Psychiatric: She has a normal mood and affect.  Nursing note and vitals reviewed.  ED Treatments / Results  DIAGNOSTIC STUDIES:  Oxygen Saturation is 100% on RA, normal by my interpretation.    COORDINATION OF CARE:  11:09 AM Discussed treatment plan with pt at bedside and pt agreed to plan.  Labs (all labs ordered are listed, but only abnormal results are displayed) Labs Reviewed - No data to display  EKG  EKG Interpretation None       Radiology Dg Ankle Complete Right  Result Date: 03/16/2016 CLINICAL DATA:  Recent fall with known second metatarsal fracture and ankle pain EXAM: RIGHT ANKLE - COMPLETE 3+ VIEW COMPARISON:  None. FINDINGS: There is an oblique fracture through the distal fibula without significant displacement. Significant soft tissue swelling is noted. The known second metatarsal fracture is not well appreciated. No other focal abnormality is seen. IMPRESSION: Distal fibular fracture Electronically Signed   By: Inez Catalina M.D.   On: 03/16/2016 10:50  Dg Foot Complete Right  Result Date: 03/16/2016 CLINICAL DATA:  Recent fall with foot pain, initial encounter EXAM: RIGHT FOOT COMPLETE - 3+ VIEW COMPARISON:  None. FINDINGS: There is a mildly displaced fracture through the base of the second metatarsal. Oblique  fracture through the distal fibula is noted as well. No dislocation is noted. No soft tissue changes are seen. IMPRESSION: Mildly displaced fracture at the base of the second metatarsal. Oblique fracture through the distal fibula. Electronically Signed   By: Inez Catalina M.D.   On: 03/16/2016 10:49    Procedures Procedures (including critical care time)  Medications Ordered in ED Medications  oxyCODONE-acetaminophen (PERCOCET/ROXICET) 5-325 MG per tablet 1 tablet (1 tablet Oral Given 03/16/16 1131)     Initial Impression / Assessment and Plan / ED Course  I have reviewed the triage vital signs and the nursing notes.  Pertinent labs & imaging results that were available during my care of the patient were reviewed by me and considered in my medical decision making (see chart for details).  Clinical Course     Labs:   Imaging:   Consults:   Therapeutics:   Discharge Meds:  Assessment/Plan:   40 year old female presents today with a fracture to her distal fibula and proximal metacarpal. Joint spaces appear to be within normal limits, she will need close follow-up with orthopedics. Patient has an orthopedic surgeon she would like to follow-up with, I also gave her on-call physician information and instructed her to follow-up with either provider within one week. She is given a short course of pain medication, instructed to keep elevated, ice, avoid any weightbearing activity. She is given strict return cautioned, she verbalized understanding and agreement to today's plan had no further questions or concerns at time of discharge  Final Clinical Impressions(s) / ED Diagnoses   Final diagnoses:  Closed fracture of distal end of right fibula, unspecified fracture morphology, initial encounter    New Prescriptions Discharge Medication List as of 03/16/2016 11:53 AM    START taking these medications   Details  oxyCODONE-acetaminophen (PERCOCET/ROXICET) 5-325 MG tablet Take 1 tablet by  mouth every 4 (four) hours as needed for severe pain., Starting Fri 03/16/2016, Print      I personally performed the services described in this documentation, which was scribed in my presence. The recorded information has been reviewed and is accurate.    Okey Regal, PA-C 03/16/16 [place], MD 03/16/16 [phone]

## 2016-03-21 ENCOUNTER — Other Ambulatory Visit: Payer: Self-pay | Admitting: Gastroenterology

## 2016-03-21 DIAGNOSIS — R198 Other specified symptoms and signs involving the digestive system and abdomen: Secondary | ICD-10-CM

## 2016-03-21 NOTE — Progress Notes (Signed)
40 y.o. [id number] Single African American female here for annual exam.    Menses are regular now.   Vulvar itching.  Treated for BV in October and then took Diflucan following this.  Using sanitary protection often.   Urinary incontinence with cough and sneeze.  Broke her foot and will having upcoming surgery. In a boot.   Has had a lot of blood work done through [employer].  Had normal FHS, E2, prolactin, and TSH through me to investigae secondary amenorrhea.   PCP:   Bernerd Limbo, MD - [facility] regional  Patient's last menstrual period was 03/09/2016 (approximate).     Period Cycle (Days): 30 Period Duration (Days): 3-5 Period Pattern: Regular Menstrual Flow: Moderate Menstrual Control: Tampon Dysmenorrhea: (!) Mild Dysmenorrhea Symptoms: Cramping, Diarrhea     Sexually active: No. FEMALE The current method of family planning is abstinence.    Exercising: No.   Smoker:  no  Health Maintenance: Pap:  02-18-15 Neg:Neg HR HPV History of abnormal Pap:  Yes, Hx colpo/cryotherapy to cervix 2003;paps normal since. MMG:  06-10-15 3D/Density B/Neg/BiRads1:TBC Colonoscopy:  03-21-16 polyps with Dr.Mann.  Waiting for bx result.  Also had gastric polyps.  BMD:   n/a  Result  n/a TDaP:  10 years ago Gardasil:   N/A Hep C:  NA Screening Labs:  Hb today: PCP, Urine today: unable to void   reports that she has never smoked. She has never used smokeless tobacco. She reports that she drinks about 0.6 oz of alcohol per week . She reports that she does not use drugs.  Past Medical History:  Diagnosis Date  . Abnormal Pap smear 1997   LAST PAP 10/2010  . Allergy    SEASONAL  . Anemia    CHRONIC  . 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 Arthritis   . Blood dyscrasia    SICKLE CELL TRAIT  . Blood transfusion without reported diagnosis 2005   after vaginal delivery  . Breast mass in female 2003  . Broken foot 03/16/2016   right  . Bruises easily   . BV (bacterial vaginosis)   . Cyst of ovary  2003  . Depression 2003  . DUB (dysfunctional uterine bleeding) 2003  . DVT, lower extremity ([place]) 10/2014   left leg  . Fibroid    LONG TERM DX  . Fibromyalgia 2004  . Fibrositis   . GERD (gastroesophageal reflux disease) 2007  . H/O amenorrhea 2006  . H/O menorrhagia 2011  . H/O sickle cell trait   . Heart murmur 2007  . History of abnormal cervical Pap smear 2003   had colposcopy and cryo.  Follow up paps are normal.  . Hx: UTI (urinary tract infection) 2003  . Hypertension 2005   RESOLVED WITH WT LOSS  . IBD (inflammatory bowel disease)   . IBS (irritable bowel syndrome)   . Infection    OCC YEAST  . Low back pain 2003  . Menses, irregular 2003  . Migraine   . Obese 2003  . Pulmonary embolism ([place]) 10/2014  . Sacroiliitis ([place])   . Sickle cell anemia ([facility])   . Sickle-cell trait ([place])   . Trichomonas   . Uterine leiomyoma   . Vaginitis and vulvovaginitis 2004  . Yeast vaginitis 04/24/2010    Past Surgical History:  Procedure Laterality Date  . AUGMENTATION MAMMAPLASTY  [id number]   Silicone implants  . broken ankle Right   . CERVICAL CONE BIOPSY  1997  . COSMETIC SURGERY  2012   thigh lift  . CYSTECTOMY Right    obtain date from patient, was not on history form dated 06/16/10--benign cyst removed Rt.ovary  . DILATION AND CURETTAGE OF UTERUS    . LAPAROSCOPIC GASTRIC BANDING  05/20/2007  . WISDOM TOOTH EXTRACTION      Current Outpatient Prescriptions  Medication Sig Dispense Refill  . ALPRAZolam (XANAX XR) 1 MG 24 hr tablet Take 1 mg by mouth as needed for anxiety.    . ALPRAZolam (XANAX) 1 MG tablet Take 1 mg by mouth 3 (three) times daily as needed for anxiety.     [name] buPROPion (WELLBUTRIN XL) 150 MG 24 hr tablet Take 3 tablets by mouth daily.  12  . cetirizine (ZYRTEC) 10 MG tablet Take 10 mg by mouth.    . fluticasone (FLONASE) 50 MCG/ACT nasal spray 1 spray by Each Nare route Three (3) times a day as needed.    . lubiprostone (AMITIZA) 24 MCG capsule Take 1  capsule (24 mcg total) by mouth daily with breakfast. 30 capsule 5  . omeprazole (PRILOSEC) 40 MG capsule Take 1 capsule by mouth daily.    . ondansetron (ZOFRAN) 4 MG tablet Take 4 mg by mouth.    . oxyCODONE-acetaminophen (PERCOCET/ROXICET) 5-325 MG tablet Take 1 tablet by mouth every 4 (four) hours as needed for severe pain. 20 tablet 0  . potassium chloride (K-DUR,KLOR-CON) 20 MEQ tablet Take 2 tablets (40 mEq total) by mouth 2 (two) times daily. 20 tablet 0  . REXULTI 3 MG TABS Take 1 tablet by mouth daily.  3  . VIIBRYD 40 MG TABS Take 1 tablet by mouth daily.  1  . Vitamin D, Ergocalciferol, (DRISDOL) 50000 UNITS CAPS capsule Take 50,000 Units by mouth 2 (two) times a week. Takes on sun and wed    . zolpidem (AMBIEN) 10 MG tablet Take 10 mg by mouth at bedtime.     No current facility-administered medications for this visit.     Family History  Problem Relation Age of Onset  . Diabetes Mother   . Arthritis Mother   . Hyperlipidemia Mother   . Other Mother     VARICOSE VEINS; DECREASED HEART RATE; +Hysterectomy for unspecified reason  . Fibromyalgia Mother   . Colon polyps Mother     unspecified number  . Depression Mother   . Heart disease Father   . Hypertension Father   . Alcohol abuse Father   . Drug abuse Father   . Stroke Father   . Ovarian cancer Sister     dx. <43y  . Cervical cancer Sister 36  . Multiple sclerosis Brother   . Thyroid disease Brother     hypothyroidism  . Lung cancer Maternal Grandmother 74    secondhand smoke  . Depression Maternal Grandmother   . Miscarriages / Stillbirths Maternal Grandmother   . Birth defects Maternal Aunt     EXTRA DIGITS; MUSCULAR DISEASE  . Early death Maternal Aunt 54  . COPD Maternal Grandfather   . Stroke Maternal Grandfather   . Ovarian cancer Maternal Aunt 72    Dec.age 58 Ovarian CA  . Early death Maternal Aunt     d. three days after birth; unknown cause  . Breast cancer Maternal Aunt 41  . Colon polyps  Maternal Aunt     unspecified number  . Liver disease Paternal Aunt   . Ovarian cancer Maternal Aunt 36  . Depression Maternal Aunt   . Breast cancer Other     (  x2 maternal great aunts) dx. with breast cancers at unspecified ages  . Breast cancer Other     (x3 maternal 1st cousins, once-removed with breast cancer)  . Colon cancer Other     (x1) maternal first cousin, once-removed died of colon cancer in her 6s  . Heart attack Paternal Uncle     in mid-50s  . Alcohol abuse Maternal Uncle     ROS:  Pertinent items are noted in HPI.  Otherwise, a comprehensive ROS was negative.  Exam:   BP 132/72 (BP Location: Right Arm, Patient Position: Sitting, Cuff Size: Normal)   Pulse 90   Resp 16   Ht 5' 5.5" (1.664 m)   Wt 199 lb (90.3 kg)   LMP 03/09/2016 (Approximate)   BMI 32.61 kg/m     General appearance: alert, cooperative and appears stated age Head: Normocephalic, without obvious abnormality, atraumatic Neck: no adenopathy, supple, symmetrical, trachea midline and thyroid normal to inspection and palpation Lungs: clear to auscultation bilaterally Breasts: bilateral implants, normal appearance, no masses or tenderness, No nipple retraction or dimpling, No nipple discharge or bleeding, No axillary or supraclavicular adenopathy Heart: regular rate and rhythm Abdomen: port site balloon palpable in right upper abdomen, vertical infraumbilical incision, soft, non-tender; no masses, no organomegaly Extremities: extremities normal, atraumatic, no cyanosis or edema.  Wearing boot on her foot.  Skin: bilateral thigh incisions, skin color, texture, turgor normal. No rashes or lesions.  Multiple tatoos. Lymph nodes: Cervical, supraclavicular, and axillary nodes normal. No abnormal inguinal nodes palpated Neurologic: Grossly normal  Pelvic: External genitalia:  Vulva with grey discoloration of the labia majora and perineum consistent with yeast and inflammation.               Urethra:   normal appearing urethra with no masses, tenderness or lesions              Bartholins and Skenes: normal                 Vagina: normal appearing vagina with normal color and discharge, no lesions              Cervix: no lesions              Pap taken: No. Bimanual Exam:  Uterus:  normal size, contour, position, consistency, mobility, non-tender              Adnexa: no mass, fullness, tenderness              Rectal exam: Yes.  .  Confirms.              Anus:  normal sphincter tone, no lesions  Chaperone was present for exam.  Assessment:   Well woman visit with normal exam. HX DVT and PE.  FH of breast and ovarian cancer.   Negative genetic testing.  Increased lifetime risk of breast cancer 20.2%. Bilateral implants. Genuine stress incontinence. Vulvitis.  I think this is a combination of Candida and reaction to pad use.  Plan: Yearly mammogram recommended after age 6.  Recommended self breast exam.  Pap and HR HPV as above. Guidelines for Calcium, Vitamin D, regular exercise program including cardiovascular and weight bearing exercise. Wants to pursue breast MRI. I have ordered this but patient will need to wait until next year's mammogram. TDap today.  Kegel's.  Discussed PT and surgery if desires.   Declines both currently.  Affirm.  Mycolog II and Diflucan Rx. Switch sanitary pads/panty shields.  Follow up annually and prn.   An additional 10 minutes spent regarding vulvitis, genuine stress incontinence, and FH of breast cancer. Over 50% was spent in counseling.   After visit summary provided.

## 2016-03-26 ENCOUNTER — Encounter: Payer: Self-pay | Admitting: Obstetrics and Gynecology

## 2016-03-26 ENCOUNTER — Ambulatory Visit (INDEPENDENT_AMBULATORY_CARE_PROVIDER_SITE_OTHER): Payer: Managed Care, Other (non HMO) | Admitting: Obstetrics and Gynecology

## 2016-03-26 ENCOUNTER — Other Ambulatory Visit: Payer: Self-pay | Admitting: Family Medicine

## 2016-03-26 VITALS — BP 132/72 | HR 90 | Resp 16 | Ht 65.5 in | Wt 199.0 lb

## 2016-03-26 DIAGNOSIS — Z9189 Other specified personal risk factors, not elsewhere classified: Secondary | ICD-10-CM

## 2016-03-26 DIAGNOSIS — N762 Acute vulvitis: Secondary | ICD-10-CM | POA: Diagnosis not present

## 2016-03-26 DIAGNOSIS — Z01411 Encounter for gynecological examination (general) (routine) with abnormal findings: Secondary | ICD-10-CM | POA: Diagnosis not present

## 2016-03-26 DIAGNOSIS — Z803 Family history of malignant neoplasm of breast: Secondary | ICD-10-CM | POA: Diagnosis not present

## 2016-03-26 DIAGNOSIS — M25571 Pain in right ankle and joints of right foot: Secondary | ICD-10-CM

## 2016-03-26 DIAGNOSIS — Z23 Encounter for immunization: Secondary | ICD-10-CM

## 2016-03-26 MED ORDER — FLUCONAZOLE 150 MG PO TABS: 150.0000 mg | ORAL_TABLET | Freq: Once | ORAL | 0 refills | Status: AC

## 2016-03-26 MED ORDER — NYSTATIN-TRIAMCINOLONE 100000-0.1 UNIT/GM-% EX CREA: 1.0000 "application " | TOPICAL_CREAM | Freq: Two times a day (BID) | CUTANEOUS | 0 refills | Status: DC

## 2016-03-26 NOTE — Patient Instructions (Signed)

## 2016-03-27 LAB — WET PREP BY MOLECULAR PROBE
Candida species: NEGATIVE
GARDNERELLA VAGINALIS: NEGATIVE
Trichomonas vaginosis: NEGATIVE

## 2016-03-28 ENCOUNTER — Ambulatory Visit
Admission: RE | Admit: 2016-03-28 | Discharge: 2016-03-28 | Disposition: A | Discharge status: Home / Self Care | Payer: Managed Care, Other (non HMO) | Source: Ambulatory Visit | Attending: Family Medicine | Admitting: Family Medicine

## 2016-03-28 ENCOUNTER — Ambulatory Visit
Admission: RE | Admit: 2016-03-28 | Discharge: 2016-03-28 | Disposition: A | Discharge status: Home / Self Care | Payer: Managed Care, Other (non HMO) | Source: Ambulatory Visit | Attending: Gastroenterology | Admitting: Gastroenterology

## 2016-03-28 DIAGNOSIS — R198 Other specified symptoms and signs involving the digestive system and abdomen: Secondary | ICD-10-CM

## 2016-03-28 DIAGNOSIS — M25571 Pain in right ankle and joints of right foot: Secondary | ICD-10-CM

## 2016-03-28 MED ORDER — IOPAMIDOL (ISOVUE-300) INJECTION 61%
100.0000 mL | Freq: Once | INTRAVENOUS | Status: AC | PRN
  Administered 2016-03-28: 100 mL via INTRAVENOUS

## 2016-04-10 ENCOUNTER — Telehealth: Payer: Self-pay | Admitting: Obstetrics and Gynecology

## 2016-04-10 ENCOUNTER — Telehealth: Payer: Self-pay

## 2016-04-10 DIAGNOSIS — Z803 Family history of malignant neoplasm of breast: Secondary | ICD-10-CM

## 2016-04-10 DIAGNOSIS — R140 Abdominal distension (gaseous): Secondary | ICD-10-CM

## 2016-04-10 DIAGNOSIS — Z8041 Family history of malignant neoplasm of ovary: Secondary | ICD-10-CM

## 2016-04-10 DIAGNOSIS — Z9189 Other specified personal risk factors, not elsewhere classified: Secondary | ICD-10-CM

## 2016-04-10 NOTE — Telephone Encounter (Signed)
Tiffany [name] from [facility] called requesting this patient's order be changed from MRI of the breast with contrast to MRI of the breast with AND without contrast.

## 2016-04-10 NOTE — Telephone Encounter (Addendum)
Spoke with International aid/development worker at [facility]. Advised new order placed as request below.   Routing to provider for review, will close encounter.

## 2016-04-10 NOTE — Telephone Encounter (Signed)
Spoke with patient. Patient had a CT abdomen w contrast performed for evaluation of bloating. This was ordered by Dr.Mann. Dr.Silva reviewed results as well and would like to offer PUS to the patient for evaluation of ovaries. Patient has a family medical history of ovarian cancer (please see CT results with note from [facility] to be scanned into EPIC). Patient would like to proceed with PUS at this time. PUS scheduled for 04/12/2016 at 2:30 pm with 3 pm consult with Dr.Silva. Patient is agreeable to date and time. Order placed for precert.  Routing to provider for final review. Patient agreeable to disposition. Will close encounter.

## 2016-04-12 ENCOUNTER — Ambulatory Visit (INDEPENDENT_AMBULATORY_CARE_PROVIDER_SITE_OTHER): Payer: Managed Care, Other (non HMO) | Admitting: Obstetrics and Gynecology

## 2016-04-12 ENCOUNTER — Encounter: Payer: Self-pay | Admitting: Obstetrics and Gynecology

## 2016-04-12 ENCOUNTER — Telehealth: Payer: Self-pay | Admitting: *Deleted

## 2016-04-12 ENCOUNTER — Ambulatory Visit (INDEPENDENT_AMBULATORY_CARE_PROVIDER_SITE_OTHER): Payer: Managed Care, Other (non HMO)

## 2016-04-12 VITALS — BP 114/70 | HR 88 | Ht 65.5 in | Wt 194.0 lb

## 2016-04-12 DIAGNOSIS — R140 Abdominal distension (gaseous): Secondary | ICD-10-CM

## 2016-04-12 DIAGNOSIS — D259 Leiomyoma of uterus, unspecified: Secondary | ICD-10-CM

## 2016-04-12 DIAGNOSIS — Z8041 Family history of malignant neoplasm of ovary: Secondary | ICD-10-CM

## 2016-04-12 NOTE — Progress Notes (Signed)
Encounter reviewed by Dr. Brook Amundson C. Silva.  

## 2016-04-12 NOTE — Progress Notes (Signed)
Patient ID: Tiffany [name], female   DOB: 02-14-1976, 41 y.o.   MRN: [id number] GYNECOLOGY  VISIT   HPI: 41 y.o.   Single  African American  female   [phone] with Patient's last menstrual period was 04/10/2016 (exact date).   here for pelvic ultrasound for family history of ovarian cancer.    Patient left 10 minutes after her appointment time with physician provider without being seen.  She stated her ride was waiting for her.   GYNECOLOGIC HISTORY: Patient's last menstrual period was 04/10/2016 (exact date). Contraception:  Abstinence Menopausal hormone therapy:  n/a Last mammogram:  06-10-15 3D/Neg/BiRads1:TBC Last pap smear: 02-18-15 Neg:Neg HR HPV        OB History    Gravida Para Term Preterm AB Living   12 6 6   6 6    SAB TAB Ectopic Multiple Live Births   6       6         Patient Active Problem List   Diagnosis Date Noted  . Genetic testing 06/13/2015  . Family history of breast cancer in female 06/02/2015  . Family history of ovarian cancer 06/02/2015  . BV (bacterial vaginosis) 02/18/2015  . Hemorrhoid 02/18/2015  . Muscle pain 11/30/2014  . Left leg DVT ([place]) 11/18/2014  . Hx of laparoscopic gastric banding 01/29/2012  . Depression 12/19/2011  . Fibromyalgia 12/19/2011  . Arthritis 12/19/2011  . IBS (irritable bowel syndrome) 12/19/2011  . GERD (gastroesophageal reflux disease) 12/19/2011  . Fibroids 12/19/2011    Past Medical History:  Diagnosis Date  . Abnormal Pap smear 1997   LAST PAP 10/2010  . Allergy    SEASONAL  . Anemia    CHRONIC  . 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 Arthritis   . Blood dyscrasia    SICKLE CELL TRAIT  . Blood transfusion without reported diagnosis 2005   after vaginal delivery  . Breast mass in female 2003  . Broken foot 03/16/2016   right  . Bruises easily   . BV (bacterial vaginosis)   . Cyst of ovary 2003  . Depression 2003  . DUB (dysfunctional uterine bleeding) 2003  . DVT, lower extremity ([place]) 10/2014   left  leg  . Fibroid    LONG TERM DX  . Fibromyalgia 2004  . Fibrositis   . GERD (gastroesophageal reflux disease) 2007  . H/O amenorrhea 2006  . H/O menorrhagia 2011  . H/O sickle cell trait   . Heart murmur 2007  . History of abnormal cervical Pap smear 2003   had colposcopy and cryo.  Follow up paps are normal.  . Hx: UTI (urinary tract infection) 2003  . Hypertension 2005   RESOLVED WITH WT LOSS  . IBD (inflammatory bowel disease)   . IBS (irritable bowel syndrome)   . Infection    OCC YEAST  . Low back pain 2003  . Menses, irregular 2003  . Migraine   . Obese 2003  . Pulmonary embolism ([place]) 10/2014  . Sacroiliitis ([place])   . Sickle cell anemia ([facility])   . Sickle-cell trait ([place])   . Trichomonas   . Uterine leiomyoma   . Vaginitis and vulvovaginitis 2004  . Yeast vaginitis 04/24/2010    Past Surgical History:  Procedure Laterality Date  . AUGMENTATION MAMMAPLASTY  [id number]   Silicone implants  . broken ankle Right   . CERVICAL CONE BIOPSY  1997  . COSMETIC SURGERY  2012   thigh lift  .  CYSTECTOMY Right    obtain date from patient, was not on history form dated 06/16/10--benign cyst removed Rt.ovary  . DILATION AND CURETTAGE OF UTERUS    . LAPAROSCOPIC GASTRIC BANDING  05/20/2007  . WISDOM TOOTH EXTRACTION      Current Outpatient Prescriptions  Medication Sig Dispense Refill  . ALPRAZolam (XANAX XR) 1 MG 24 hr tablet Take 1 mg by mouth as needed for anxiety.    . ALPRAZolam (XANAX) 1 MG tablet Take 1 mg by mouth 3 (three) times daily as needed for anxiety.     [name] buPROPion (WELLBUTRIN XL) 150 MG 24 hr tablet Take 3 tablets by mouth daily.  12  . cetirizine (ZYRTEC) 10 MG tablet Take 10 mg by mouth.    . fluticasone (FLONASE) 50 MCG/ACT nasal spray 1 spray by Each Nare route Three (3) times a day as needed.    . lubiprostone (AMITIZA) 24 MCG capsule Take 1 capsule (24 mcg total) by mouth daily with breakfast. 30 capsule 5  . nystatin-triamcinolone (MYCOLOG II) cream Apply 1  application topically 2 (two) times daily. Apply to affected area BID for up to 7 days. 60 g 0  . omeprazole (PRILOSEC) 40 MG capsule Take 1 capsule by mouth daily.    . ondansetron (ZOFRAN) 4 MG tablet Take 4 mg by mouth.    . oxyCODONE-acetaminophen (PERCOCET/ROXICET) 5-325 MG tablet Take 1 tablet by mouth every 4 (four) hours as needed for severe pain. 20 tablet 0  . potassium chloride (K-DUR,KLOR-CON) 20 MEQ tablet Take 2 tablets (40 mEq total) by mouth 2 (two) times daily. 20 tablet 0  . REXULTI 3 MG TABS Take 1 tablet by mouth daily.  3  . VIIBRYD 40 MG TABS Take 1 tablet by mouth daily.  1  . Vitamin D, Ergocalciferol, (DRISDOL) 50000 UNITS CAPS capsule Take 50,000 Units by mouth 2 (two) times a week. Takes on sun and wed    . zolpidem (AMBIEN) 10 MG tablet Take 10 mg by mouth at bedtime.     No current facility-administered medications for this visit.      ALLERGIES: Flexeril [cyclobenzaprine hcl]; Latex; and Other  Family History  Problem Relation Age of Onset  . Diabetes Mother   . Arthritis Mother   . Hyperlipidemia Mother   . Other Mother     VARICOSE VEINS; DECREASED HEART RATE; +Hysterectomy for unspecified reason  . Fibromyalgia Mother   . Colon polyps Mother     unspecified number  . Depression Mother   . Heart disease Father   . Hypertension Father   . Alcohol abuse Father   . Drug abuse Father   . Stroke Father   . Ovarian cancer Sister     dx. <43y  . Cervical cancer Sister 54  . Multiple sclerosis Brother   . Thyroid disease Brother     hypothyroidism  . Lung cancer Maternal Grandmother 74    secondhand smoke  . Depression Maternal Grandmother   . Miscarriages / Stillbirths Maternal Grandmother   . Birth defects Maternal Aunt     EXTRA DIGITS; MUSCULAR DISEASE  . Early death Maternal Aunt 23  . COPD Maternal Grandfather   . Stroke Maternal Grandfather   . Ovarian cancer Maternal Aunt 55    Dec.age 60 Ovarian CA  . Early death Maternal Aunt     d.  three days after birth; unknown cause  . Breast cancer Maternal Aunt 43  . Colon polyps Maternal Aunt  unspecified number  . Liver disease Paternal Aunt   . Ovarian cancer Maternal Aunt 84  . Depression Maternal Aunt   . Breast cancer Other     (x2 maternal great aunts) dx. with breast cancers at unspecified ages  . Breast cancer Other     (x3 maternal 1st cousins, once-removed with breast cancer)  . Colon cancer Other     (x1) maternal first cousin, once-removed died of colon cancer in her 73s  . Heart attack Paternal Uncle     in mid-50s  . Alcohol abuse Maternal Uncle     Social History   Social History  . Marital status: Single    Spouse name: N/A  . Number of children: 5  . Years of education: 30   Occupational History  . CLINICAL RESEARCH    Social History Main Topics  . Smoking status: Never Smoker  . Smokeless tobacco: Never Used  . Alcohol use 0.6 oz/week    1 Glasses of wine per week  . Drug use: No  . Sexual activity: Not Currently    Partners: Male    Birth control/ protection: Abstinence, None   Other Topics Concern  . Not on file   Social History Narrative  . No narrative on file    ROS:  Pertinent items are noted in HPI.  PHYSICAL EXAMINATION:    BP 114/70 (BP Location: Left Arm, Patient Position: Sitting, Cuff Size: Large)   Pulse 88   Ht 5' 5.5" (1.664 m)   Wt 194 lb (88 kg)   LMP 04/10/2016 (Exact Date)   BMI 31.79 kg/m     General appearance: alert, cooperative and appears stated age   Pelvic ultrasound 6 small fiboids - about 1 cm or less.  EMS 6.89 mm.  Normal ovaries.  No free fluid.  ASSESSMENT  Abdominal bloating.  Multifibroid uterus.  Normal ovaries.  FH ovarian cancer.  PLAN  Return for consultation visit.

## 2016-04-12 NOTE — Telephone Encounter (Signed)
Spoke with patient. Patient calling because her cycle started today and she is scheduled for a PUS at 2:30pm with Dr. Quincy Simmonds. Patient asking if PUS can be done while on cycle? Advised patient if she is comfortable with having PUS done while on cycle the PUS can be done. Patient states she will keep PUS appt for today. Patient verbalizes understanding and is agreeable.  Routing to provider for final review. Patient is agreeable to disposition. Will close encounter.

## 2016-04-13 ENCOUNTER — Ambulatory Visit: Payer: Managed Care, Other (non HMO) | Admitting: Obstetrics and Gynecology

## 2016-04-13 ENCOUNTER — Encounter: Payer: Self-pay | Admitting: Obstetrics and Gynecology

## 2016-04-13 NOTE — Progress Notes (Deleted)
Patient ID: Tiffany [name], female   DOB: 05/05/1975, 41 y.o.   MRN: [id number] GYNECOLOGY  VISIT   HPI: 41 y.o.   Single  African American  female   [phone] with Patient's last menstrual period was 04/10/2016 (exact date).   here for pelvic ultrasound for family history of ovarian cancer.    GYNECOLOGIC HISTORY: Patient's last menstrual period was 04/10/2016 (exact date). Contraception:  Abstinence Menopausal hormone therapy:  n/a Last mammogram:  06-10-15 3D/Neg/BiRads1:TBC  Last pap smear:    02-18-15 Neg:Neg HR HPV        OB History    Gravida Para Term Preterm AB Living   12 6 6   6 6    SAB TAB Ectopic Multiple Live Births   6       6         Patient Active Problem List   Diagnosis Date Noted  . Genetic testing 06/13/2015  . Family history of breast cancer in female 06/02/2015  . Family history of ovarian cancer 06/02/2015  . BV (bacterial vaginosis) 02/18/2015  . Hemorrhoid 02/18/2015  . Muscle pain 11/30/2014  . Left leg DVT ([place]) 11/18/2014  . Hx of laparoscopic gastric banding 01/29/2012  . Depression 12/19/2011  . Fibromyalgia 12/19/2011  . Arthritis 12/19/2011  . IBS (irritable bowel syndrome) 12/19/2011  . GERD (gastroesophageal reflux disease) 12/19/2011  . Fibroids 12/19/2011    Past Medical History:  Diagnosis Date  . Abnormal Pap smear 1997   LAST PAP 10/2010  . Allergy    SEASONAL  . Anemia    CHRONIC  . 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 Arthritis   . Blood dyscrasia    SICKLE CELL TRAIT  . Blood transfusion without reported diagnosis 2005   after vaginal delivery  . Breast mass in female 2003  . Broken foot 03/16/2016   right  . Bruises easily   . BV (bacterial vaginosis)   . Cyst of ovary 2003  . Depression 2003  . DUB (dysfunctional uterine bleeding) 2003  . DVT, lower extremity ([place]) 10/2014   left leg  . Fibroid    LONG TERM DX  . Fibromyalgia 2004  . Fibrositis   . GERD (gastroesophageal reflux disease) 2007  . H/O amenorrhea  2006  . H/O menorrhagia 2011  . H/O sickle cell trait   . Heart murmur 2007  . History of abnormal cervical Pap smear 2003   had colposcopy and cryo.  Follow up paps are normal.  . Hx: UTI (urinary tract infection) 2003  . Hypertension 2005   RESOLVED WITH WT LOSS  . IBD (inflammatory bowel disease)   . IBS (irritable bowel syndrome)   . Infection    OCC YEAST  . Low back pain 2003  . Menses, irregular 2003  . Migraine   . Obese 2003  . Pulmonary embolism ([place]) 10/2014  . Sacroiliitis ([place])   . Sickle cell anemia ([facility])   . Sickle-cell trait ([place])   . Trichomonas   . Uterine leiomyoma   . Vaginitis and vulvovaginitis 2004  . Yeast vaginitis 04/24/2010    Past Surgical History:  Procedure Laterality Date  . AUGMENTATION MAMMAPLASTY  [id number]   Silicone implants  . broken ankle Right   . CERVICAL CONE BIOPSY  1997  . COSMETIC SURGERY  2012   thigh lift  . CYSTECTOMY Right    obtain date from patient, was not on history form dated 06/16/10--benign cyst removed Rt.ovary  .  DILATION AND CURETTAGE OF UTERUS    . LAPAROSCOPIC GASTRIC BANDING  05/20/2007  . WISDOM TOOTH EXTRACTION      Current Outpatient Prescriptions  Medication Sig Dispense Refill  . ALPRAZolam (XANAX XR) 1 MG 24 hr tablet Take 1 mg by mouth as needed for anxiety.    . ALPRAZolam (XANAX) 1 MG tablet Take 1 mg by mouth 3 (three) times daily as needed for anxiety.     [name] buPROPion (WELLBUTRIN XL) 150 MG 24 hr tablet Take 3 tablets by mouth daily.  12  . cetirizine (ZYRTEC) 10 MG tablet Take 10 mg by mouth.    . fluticasone (FLONASE) 50 MCG/ACT nasal spray 1 spray by Each Nare route Three (3) times a day as needed.    . lubiprostone (AMITIZA) 24 MCG capsule Take 1 capsule (24 mcg total) by mouth daily with breakfast. 30 capsule 5  . nystatin-triamcinolone (MYCOLOG II) cream Apply 1 application topically 2 (two) times daily. Apply to affected area BID for up to 7 days. 60 g 0  . omeprazole (PRILOSEC) 40 MG capsule Take  1 capsule by mouth daily.    . ondansetron (ZOFRAN) 4 MG tablet Take 4 mg by mouth.    . oxyCODONE-acetaminophen (PERCOCET/ROXICET) 5-325 MG tablet Take 1 tablet by mouth every 4 (four) hours as needed for severe pain. 20 tablet 0  . potassium chloride (K-DUR,KLOR-CON) 20 MEQ tablet Take 2 tablets (40 mEq total) by mouth 2 (two) times daily. 20 tablet 0  . REXULTI 3 MG TABS Take 1 tablet by mouth daily.  3  . VIIBRYD 40 MG TABS Take 1 tablet by mouth daily.  1  . Vitamin D, Ergocalciferol, (DRISDOL) 50000 UNITS CAPS capsule Take 50,000 Units by mouth 2 (two) times a week. Takes on sun and wed    . zolpidem (AMBIEN) 10 MG tablet Take 10 mg by mouth at bedtime.     No current facility-administered medications for this visit.      ALLERGIES: Flexeril [cyclobenzaprine hcl]; Latex; and Other  Family History  Problem Relation Age of Onset  . Diabetes Mother   . Arthritis Mother   . Hyperlipidemia Mother   . Other Mother     VARICOSE VEINS; DECREASED HEART RATE; +Hysterectomy for unspecified reason  . Fibromyalgia Mother   . Colon polyps Mother     unspecified number  . Depression Mother   . Heart disease Father   . Hypertension Father   . Alcohol abuse Father   . Drug abuse Father   . Stroke Father   . Ovarian cancer Sister     dx. <43y  . Cervical cancer Sister 70  . Multiple sclerosis Brother   . Thyroid disease Brother     hypothyroidism  . Lung cancer Maternal Grandmother 74    secondhand smoke  . Depression Maternal Grandmother   . Miscarriages / Stillbirths Maternal Grandmother   . Birth defects Maternal Aunt     EXTRA DIGITS; MUSCULAR DISEASE  . Early death Maternal Aunt 80  . COPD Maternal Grandfather   . Stroke Maternal Grandfather   . Ovarian cancer Maternal Aunt 81    Dec.age 51 Ovarian CA  . Early death Maternal Aunt     d. three days after birth; unknown cause  . Breast cancer Maternal Aunt 53  . Colon polyps Maternal Aunt     unspecified number  . Liver  disease Paternal Aunt   . Ovarian cancer Maternal Aunt 2  . Depression Maternal Aunt   .  Breast cancer Other     (x2 maternal great aunts) dx. with breast cancers at unspecified ages  . Breast cancer Other     (x3 maternal 1st cousins, once-removed with breast cancer)  . Colon cancer Other     (x1) maternal first cousin, once-removed died of colon cancer in her 32s  . Heart attack Paternal Uncle     in mid-50s  . Alcohol abuse Maternal Uncle     Social History   Social History  . Marital status: Single    Spouse name: N/A  . Number of children: 5  . Years of education: 48   Occupational History  . CLINICAL RESEARCH    Social History Main Topics  . Smoking status: Never Smoker  . Smokeless tobacco: Never Used  . Alcohol use 0.6 oz/week    1 Glasses of wine per week  . Drug use: No  . Sexual activity: Not Currently    Partners: Male    Birth control/ protection: Abstinence, None   Other Topics Concern  . Not on file   Social History Narrative  . No narrative on file    ROS:  Pertinent items are noted in HPI.  PHYSICAL EXAMINATION:    LMP 04/10/2016 (Exact Date)     General appearance: alert, cooperative and appears stated age Head: Normocephalic, without obvious abnormality, atraumatic Neck: no adenopathy, supple, symmetrical, trachea midline and thyroid {EXAM; THYROID:18604} Lungs: clear to auscultation bilaterally Breasts: {Exam; breast:13139::"normal appearance, no masses or tenderness"} Heart: regular rate and rhythm Abdomen: incision(s):{yes no:314532}, ____________  soft, non-tender, no masses,  no organomegaly Extremities: extremities normal, atraumatic, no cyanosis or edema Skin: Skin color, texture, turgor normal. No rashes or lesions Lymph nodes: Cervical, supraclavicular, and axillary nodes normal. No abnormal inguinal nodes palpated Neurologic: Grossly normal  Pelvic: External genitalia:  no lesions              Urethra:  normal appearing  urethra with no masses, tenderness or lesions              Bartholins and Skenes: normal                 Vagina: normal appearing vagina with normal color and discharge, no lesions              Cervix: {exam; cervix:14595}              Pap taken: {yes no:314532} Bimanual Exam:  Uterus:  {exam; uterus:12215}              Adnexa: {exam; adnexa:12223}              Rectal exam: {yes no:314532}.  Confirms.              Anus:  normal sphincter tone, no lesions  Chaperone was present for exam.  ASSESSMENT     PLAN     An After Visit Summary was printed and given to the patient.  ______ minutes face to face time of which over 50% was spent in counseling.

## 2016-04-20 ENCOUNTER — Telehealth: Payer: Self-pay | Admitting: Obstetrics and Gynecology

## 2016-04-20 NOTE — Telephone Encounter (Signed)
Spoke with patient, advised as seen below per Dr. Quincy Simmonds. Patient states she had to have surgery on her foot and was trying to tell the office that when she called to reschedule. Patient states she is not supposed to be up for the next 2 weeks and declines scheduling consultation appointment at this time. Patient states she will return call at later date to schedule.Patient verbalizes understanding and is agreeable.  Routing to provider for final review. Patient is agreeable to disposition. Will close encounter.  Cc: Lamont Snowball

## 2016-04-20 NOTE — Telephone Encounter (Signed)
Please contact patient in follow up to her ultrasound done on 04/13/15. She left immediately after the ultrasound was performed and did not do the consultation following this.  She then rescheduled her appointment for the following day and failed that appointment.  She has not since rescheduled this any further.  Please let her know that the ultrasound showed 6 fibroids that are 1 cm or less in size.  She has normal ovaries.

## 2016-05-08 ENCOUNTER — Ambulatory Visit: Payer: Managed Care, Other (non HMO) | Admitting: Certified Nurse Midwife

## 2016-05-08 ENCOUNTER — Encounter: Payer: Self-pay | Admitting: Certified Nurse Midwife

## 2016-05-08 VITALS — BP 110/76 | HR 70 | Resp 16 | Ht 65.5 in | Wt 204.0 lb

## 2016-05-08 DIAGNOSIS — L298 Other pruritus: Secondary | ICD-10-CM | POA: Diagnosis not present

## 2016-05-08 DIAGNOSIS — N926 Irregular menstruation, unspecified: Secondary | ICD-10-CM

## 2016-05-08 DIAGNOSIS — N898 Other specified noninflammatory disorders of vagina: Secondary | ICD-10-CM

## 2016-05-08 DIAGNOSIS — Z113 Encounter for screening for infections with a predominantly sexual mode of transmission: Secondary | ICD-10-CM

## 2016-05-08 LAB — POCT URINE PREGNANCY: PREG TEST UR: NEGATIVE

## 2016-05-08 NOTE — Progress Notes (Signed)
41 y.o. Single African American female [phone] here with complaint of vaginal symptoms of itching, burning, and increase discharge. Describes discharge as watery, with odor.. Onset of symptoms 7 days ago. Denies new personal products. No STD concerns, but desires screening.[name] Urinary symptoms none . Contraception is condoms. Has become sexually active again and feels this may have caused this. Periods normal, no issues. No other health issues today.  ROS: Pertinent to HPI  O:Healthy female WDWN Affect: normal, orientation x 3  Exam: Abdomen:soft, non tender  Inguinal Lymph nodes: no enlargement or tenderness Pelvic exam: External genital: normal female BUS: negative Vagina: slight watery discharge noted.  Affirm taken Cervix: normal, non tender, no CMT Uterus: normal, non tender Adnexa:normal, non tender, no masses or fullness noted  Poct upt-neg  A:Normal pelvic exam R/O vaginal infection  STD screening Contraception sporadic condom use, interested in progesterone only options History of DVT   P:Discussed findings of normal pelvic exam. Discussed Aveeno or baking soda sitz bath for comfort until lab results are in .  Labs: Affirm, GC,Chlamydia Discussed IUD,POP and Nexplanon as contraception options due to history of DVT.[name] Discussed risks/benefits and bleeding expectations. Patient has had IUD and used Depo Provera. May lean toward IUD again. Will use consistent condoms until she decides and will advise.   Rv prn

## 2016-05-08 NOTE — Patient Instructions (Signed)
Intrauterine Device Information Introduction An intrauterine device (IUD) is a medical device that gets inserted into the uterus to prevent pregnancy. It is a small, T-shaped device that has one or two nylon strings hanging down from it. The strings hang out of the lower part of the uterus (cervix) to allow for future IUD removal. There are two types of IUDs available:  Copper IUD. This type of IUD has copper wire wrapped around it. A copper IUD may last up to 10 years.  Hormone IUD. This type of IUD is made of plastic and contains the hormone progestin (synthetic progesterone). A hormone IUD may last 3 to 5 years. IUDs are inserted through the vagina and placed into the uterus with a minor medical procedure. How does the IUD work? Copper in the copper IUD prevents pregnancy by making the uterus and fallopian tubes produce a fluid that kills sperm. Synthetic progesterone in hormonal IUD prevents pregnancy by:  Thickening cervical mucus to prevent sperm from entering the uterus.  Thinning the uterine lining to prevent implantation of a fertilized egg.  Weakening or killing sperm that get into the uterus. What are the advantages of an IUD?  IUDs are highly effective, reversible, long-acting, and low-maintenance.  There are no estrogen-related side effects.  An IUD can be used when breastfeeding.  IUDs are not associated with weight gain.  Advantages of the copper IUD are that:  It works immediately after insertion.  It does not interfere with your body's natural hormones.  It can be used for 10 years.  Advantages of the hormone IUD are that:  If it is inserted within 7 days of your period starting, it works immediately after insertion. If the hormone IUD is inserted at any other time in your cycle, you will need to use a backup method of birth control for 7 days after insertion.  It can make menstrual periods lighter.  It can decrease menstrual cramping.  It can be used for 3  or 5 years. What are the disadvantages of an IUD?  The hormone IUD may cause irregular menstrual bleeding for a period of time after insertion.  The copper IUD can make your menstrual flow heavier and more painful.  You may experience some pain during insertion, and cramping and vaginal bleeding after insertion. How is the IUD removed? Is the IUD right for me?  Your health care provider will make sure you are a good candidate for an IUD and will discuss side effects with you. This information is not intended to replace advice given to you by your health care provider. Make sure you discuss any questions you have with your health care provider. Document Released: 02/28/2004 Document Revised: 09/01/2015 Document Reviewed: 09/14/2012  2017 Elsevier  

## 2016-05-09 ENCOUNTER — Other Ambulatory Visit: Payer: Self-pay

## 2016-05-09 ENCOUNTER — Other Ambulatory Visit: Payer: Self-pay | Admitting: Certified Nurse Midwife

## 2016-05-09 DIAGNOSIS — B379 Candidiasis, unspecified: Secondary | ICD-10-CM

## 2016-05-09 DIAGNOSIS — T3695XA Adverse effect of unspecified systemic antibiotic, initial encounter: Principal | ICD-10-CM

## 2016-05-09 LAB — IPS N GONORRHOEA AND CHLAMYDIA BY PCR

## 2016-05-09 LAB — WET PREP BY MOLECULAR PROBE
CANDIDA SPECIES: NEGATIVE
GARDNERELLA VAGINALIS: POSITIVE — AB
TRICHOMONAS VAG: NEGATIVE

## 2016-05-09 MED ORDER — METRONIDAZOLE 500 MG PO TABS: 500.0000 mg | ORAL_TABLET | Freq: Two times a day (BID) | ORAL | 0 refills | Status: DC

## 2016-05-09 MED ORDER — FLUCONAZOLE 150 MG PO TABS: 150.0000 mg | ORAL_TABLET | Freq: Once | ORAL | 0 refills | Status: AC

## 2016-05-09 NOTE — Progress Notes (Signed)
Encounter reviewed Jill Jertson, MD   

## 2016-05-11 ENCOUNTER — Institutional Professional Consult (permissible substitution): Payer: Self-pay | Admitting: Psychiatry

## 2016-06-08 ENCOUNTER — Institutional Professional Consult (permissible substitution): Payer: Self-pay | Admitting: Psychiatry

## 2016-06-28 ENCOUNTER — Encounter: Payer: Self-pay | Admitting: Certified Nurse Midwife

## 2016-06-28 ENCOUNTER — Ambulatory Visit (INDEPENDENT_AMBULATORY_CARE_PROVIDER_SITE_OTHER): Payer: Managed Care, Other (non HMO) | Admitting: Certified Nurse Midwife

## 2016-06-28 VITALS — BP 108/82 | HR 70 | Resp 16 | Ht 65.5 in | Wt 196.0 lb

## 2016-06-28 DIAGNOSIS — N898 Other specified noninflammatory disorders of vagina: Secondary | ICD-10-CM | POA: Diagnosis not present

## 2016-06-28 NOTE — Progress Notes (Signed)
41 y.o. Single African American female [phone] here with complaint of vaginal symptoms of  burning, and increase in discharge. Patient had laser hair removal one week ago. Symptoms started 5 days ago with rash and burning. Describes discharge as brown in color after sexual activity.. Denies new personal products or vaginal dryness. No STD concerns, but would like GC,Chlamydia. Had condom to break on the way out.[name] Urinary symptoms none, except when urine touches skin. . Contraception is condoms.  ROS Pertinent to HPI  O:Healthy female WDWN Affect: normal, orientation x 3  Exam: Abdomen:soft, non tender Lymph node: no enlargement or tenderness Pelvic exam: External genital: normal female, with slight edema and exudate noted on vulva bilateral. Slightly tender to touch, no lesion or blistering or rash noted BUS: negative Vagina: white milky discharge noted, non tender Affirm taken Cervix: normal, non tender, no CMT Uterus: normal, non tender Adnexa:normal, non tender, no masses or fullness   A:Normal pelvic exam R/O vaginal infection ? Localized reaction from laser   P:Discussed findings of ? Reaction to laser vs vaginal infection and etiology. Discussed Aveeno  sitz bath for comfort. Avoid moist clothes or pads for extended period of time. Avoid any lotions or creams to area, until labs back. Continue sitz bath as needed for comfort. Rv prn

## 2016-06-28 NOTE — Patient Instructions (Signed)
How to Take a Sitz Bath A sitz bath is a warm water bath that is taken while you are sitting down. The water should only come up to your hips and should cover your buttocks. Your health care provider may recommend a sitz bath to help you:  Clean the lower part of your body, including your genital area.  With itching.  With pain.  With sore muscles or muscles that tighten or spasm. How to take a sitz bath Take 3-4 sitz baths per day or as told by your health care provider. 1. Partially fill a bathtub with warm water. You will only need the water to be deep enough to cover your hips and buttocks when you are sitting in it. 2. If your health care provider told you to put medicine in the water, follow the directions exactly. 3. Sit in the water and open the tub drain a little. 4. Turn on the warm water again to keep the tub at the correct level. Keep the water running constantly. 5. Soak in the water for 15-20 minutes or as told by your health care provider. 6. After the sitz bath, pat the affected area dry first. Do not rub it. 7. Be careful when you stand up after the sitz bath because you may feel dizzy. Contact a health care provider if:  Your symptoms get worse. Do not continue with sitz baths if your symptoms get worse.  You have new symptoms. Do not continue with sitz baths until you talk with your health care provider. This information is not intended to replace advice given to you by your health care provider. Make sure you discuss any questions you have with your health care provider. Document Released: 12/17/2003 Document Revised: 08/24/2015 Document Reviewed: 03/24/2014 Elsevier Interactive Patient Education  2017 [employer].

## 2016-06-29 ENCOUNTER — Other Ambulatory Visit: Payer: Self-pay

## 2016-06-29 LAB — WET PREP BY MOLECULAR PROBE
CANDIDA SPECIES: DETECTED — AB
GARDNERELLA VAGINALIS: DETECTED — AB
Trichomonas vaginosis: NOT DETECTED

## 2016-06-29 LAB — GC/CHLAMYDIA PROBE AMP
CT PROBE, AMP APTIMA: NOT DETECTED
GC PROBE AMP APTIMA: NOT DETECTED

## 2016-06-29 MED ORDER — METRONIDAZOLE 500 MG PO TABS: 500.0000 mg | ORAL_TABLET | Freq: Two times a day (BID) | ORAL | 0 refills | Status: DC

## 2016-06-29 MED ORDER — TERCONAZOLE 0.4 % VA CREA: 1.0000 | TOPICAL_CREAM | Freq: Every day | VAGINAL | 0 refills | Status: DC

## 2016-07-01 NOTE — Progress Notes (Signed)
Encounter reviewed Dewane Timson, MD   

## 2016-07-15 ENCOUNTER — Encounter: Payer: Self-pay | Admitting: Certified Nurse Midwife

## 2016-07-17 ENCOUNTER — Telehealth: Payer: Self-pay | Admitting: *Deleted

## 2016-07-17 ENCOUNTER — Encounter: Payer: Self-pay | Admitting: Obstetrics and Gynecology

## 2016-07-17 NOTE — Telephone Encounter (Signed)
Spoke with patient, advised as seen below per Tiffany [name], CNM. Patient states she has used Aveeno with no relief, itching is out of control, would like OV. Patient scheduled for OV on 4/11 at 9:30am with Tiffany [name], CNM. Patient is agreeable to date and time.  Routing to provider for final review. Patient is agreeable to disposition. Will close encounter.

## 2016-07-17 NOTE — Telephone Encounter (Signed)
See telephone encounter dated 07/17/16.

## 2016-07-17 NOTE — Telephone Encounter (Signed)
Spoke with patient. Patient states she was treated for BV and yeast with Flagyl and Terazol7. Patient reports completing medication around 4/5, but symptoms never completely resolved. Patient states she still has vaginal itching and white clumpy discharge, no odor, no pain or urinary complaints. Patient requesting Diflucan. Patient states she always gets yeast infections when taking Flagyl, has used Diflucan in past. Advised patient would review with Melvia Heaps, CNM and return call with recommendations, patient is agreeable.  Melvia Heaps, CNM -please advise on Diflucan?  Cc: Dr. Quincy Simmonds

## 2016-07-17 NOTE — Telephone Encounter (Signed)
Left message to call [name] at [phone].    From Tiffany [name] To Tiffany [name], CNM Sent 07/15/2016 9:59 PM  Hi Dr. Hollice Espy, I am writing you again because I have another yeast infection. I'm not sure if taking the metronidazole started had sonething to do with the yeast not going completely away or not. As normally when I take that medication it causes yeast infections for me.    I'd like to know if I can get two doses of Diflucan prescribed for me as when Dr. Quincy Simmonds prescribed she would always give me two to take. I would take one on day one and one on day three to ensure the yeast was gone.    I can be reached at [phone] for further discussion.   Thx,  m   From [name] To [name], MD Sent 07/17/2016 10:44 AM  Hi Dr. Quincy Simmonds, I sent and email to [name] two days ago, I am writing you again because I have another yeast infection. I'm not sure if taking the metronidazole started had sonething to do with the yeast not going completely away or not. As normally when I take that medication it causes yeast infections for me.   I'd like to know if I can get two doses of Diflucan prescribed for me as when Dr. Quincy Simmonds (you) prescribed she would always give me two to take. I would take one on day one and one on day three to ensure the yeast was gone.  [name] has not responded to date therefore I thought I'd contact you directly.   I can be reached at [phone] for further discussion.   Thx,  m

## 2016-07-17 NOTE — Telephone Encounter (Signed)
She was treated for both at the same time and it usually takes a week to treat infection and another week for it to totally resolved. Would not retreat at this point. Aveeno or baking soda sitz bath to help with discharge recommended.

## 2016-07-18 ENCOUNTER — Encounter: Payer: Self-pay | Admitting: Certified Nurse Midwife

## 2016-07-18 ENCOUNTER — Ambulatory Visit (INDEPENDENT_AMBULATORY_CARE_PROVIDER_SITE_OTHER): Payer: Managed Care, Other (non HMO) | Admitting: Certified Nurse Midwife

## 2016-07-18 VITALS — BP 118/78 | HR 64 | Resp 16 | Ht 65.5 in | Wt 193.0 lb

## 2016-07-18 DIAGNOSIS — N898 Other specified noninflammatory disorders of vagina: Secondary | ICD-10-CM

## 2016-07-18 NOTE — Progress Notes (Signed)
41 y.o. Single African American female [phone] here with complaint of vaginal symptoms of itching, and increase discharge. Describes discharge as clear, no odor.Onset of symptoms 3 days ago. Denies new personal products or vaginal dryness. No STD concerns. Urinary symptoms none . Patient was treated for Yeast vaginitis with Terazol & and BV with Flagyl on 06/30/15. Completed all medication as directed. Feels just did not clear. Not sexually since medication use. Does wear thongs.  Contraception is condoms.  ROS  Pertinent to HPI  O:Healthy female WDWN Affect: normal, orientation x 3  Exam:skin warm and dry Abdomen:soft, non tender Inguinal Lymph nodes: no enlargement or tenderness Pelvic exam: External genital: normal female, no scaling, redness or exudate BUS: negative Vagina: normal appearing physiological non odorous discharge noted. Ph: 4.0 ,Wet prep taken, Affirm taken Cervix: normal, non tender, no CMT Uterus: normal, non tender Adnexa:normal, non tender, no masses or fullness noted   Wet Prep results: no yeast or clue cells noted with KOH,Saline   A:Normal pelvic exam Negative wet prep in office Will send affirm   P:Discussed findings of negative wet prep in office and etiology. Discussed Aveeno or baking soda sitz bath for comfort. Avoid moist clothes extended period of time. If working out in gym clothes for long periods of time  Avoid thong use and sleep without underwear or loose shorts to see if this will improve symptoms. Consider probiotic use due to reoccurrence. Questions addressed. Lab Affirm  Rv prn

## 2016-07-19 ENCOUNTER — Other Ambulatory Visit: Payer: Self-pay | Admitting: Certified Nurse Midwife

## 2016-07-19 ENCOUNTER — Other Ambulatory Visit: Payer: Self-pay

## 2016-07-19 DIAGNOSIS — A499 Bacterial infection, unspecified: Secondary | ICD-10-CM

## 2016-07-19 DIAGNOSIS — B379 Candidiasis, unspecified: Secondary | ICD-10-CM

## 2016-07-19 LAB — WET PREP BY MOLECULAR PROBE
Candida species: DETECTED — AB
GARDNERELLA VAGINALIS: DETECTED — AB
Trichomonas vaginosis: NOT DETECTED

## 2016-07-19 MED ORDER — FLUCONAZOLE 150 MG PO TABS: ORAL_TABLET | ORAL | 0 refills | Status: DC

## 2016-07-19 MED ORDER — CLINDAMYCIN PHOSPHATE 2 % VA CREA: TOPICAL_CREAM | VAGINAL | 0 refills | Status: DC

## 2016-07-28 NOTE — Progress Notes (Signed)
Encounter reviewed Alea Ryer, MD   

## 2016-08-02 ENCOUNTER — Other Ambulatory Visit: Payer: Self-pay | Admitting: Obstetrics and Gynecology

## 2016-08-02 ENCOUNTER — Telehealth: Payer: Self-pay

## 2016-08-02 DIAGNOSIS — Z1231 Encounter for screening mammogram for malignant neoplasm of breast: Secondary | ICD-10-CM

## 2016-08-02 NOTE — Telephone Encounter (Signed)
Spoke with patient regarding scheduling screening mammogram as she is also due to have a bilateral breast MRI w wo contrast. Patient would like assistance with scheduling screening mammogram at the [facility]. Advised will contact [facility] to schedule screening mammogram and will return call with appointment date and time.   Spoke with the [facility]. Per the [facility] this needs to be cycle dependent so that they can schedule her MRI after her menses. Spoke with patient who states she is due to start her menses on 4/28 or 4/29. Appointment for screening mammogram scheduled for 08/06/2016 at 9:10 am. Patient is agreeable to date and time. Patient is aware if her menses does not start she will need to call the [facility] to reschedule appointment.  Cc: Lerry Liner  Routing to provider for final review. Patient agreeable to disposition. Will close encounter.

## 2016-08-06 ENCOUNTER — Ambulatory Visit: Payer: Self-pay

## 2016-08-14 ENCOUNTER — Encounter: Payer: Self-pay | Admitting: Obstetrics and Gynecology

## 2016-09-03 ENCOUNTER — Encounter: Payer: Self-pay | Admitting: Certified Nurse Midwife

## 2016-09-04 ENCOUNTER — Telehealth: Payer: Self-pay

## 2016-09-04 NOTE — Telephone Encounter (Signed)
Telephone encounter created to review with Melvia Heaps CNM.

## 2016-09-04 NOTE — Telephone Encounter (Signed)
MyChart message as seen below sent to patient.  Naia,  My name is Verline Lema. I am one of the triage nurses in the office with Melvia Heaps CNM. I am assisting to answer her MyChart messages today. Melvia Heaps CNM has reviewed your message and recommends that you come in to be seen in the office for further evaluation. It is important to distinguish if the symptoms you are having are from a reaction or from yeast. We want to ensure we are treating you properly. Please contact the office at your convenience to schedule an appointment. Or if you have a date and time in mind I can look for you to schedule via MyChart.  Sincerely,  Reesa Chew, RN

## 2016-09-04 NOTE — Telephone Encounter (Signed)
Non-Urgent Medical Question  Message [phone]  From Tiffany [name] To [name], CNM Sent 09/03/2016 11:42 PM  Hi [name], I would like to request another prescription for Diflucan as I have another yeast infection. The infection came from condom usage as I and allergic to latex condoms. Please let me know if I could have 2 Diflucan pills and a cream for the external itch.  I have a discharge that is white with no smell.   Thank you and I can be reached at [phone].   KR,  M   Responsible Party   Pool - [facility] Clinical Pool Message taken by Gwendlyn Deutscher, RN on 09/04/2016 12:31 PM  No actions have been taken on this message.   Routing to [employer] CNM for review.

## 2016-09-04 NOTE — Telephone Encounter (Signed)
I think she should come in

## 2016-09-05 ENCOUNTER — Encounter: Payer: Self-pay | Admitting: Certified Nurse Midwife

## 2016-09-05 ENCOUNTER — Ambulatory Visit (INDEPENDENT_AMBULATORY_CARE_PROVIDER_SITE_OTHER): Payer: Managed Care, Other (non HMO) | Admitting: Certified Nurse Midwife

## 2016-09-05 VITALS — BP 102/70 | HR 70 | Resp 16 | Ht 65.5 in | Wt 192.0 lb

## 2016-09-05 DIAGNOSIS — B373 Candidiasis of vulva and vagina: Secondary | ICD-10-CM | POA: Diagnosis not present

## 2016-09-05 DIAGNOSIS — B3731 Acute candidiasis of vulva and vagina: Secondary | ICD-10-CM

## 2016-09-05 MED ORDER — NYSTATIN 100000 UNIT/GM EX CREA: 1.0000 "application " | TOPICAL_CREAM | Freq: Two times a day (BID) | CUTANEOUS | 0 refills | Status: DC

## 2016-09-05 MED ORDER — FLUCONAZOLE 150 MG PO TABS: 150.0000 mg | ORAL_TABLET | Freq: Once | ORAL | 0 refills | Status: AC

## 2016-09-05 NOTE — Telephone Encounter (Signed)
Patient was seen in office today at 11 am with Melvia Heaps CNM.  Routing to provider for final review. Patient agreeable to disposition. Will close encounter.

## 2016-09-05 NOTE — Progress Notes (Signed)
41 y.o. Single African American female [phone] here with complaint of vaginal symptoms of itching,  and increase discharge. Describes discharge as watery with odor. Used latex condoms and she has problems with them..Onset of symptoms 2-3 days ago. Denies new personal products. No STD concerns. Urinary symptoms none . Contraception is condoms.  ROS  Pertinent to HPI  O:Healthy female WDWN Affect: normal, orientation x 3  Exam:skin: warm and dry Abdomen: soft no masses  inguinal Lymph nodes: no enlargement or tenderness Pelvic exam: External genital: normal female, with slight scaling and exudate, wet prep taken BUS: negative Vagina: white slightly thick non odorous discharge noted. Ph:4.0   ,Wet prep taken,  Cervix: normal, non tender, no CMT Uterus: normal, non tender Adnexa:normal, non tender, no masses or fullness noted   Wet Prep results: KOH,Saline + for yeast vaginal and vulva   A:Normal pelvic exam Yeast vaginitis/vulvitis Latex condom mild allergy   P:Discussed findings of yeast vaginitis/vulvitis and etiology. Discussed Aveeno or baking soda sitz bath for comfort. Discussed carrying the condoms that are non latex, so possible severe reaction to use. Patient will plan to try. Questions addressed.   Rx: Diflucan see order with instructions Rx Nystatin cream see order with instructions  Rv prn

## 2016-09-05 NOTE — Patient Instructions (Signed)

## 2016-10-13 ENCOUNTER — Encounter (HOSPITAL_COMMUNITY): Payer: Self-pay

## 2016-10-13 ENCOUNTER — Emergency Department (HOSPITAL_COMMUNITY)
Admission: EM | Admit: 2016-10-13 | Discharge: 2016-10-14 | Disposition: A | Discharge status: Home / Self Care | Payer: No Typology Code available for payment source | Attending: Emergency Medicine | Admitting: Emergency Medicine

## 2016-10-13 DIAGNOSIS — Z91040 Latex allergy status: Secondary | ICD-10-CM | POA: Insufficient documentation

## 2016-10-13 DIAGNOSIS — R0789 Other chest pain: Secondary | ICD-10-CM | POA: Diagnosis not present

## 2016-10-13 DIAGNOSIS — R112 Nausea with vomiting, unspecified: Secondary | ICD-10-CM | POA: Insufficient documentation

## 2016-10-13 DIAGNOSIS — M549 Dorsalgia, unspecified: Secondary | ICD-10-CM | POA: Diagnosis not present

## 2016-10-13 DIAGNOSIS — M542 Cervicalgia: Secondary | ICD-10-CM | POA: Diagnosis present

## 2016-10-13 DIAGNOSIS — R51 Headache: Secondary | ICD-10-CM | POA: Insufficient documentation

## 2016-10-13 DIAGNOSIS — Z79899 Other long term (current) drug therapy: Secondary | ICD-10-CM | POA: Insufficient documentation
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
## 2016-10-13 NOTE — ED Notes (Signed)
ED Provider at bedside. 

## 2016-10-14 ENCOUNTER — Emergency Department (HOSPITAL_COMMUNITY): Payer: No Typology Code available for payment source

## 2016-10-14 LAB — POC URINE PREG, ED: Preg Test, Ur: NEGATIVE

## 2016-10-14 MED ORDER — IBUPROFEN 600 MG PO TABS: 600.0000 mg | ORAL_TABLET | Freq: Four times a day (QID) | ORAL | 0 refills | Status: DC | PRN

## 2016-10-14 MED ORDER — ACETAMINOPHEN 500 MG PO TABS: 500.0000 mg | ORAL_TABLET | Freq: Four times a day (QID) | ORAL | 0 refills | Status: DC | PRN

## 2016-10-14 MED ORDER — METHOCARBAMOL 500 MG PO TABS: 500.0000 mg | ORAL_TABLET | Freq: Two times a day (BID) | ORAL | 0 refills | Status: DC
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
## 2016-10-14 NOTE — ED Notes (Signed)
Patient transported to CT 

## 2016-10-14 NOTE — ED Notes (Signed)
ED Provider at bedside. 

## 2016-11-26 ENCOUNTER — Ambulatory Visit
Admission: RE | Admit: 2016-11-26 | Discharge: 2016-11-26 | Disposition: A | Discharge status: Home / Self Care | Payer: 59 | Source: Ambulatory Visit | Attending: Obstetrics and Gynecology | Admitting: Obstetrics and Gynecology

## 2016-11-26 ENCOUNTER — Other Ambulatory Visit: Payer: Self-pay | Admitting: Obstetrics and Gynecology

## 2016-11-26 DIAGNOSIS — Z1231 Encounter for screening mammogram for malignant neoplasm of breast: Secondary | ICD-10-CM

## 2016-11-28 DIAGNOSIS — R002 Palpitations: Secondary | ICD-10-CM | POA: Insufficient documentation

## 2016-11-28 DIAGNOSIS — E559 Vitamin D deficiency, unspecified: Secondary | ICD-10-CM | POA: Insufficient documentation

## 2016-12-06 ENCOUNTER — Other Ambulatory Visit: Payer: Self-pay

## 2016-12-06 ENCOUNTER — Ambulatory Visit: Payer: Self-pay

## 2017-02-05 ENCOUNTER — Encounter: Payer: Self-pay | Admitting: Certified Nurse Midwife

## 2017-04-30 ENCOUNTER — Encounter: Payer: Self-pay | Admitting: Obstetrics and Gynecology

## 2017-04-30 ENCOUNTER — Other Ambulatory Visit: Payer: Self-pay

## 2017-04-30 ENCOUNTER — Ambulatory Visit: Payer: Managed Care, Other (non HMO) | Admitting: Obstetrics and Gynecology

## 2017-04-30 VITALS — BP 116/66 | HR 84 | Resp 14 | Wt 191.0 lb

## 2017-04-30 DIAGNOSIS — N912 Amenorrhea, unspecified: Secondary | ICD-10-CM

## 2017-04-30 DIAGNOSIS — R5383 Other fatigue: Secondary | ICD-10-CM | POA: Diagnosis not present

## 2017-04-30 DIAGNOSIS — Z113 Encounter for screening for infections with a predominantly sexual mode of transmission: Secondary | ICD-10-CM | POA: Diagnosis not present

## 2017-04-30 DIAGNOSIS — N760 Acute vaginitis: Secondary | ICD-10-CM

## 2017-04-30 LAB — POCT URINE PREGNANCY: Preg Test, Ur: NEGATIVE

## 2017-04-30 MED ORDER — METRONIDAZOLE 500 MG PO TABS: 500.0000 mg | ORAL_TABLET | Freq: Two times a day (BID) | ORAL | 0 refills | Status: DC

## 2017-04-30 MED ORDER — MEDROXYPROGESTERONE ACETATE 5 MG PO TABS: 5.0000 mg | ORAL_TABLET | Freq: Every day | ORAL | 0 refills | Status: DC

## 2017-04-30 MED ORDER — BETAMETHASONE VALERATE 0.1 % EX OINT: TOPICAL_OINTMENT | CUTANEOUS | 0 refills | Status: DC

## 2017-04-30 NOTE — Progress Notes (Signed)
GYNECOLOGY  VISIT   HPI: 42 y.o.   Single  African American  female   [phone] with No LMP recorded (lmp unknown).   here for   Vaginal discharge, itching, and odor for 3 days per patient. The d/c is white and thick initially, now now brownish. Itching is bad, odor is mild.  LMP about 3 months ago, prior to that cycles were monthly x 7 days. Saturating 2-3 tampons a day. In 2012 she didn't bleed for a year, then got pregnant in 2013.  C/O fatigue, no other thyroid c/o. No galactorrhea.  Sexually active, same partner x 2 years. Using w/d for contraception. Not trying to get pregnant, but okay if she does. Last sexually active 3 days ago.   She has 6 kids, ages range from 4-28, has 2 grandchildren  GYNECOLOGIC HISTORY: No LMP recorded (lmp unknown). Contraception:none Menopausal hormone therapy: none        OB History    Gravida Para Term Preterm AB Living   12 6 6   6 6    SAB TAB Ectopic Multiple Live Births   6       6         Patient Active Problem List   Diagnosis Date Noted  . Abdominal bloating 12/27/2015  . Abnormal weight gain 12/23/2015  . Peripheral edema 11/22/2015  . Atypical chest pain 11/10/2015  . Gastric polyps 11/10/2015  . Hypokalemia 08/15/2015  . Seasonal allergic rhinitis due to pollen 07/05/2015  . Sickle cell trait ([place]) 07/05/2015  . Genetic testing 06/13/2015  . Family history of breast cancer 06/02/2015  . Family history of malignant neoplasm of ovary 06/02/2015  . BV (bacterial vaginosis) 02/18/2015  . Hemorrhoids 02/18/2015  . Fibrositis 11/30/2014  . Left leg DVT ([place]) 11/18/2014  . History of DVT of lower extremity 11/18/2014  . History of laparoscopic adjustable gastric banding 01/29/2012  . Depression 12/19/2011  . Fibromyalgia 12/19/2011  . Arthritis 12/19/2011  . Irritable bowel syndrome 12/19/2011  . Gastroesophageal reflux disease 12/19/2011  . Uterine leiomyoma 12/19/2011    Past Medical History:  Diagnosis Date  . Abnormal Pap  smear 1997   LAST PAP 10/2010  . Allergy    SEASONAL  . Anemia    CHRONIC  . 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 Arthritis   . Blood dyscrasia    SICKLE CELL TRAIT  . Blood transfusion without reported diagnosis 2005   after vaginal delivery  . Breast mass in female 2003  . Broken foot 03/16/2016   right  . Bruises easily   . BV (bacterial vaginosis)   . Cyst of ovary 2003  . Depression 2003  . DUB (dysfunctional uterine bleeding) 2003  . DVT, lower extremity ([place]) 10/2014   left leg  . Fibroid    LONG TERM DX  . Fibromyalgia 2004  . Fibrositis   . GERD (gastroesophageal reflux disease) 2007  . H/O amenorrhea 2006  . H/O menorrhagia 2011  . H/O sickle cell trait   . Heart murmur 2007  . History of abnormal cervical Pap smear 2003   had colposcopy and cryo.  Follow up paps are normal.  . Hx: UTI (urinary tract infection) 2003  . Hypertension 2005   RESOLVED WITH WT LOSS  . IBD (inflammatory bowel disease)   . IBS (irritable bowel syndrome)   . Infection    OCC YEAST  . Low back pain 2003  . Menses, irregular 2003  . Migraine   .  Obese 2003  . Pulmonary embolism ([place]) 10/2014  . Sacroiliitis ([place])   . Sickle-cell trait ([place])   . Trichomonas   . Uterine leiomyoma   . Vaginitis and vulvovaginitis 2004  . Yeast vaginitis 04/24/2010    Past Surgical History:  Procedure Laterality Date  . AUGMENTATION MAMMAPLASTY  7858   Silicone implants  . broken ankle Right   . CERVICAL CONE BIOPSY  1997  . COSMETIC SURGERY  2012   thigh lift  . CYSTECTOMY Right    obtain date from patient, was not on history form dated 06/16/10--benign cyst removed Rt.ovary  . DILATION AND CURETTAGE OF UTERUS    . LAPAROSCOPIC GASTRIC BANDING  05/20/2007  . WISDOM TOOTH EXTRACTION      Current Outpatient Medications  Medication Sig Dispense Refill  . acetaminophen (TYLENOL) 500 MG tablet Take 1 tablet (500 mg total) by mouth every 6 (six) hours as needed. 30 tablet 0  . ALPRAZolam (XANAX  XR) 1 MG 24 hr tablet Take 1 mg by mouth as needed for anxiety.    . ALPRAZolam (XANAX) 1 MG tablet Take 1 mg by mouth at bedtime as needed for anxiety.    [name] amphetamine-dextroamphetamine (ADDERALL) 20 MG tablet     . ANUCORT-HC 25 MG suppository as needed.  10  . ARIPiprazole (ABILIFY) 15 MG tablet     . cetirizine (ZYRTEC) 10 MG tablet Take 10 mg by mouth.    . fluticasone (FLONASE) 50 MCG/ACT nasal spray 1 spray by Each Nare route Three (3) times a day as needed.    . furosemide (LASIX) 20 MG tablet TK 1 T PO BID  11  . lubiprostone (AMITIZA) 24 MCG capsule Take 1 capsule (24 mcg total) by mouth daily with breakfast. 30 capsule 5  . MYDAYIS 50 MG CP24 TK ONE C PO QAM  0  . nystatin cream (MYCOSTATIN) Apply 1 application topically 2 (two) times daily. Apply to affected area BID for up to 7 days. 30 g 0  . pantoprazole (PROTONIX) 40 MG tablet TK 1 T PO  QD  2  . Vitamin D, Ergocalciferol, (DRISDOL) 50000 UNITS CAPS capsule Take 50,000 Units by mouth 2 (two) times a week. Takes on sun and wed    . zolpidem (AMBIEN) 10 MG tablet Take 10 mg by mouth at bedtime.     No current facility-administered medications for this visit.      ALLERGIES: Flexeril [cyclobenzaprine hcl]; Latex; and Other  Family History  Problem Relation Age of Onset  . Diabetes Mother   . Arthritis Mother   . Hyperlipidemia Mother   . Other Mother        VARICOSE VEINS; DECREASED HEART RATE; +Hysterectomy for unspecified reason  . Fibromyalgia Mother   . Colon polyps Mother        unspecified number  . Depression Mother   . Heart disease Father   . Hypertension Father   . Alcohol abuse Father   . Drug abuse Father   . Stroke Father   . Ovarian cancer Sister        dx. <43y  . Cervical cancer Sister 18  . Multiple sclerosis Brother   . Thyroid disease Brother        hypothyroidism  . Lung cancer Maternal Grandmother 74       secondhand smoke  . Depression Maternal Grandmother   . Miscarriages / Stillbirths  Maternal Grandmother   . Birth defects Maternal Aunt  EXTRA DIGITS; MUSCULAR DISEASE  . Early death Maternal Aunt 70  . COPD Maternal Grandfather   . Stroke Maternal Grandfather   . Ovarian cancer Maternal Aunt 75       Dec.age 10 Ovarian CA  . Early death Maternal Aunt        d. three days after birth; unknown cause  . Breast cancer Maternal Aunt 83  . Colon polyps Maternal Aunt        unspecified number  . Liver disease Paternal Aunt   . Ovarian cancer Maternal Aunt 37  . Depression Maternal Aunt   . Heart attack Paternal Uncle        in mid-50s  . Alcohol abuse Maternal Uncle   . Breast cancer Cousin        maternal    Social History   Socioeconomic History  . Marital status: Single    Spouse name: Not on file  . Number of children: 5  . Years of education: 47  . Highest education level: Not on file  Social Needs  . Financial resource strain: Not on file  . Food insecurity - worry: Not on file  . Food insecurity - inability: Not on file  . Transportation needs - medical: Not on file  . Transportation needs - non-medical: Not on file  Occupational History  . Occupation: CLINICAL RESEARCH  Tobacco Use  . Smoking status: Never Smoker  . Smokeless tobacco: Never Used  Substance and Sexual Activity  . Alcohol use: Yes    Alcohol/week: 0.6 oz    Types: 1 Glasses of wine per week  . Drug use: No  . Sexual activity: Yes    Partners: Male    Birth control/protection: Condom  Other Topics Concern  . Not on file  Social History Narrative  . Not on file    Review of Systems  Constitutional: Negative.   HENT:       Sinusitis  Eyes: Negative.   Respiratory: Negative.   Cardiovascular: Negative.   Gastrointestinal: Negative.   Genitourinary:       Abnormal discharge Vaginal itching  Musculoskeletal: Negative.   Skin: Negative.   Neurological: Negative.   Endo/Heme/Allergies: Negative.   Psychiatric/Behavioral: Negative.     PHYSICAL EXAMINATION:     BP 116/66 (BP Location: Right Arm, Patient Position: Sitting, Cuff Size: Large)   Pulse 84   Resp 14   Wt 191 lb (86.6 kg)   LMP  (LMP Unknown)   BMI 31.30 kg/m     General appearance: alert, cooperative and appears stated age Neck: no adenopathy, supple, symmetrical, trachea midline and thyroid normal to inspection and palpation   Pelvic: External genitalia:  no lesions, + erythema              Urethra:  normal appearing urethra with no masses, tenderness or lesions              Bartholins and Skenes: normal                 Vagina: normal appearing vagina with normal color and discharge, no lesions              Cervix:no lesions  Chaperone was present for exam.  Wet prep: ++ clue, no trich, few wbc KOH: no yeast PH: 5   ASSESSMENT Amenorrhea for the last 3 months, has done this in the past (years ago) Fatigue Vaginitis, has BV on slides, some symptoms c/w yeast Desires STD testing  PLAN CBC, TSH, Prolactin Negative UPT Use protection for 2 weeks, check a upt if negative take provera x 5 days F/U for annual exam in one month Recommend she be on a pre-natal vit Send affirm to r/o yeast STD testing   An After Visit Summary was printed and given to the patient.  ~25 minutes face to face time of which over 50% was spent in counseling.

## 2017-04-30 NOTE — Patient Instructions (Addendum)
Use protection for 2 weeks or abstain from intercourse, the check a home pregnancy test. If negative start the provera, you should get a cycle within 1-2 weeks of stopping it.  Bacterial Vaginosis Bacterial vaginosis is a vaginal infection that occurs when the normal balance of bacteria in the vagina is disrupted. It results from an overgrowth of certain bacteria. This is the most common vaginal infection among women ages 12-44. Because bacterial vaginosis increases your risk for STIs (sexually transmitted infections), getting treated can help reduce your risk for chlamydia, gonorrhea, herpes, and HIV (human immunodeficiency virus). Treatment is also important for preventing complications in pregnant women, because this condition can cause an early (premature) delivery. What are the causes? This condition is caused by an increase in harmful bacteria that are normally present in small amounts in the vagina. However, the reason that the condition develops is not fully understood. What increases the risk? The following factors may make you more likely to develop this condition:  Having a new sexual partner or multiple sexual partners.  Having unprotected sex.  Douching.  Having an intrauterine device (IUD).  Smoking.  Drug and alcohol abuse.  Taking certain antibiotic medicines.  Being pregnant.  You cannot get bacterial vaginosis from toilet seats, bedding, swimming pools, or contact with objects around you. What are the signs or symptoms? Symptoms of this condition include:  Grey or white vaginal discharge. The discharge can also be watery or foamy.  A fish-like odor with discharge, especially after sexual intercourse or during menstruation.  Itching in and around the vagina.  Burning or pain with urination.  Some women with bacterial vaginosis have no signs or symptoms. How is this diagnosed? This condition is diagnosed based on:  Your medical history.  A physical exam of  the vagina.  Testing a sample of vaginal fluid under a microscope to look for a large amount of bad bacteria or abnormal cells. Your health care provider may use a cotton swab or a small wooden spatula to collect the sample.  How is this treated? This condition is treated with antibiotics. These may be given as a pill, a vaginal cream, or a medicine that is put into the vagina (suppository). If the condition comes back after treatment, a second round of antibiotics may be needed. Follow these instructions at home: Medicines  Take over-the-counter and prescription medicines only as told by your health care provider.  Take or use your antibiotic as told by your health care provider. Do not stop taking or using the antibiotic even if you start to feel better. General instructions  If you have a female sexual partner, tell her that you have a vaginal infection. She should see her health care provider and be treated if she has symptoms. If you have a female sexual partner, he does not need treatment.  During treatment: ? Avoid sexual activity until you finish treatment. ? Do not douche. ? Avoid alcohol as directed by your health care provider. ? Avoid breastfeeding as directed by your health care provider.  Drink enough water and fluids to keep your urine clear or pale yellow.  Keep the area around your vagina and rectum clean. ? Wash the area daily with warm water. ? Wipe yourself from front to back after using the toilet.  Keep all follow-up visits as told by your health care provider. This is important. How is this prevented?  Do not douche.  Wash the outside of your vagina with warm water only.  Use  protection when having sex. This includes latex condoms and dental dams.  Limit how many sexual partners you have. To help prevent bacterial vaginosis, it is best to have sex with just one partner (monogamous).  Make sure you and your sexual partner are tested for STIs.  Wear cotton  or cotton-lined underwear.  Avoid wearing tight pants and pantyhose, especially during summer.  Limit the amount of alcohol that you drink.  Do not use any products that contain nicotine or tobacco, such as cigarettes and e-cigarettes. If you need help quitting, ask your health care provider.  Do not use illegal drugs. Where to find more information:  Centers for Disease Control and Prevention: AppraiserFraud.fi  American Sexual Health Association (ASHA): www.ashastd.org  U.S. Department of Health and Financial controller, Office on Women's Health: DustingSprays.pl or SecuritiesCard.it Contact a health care provider if:  Your symptoms do not improve, even after treatment.  You have more discharge or pain when urinating.  You have a fever.  You have pain in your abdomen.  You have pain during sex.  You have vaginal bleeding between periods. Summary  Bacterial vaginosis is a vaginal infection that occurs when the normal balance of bacteria in the vagina is disrupted.  Because bacterial vaginosis increases your risk for STIs (sexually transmitted infections), getting treated can help reduce your risk for chlamydia, gonorrhea, herpes, and HIV (human immunodeficiency virus). Treatment is also important for preventing complications in pregnant women, because the condition can cause an early (premature) delivery.  This condition is treated with antibiotic medicines. These may be given as a pill, a vaginal cream, or a medicine that is put into the vagina (suppository). This information is not intended to replace advice given to you by your health care provider. Make sure you discuss any questions you have with your health care provider. Document Released: 03/26/2005 Document Revised: 07/30/2016 Document Reviewed: 12/10/2015 Elsevier Interactive Patient Education  [employer].

## 2017-05-01 ENCOUNTER — Other Ambulatory Visit: Payer: Self-pay | Admitting: *Deleted

## 2017-05-01 LAB — GC/CHLAMYDIA PROBE AMP
Chlamydia trachomatis, NAA: NEGATIVE
Neisseria gonorrhoeae by PCR: NEGATIVE

## 2017-05-01 LAB — CBC
HEMOGLOBIN: 11.4 g/dL (ref 11.1–15.9)
Hematocrit: 33.6 % — ABNORMAL LOW (ref 34.0–46.6)
MCH: 26.8 pg (ref 26.6–33.0)
MCHC: 33.9 g/dL (ref 31.5–35.7)
MCV: 79 fL (ref 79–97)
Platelets: 356 10*3/uL (ref 150–379)
RBC: 4.25 x10E6/uL (ref 3.77–5.28)
RDW: 16.7 % — AB (ref 12.3–15.4)
WBC: 4.4 10*3/uL (ref 3.4–10.8)

## 2017-05-01 LAB — PROLACTIN: PROLACTIN: 14.7 ng/mL (ref 4.8–23.3)

## 2017-05-01 LAB — HEP, RPR, HIV PANEL
HIV SCREEN 4TH GENERATION: NONREACTIVE
Hepatitis B Surface Ag: NEGATIVE
RPR Ser Ql: NONREACTIVE

## 2017-05-01 LAB — TSH: TSH: 2.2 u[IU]/mL (ref 0.450–4.500)

## 2017-05-01 LAB — VAGINITIS/VAGINOSIS, DNA PROBE
Candida Species: POSITIVE — AB
Gardnerella vaginalis: POSITIVE — AB
TRICHOMONAS VAG: NEGATIVE

## 2017-05-01 LAB — HEPATITIS C ANTIBODY

## 2017-05-01 MED ORDER — FLUCONAZOLE 150 MG PO TABS: ORAL_TABLET | ORAL | 0 refills | Status: DC

## 2017-05-03 LAB — SPECIMEN STATUS REPORT

## 2017-05-03 LAB — FERRITIN: Ferritin: 16 ng/mL (ref 15–150)

## 2017-05-08 ENCOUNTER — Telehealth: Payer: Self-pay | Admitting: Obstetrics and Gynecology

## 2017-05-08 MED ORDER — FLUCONAZOLE 150 MG PO TABS: 150.0000 mg | ORAL_TABLET | Freq: Once | ORAL | 0 refills | Status: AC

## 2017-05-08 NOTE — Telephone Encounter (Signed)
Spoke with patient, advised diflucan po x1 sent to pharmacy on file. Patient verbalizes understanding and is agreeable. Will close encounter.

## 2017-05-08 NOTE — Telephone Encounter (Signed)
Yes, she can take one more diflucan

## 2017-05-08 NOTE — Telephone Encounter (Signed)
Spoke with patient. Patient states that she was treated for BV and yeast with Flagyl and Diflucan. Took both Diflucan before she finished the Flagyl. Took 3 days of Flagyl after taking Diflucan. Reports she is now having thick white vaginal discharge and itching again. Asking if she can have 1 dose of Diflucan for yeast symptoms. Advised will review with Dr.Jertson and return call.

## 2017-05-08 NOTE — Telephone Encounter (Signed)
Patient called requesting to speak with the nurse about her "symptoms of a yeast infection have not gone away" after taking all the prescribed medication. Patient declined to schedule an appointment.

## 2017-06-06 ENCOUNTER — Ambulatory Visit (INDEPENDENT_AMBULATORY_CARE_PROVIDER_SITE_OTHER): Payer: Managed Care, Other (non HMO)

## 2017-06-06 ENCOUNTER — Ambulatory Visit (HOSPITAL_COMMUNITY)
Admission: EM | Admit: 2017-06-06 | Discharge: 2017-06-06 | Disposition: A | Discharge status: Home / Self Care | Payer: Managed Care, Other (non HMO) | Attending: Emergency Medicine | Admitting: Emergency Medicine

## 2017-06-06 ENCOUNTER — Encounter (HOSPITAL_COMMUNITY): Payer: Self-pay | Admitting: Family Medicine

## 2017-06-06 DIAGNOSIS — Z3202 Encounter for pregnancy test, result negative: Secondary | ICD-10-CM | POA: Diagnosis not present

## 2017-06-06 DIAGNOSIS — J22 Unspecified acute lower respiratory infection: Secondary | ICD-10-CM | POA: Diagnosis not present

## 2017-06-06 LAB — POCT PREGNANCY, URINE: PREG TEST UR: NEGATIVE

## 2017-06-06 MED ORDER — ONDANSETRON 4 MG PO TBDP
4.0000 mg | ORAL_TABLET | Freq: Once | ORAL | Status: AC
  Administered 2017-06-06: 4 mg via ORAL

## 2017-06-06 MED ORDER — ONDANSETRON 4 MG PO TBDP
ORAL_TABLET | ORAL | Status: AC
  Filled 2017-06-06: qty 1

## 2017-06-06 MED ORDER — ONDANSETRON HCL 4 MG PO TABS: 4.0000 mg | ORAL_TABLET | Freq: Three times a day (TID) | ORAL | 0 refills | Status: DC | PRN

## 2017-06-06 MED ORDER — AZITHROMYCIN 250 MG PO TABS: ORAL_TABLET | ORAL | 0 refills | Status: AC

## 2017-06-06 MED ORDER — FLUCONAZOLE 200 MG PO TABS: 200.0000 mg | ORAL_TABLET | Freq: Once | ORAL | 0 refills | Status: AC

## 2017-06-06 NOTE — ED Triage Notes (Addendum)
Pt here for cough, chest pain and SOB. Her PCP wants her to have a chest x ray r/o PNA. She tested positive for the flu and has finished a round of tamiflu.

## 2017-06-06 NOTE — Discharge Instructions (Signed)
Push fluids to ensure adequate hydration and keep secretions thin.  Tylenol and/or ibuprofen as needed for pain or fevers.  Complete course of antibiotics.  Zofran as needed for nausea or vomiting. If symptoms worsen or do not improve in the next week to return to be seen or to follow up with PCP.   If you develop worsening of chest pain, shortness of breath, palpitations, difficulty breathing, or otherwise worsening please go to ER for further evaluation.

## 2017-06-06 NOTE — ED Provider Notes (Signed)
[place]    CSN: [id number] Arrival date & time: 06/06/17  1002     History   Chief Complaint Chief Complaint  Patient presents with  . Cough    HPI Tiffany [name] is a 42 y.o. female.   Tiffany [name] presents with complaints of productive cough, intermittent chest pain, sore throat, upper back pain, shortness of breath  with activity. She was treated on 2/19 with tamiflu after swabbing positive for influenza. Completed course and improved for approximately 2 days before worsening of symptoms. Still with cough and congestion. No known fevers. Denies ear pain. Nausea and vomiting, has vomited 4 times today. Without blood to vomit or stools. Has not taken any medications for her symptoms. History of DVT to left leg, [place] was completed 2/19 and was negative for dvt. Hx gerd, allergies, anemia, anxiety. She does not smoke, is not on any birth control, no recent travel. Denies any current chest pain.    ROS per HPI.         Past Medical History:  Diagnosis Date  . Abnormal Pap smear 1997   LAST PAP 10/2010  . Allergy    SEASONAL  . Anemia    CHRONIC  . 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 Arthritis   . Blood dyscrasia    SICKLE CELL TRAIT  . Blood transfusion without reported diagnosis 2005   after vaginal delivery  . Breast mass in female 2003  . Broken foot 03/16/2016   right  . Bruises easily   . BV (bacterial vaginosis)   . Cyst of ovary 2003  . Depression 2003  . DUB (dysfunctional uterine bleeding) 2003  . DVT, lower extremity ([place]) 10/2014   left leg  . Fibroid    LONG TERM DX  . Fibromyalgia 2004  . Fibrositis   . GERD (gastroesophageal reflux disease) 2007  . H/O amenorrhea 2006  . H/O menorrhagia 2011  . H/O sickle cell trait   . Heart murmur 2007  . History of abnormal cervical Pap smear 2003   had colposcopy and cryo.  Follow up paps are normal.  . Hx: UTI (urinary tract infection) 2003  . Hypertension 2005   RESOLVED WITH WT LOSS  . IBD  (inflammatory bowel disease)   . IBS (irritable bowel syndrome)   . Infection    OCC YEAST  . Low back pain 2003  . Menses, irregular 2003  . Migraine   . Obese 2003  . Pulmonary embolism ([place]) 10/2014  . Sacroiliitis ([place])   . Sickle-cell trait ([place])   . Trichomonas   . Uterine leiomyoma   . Vaginitis and vulvovaginitis 2004  . Yeast vaginitis 04/24/2010    Patient Active Problem List   Diagnosis Date Noted  . Abdominal bloating 12/27/2015  . Abnormal weight gain 12/23/2015  . Peripheral edema 11/22/2015  . Atypical chest pain 11/10/2015  . Gastric polyps 11/10/2015  . Hypokalemia 08/15/2015  . Seasonal allergic rhinitis due to pollen 07/05/2015  . Sickle cell trait ([place]) 07/05/2015  . Genetic testing 06/13/2015  . Family history of breast cancer 06/02/2015  . Family history of malignant neoplasm of ovary 06/02/2015  . BV (bacterial vaginosis) 02/18/2015  . Hemorrhoids 02/18/2015  . Fibrositis 11/30/2014  . Left leg DVT ([place]) 11/18/2014  . History of DVT of lower extremity 11/18/2014  . History of laparoscopic adjustable gastric banding 01/29/2012  . Depression 12/19/2011  . Fibromyalgia 12/19/2011  . Arthritis 12/19/2011  . Irritable bowel  syndrome 12/19/2011  . Gastroesophageal reflux disease 12/19/2011  . Uterine leiomyoma 12/19/2011    Past Surgical History:  Procedure Laterality Date  . AUGMENTATION MAMMAPLASTY  6759   Silicone implants  . broken ankle Right   . CERVICAL CONE BIOPSY  1997  . COSMETIC SURGERY  2012   thigh lift  . CYSTECTOMY Right    obtain date from patient, was not on history form dated 06/16/10--benign cyst removed Rt.ovary  . DILATION AND CURETTAGE OF UTERUS    . LAPAROSCOPIC GASTRIC BANDING  05/20/2007  . WISDOM TOOTH EXTRACTION      OB History    Gravida Para Term Preterm AB Living   12 6 6   6 6    SAB TAB Ectopic Multiple Live Births   6       6       Home Medications    Prior to Admission medications   Medication Sig  Start Date End Date Taking? Authorizing Provider  acetaminophen (TYLENOL) 500 MG tablet Take 1 tablet (500 mg total) by mouth every 6 (six) hours as needed. 10/14/16   Law, Bea Graff, PA-C  ALPRAZolam (XANAX XR) 1 MG 24 hr tablet Take 1 mg by mouth as needed for anxiety.    [provider]  ALPRAZolam Duanne Moron) 1 MG tablet Take 1 mg by mouth at bedtime as needed for anxiety.    [provider]  amphetamine-dextroamphetamine (ADDERALL) 20 MG tablet  06/19/16   [provider]  ANUCORT-HC 25 MG suppository as needed. 04/12/16   [provider]  ARIPiprazole (ABILIFY) 15 MG tablet  08/16/16   [provider]  azithromycin (ZITHROMAX) 250 MG tablet Take 2 tablets (500 mg total) by mouth daily for 1 day, THEN 1 tablet (250 mg total) daily for 4 days. 06/06/17 06/11/17  Zigmund Gottron, NP  betamethasone valerate ointment (VALISONE) 0.1 % Apply a pea sized amount topically BID for 1-2 weeks as needed. Not for daily long term use. 04/30/17   Salvadore Dom, MD  cetirizine (ZYRTEC) 10 MG tablet Take 10 mg by mouth. 09/07/15   [provider]  fluticasone (FLONASE) 50 MCG/ACT nasal spray 1 spray by Each Nare route Three (3) times a day as needed. 10/12/15   [provider]  furosemide (LASIX) 20 MG tablet TK 1 T PO BID 05/20/16   [provider]  lubiprostone (AMITIZA) 24 MCG capsule Take 1 capsule (24 mcg total) by mouth daily with breakfast. 12/09/14   Boykin Nearing, MD  medroxyPROGESTERone (PROVERA) 5 MG tablet Take 1 tablet (5 mg total) by mouth daily. 04/30/17   Salvadore Dom, MD  metroNIDAZOLE (FLAGYL) 500 MG tablet Take 1 tablet (500 mg total) by mouth 2 (two) times daily. 04/30/17   Salvadore Dom, MD  MYDAYIS 50 MG CP24 TK ONE C PO QAM 04/23/16   [provider]  nystatin cream (MYCOSTATIN) Apply 1 application topically 2 (two) times daily. Apply to affected area BID for up to 7 days. 09/05/16   [name],  CNM  ondansetron (ZOFRAN) 4 MG tablet Take 1 tablet (4 mg total) by mouth every 8 (eight) hours as needed for nausea or vomiting. 06/06/17   Zigmund Gottron, NP  pantoprazole (PROTONIX) 40 MG tablet TK 1 T PO  QD 04/12/16   [provider]  Vitamin D, Ergocalciferol, (DRISDOL) 50000 UNITS CAPS capsule Take 50,000 Units by mouth 2 (two) times a week. Takes on sun and wed  [provider]  zolpidem (AMBIEN) 10 MG tablet Take 10 mg by mouth at bedtime.    [provider]    Family History Family History  Problem Relation Age of Onset  . Diabetes Mother   . Arthritis Mother   . Hyperlipidemia Mother   . Other Mother        VARICOSE VEINS; DECREASED HEART RATE; +Hysterectomy for unspecified reason  . Fibromyalgia Mother   . Colon polyps Mother        unspecified number  . Depression Mother   . Heart disease Father   . Hypertension Father   . Alcohol abuse Father   . Drug abuse Father   . Stroke Father   . Ovarian cancer Sister        dx. <43y  . Cervical cancer Sister 52  . Multiple sclerosis Brother   . Thyroid disease Brother        hypothyroidism  . Lung cancer Maternal Grandmother 74       secondhand smoke  . Depression Maternal Grandmother   . Miscarriages / Stillbirths Maternal Grandmother   . Birth defects Maternal Aunt        EXTRA DIGITS; MUSCULAR DISEASE  . Early death Maternal Aunt 39  . COPD Maternal Grandfather   . Stroke Maternal Grandfather   . Ovarian cancer Maternal Aunt 62       Dec.age 35 Ovarian CA  . Early death Maternal Aunt        d. three days after birth; unknown cause  . Breast cancer Maternal Aunt 27  . Colon polyps Maternal Aunt        unspecified number  . Liver disease Paternal Aunt   . Ovarian cancer Maternal Aunt 83  . Depression Maternal Aunt   . Heart attack Paternal Uncle        in mid-50s  . Alcohol abuse Maternal Uncle   . Breast cancer Cousin        maternal    Social History Social History    Tobacco Use  . Smoking status: Never Smoker  . Smokeless tobacco: Never Used  Substance Use Topics  . Alcohol use: Yes    Alcohol/week: 0.6 oz    Types: 1 Glasses of wine per week  . Drug use: No     Allergies   Flexeril [cyclobenzaprine hcl]; Latex; and Other   Review of Systems Review of Systems   Physical Exam Triage Vital Signs ED Triage Vitals [06/06/17 1029]  Enc Vitals Group     BP 107/67     Pulse Rate 96     Resp 18     Temp 97.9 F (36.6 C)     Temp src      SpO2 100 %     Weight      Height      Head Circumference      Peak Flow      Pain Score      Pain Loc      Pain Edu?      Excl. in [place]?    No data found.  Updated Vital Signs BP 107/67   Pulse 96   Temp 97.9 F (36.6 C)   Resp 18   LMP 04/09/2017 Comment: pregnancy test negative  SpO2 100%   Visual Acuity Right Eye Distance:   Left Eye Distance:   Bilateral Distance:    Right Eye Near:   Left Eye Near:    Bilateral Near:  Physical Exam  Constitutional: She is oriented to person, place, and time. She appears well-developed and well-nourished. No distress.  HENT:  Head: Normocephalic and atraumatic.  Right Ear: Tympanic membrane, external ear and ear canal normal.  Left Ear: Tympanic membrane, external ear and ear canal normal.  Nose: Mucosal edema and rhinorrhea present. Right sinus exhibits maxillary sinus tenderness. Right sinus exhibits no frontal sinus tenderness. Left sinus exhibits maxillary sinus tenderness. Left sinus exhibits no frontal sinus tenderness.  Mouth/Throat: Uvula is midline, oropharynx is clear and moist and mucous membranes are normal. No tonsillar exudate.  Eyes: Conjunctivae and EOM are normal. Pupils are equal, round, and reactive to light.  Cardiovascular: Normal rate, regular rhythm and normal heart sounds.  Pulmonary/Chest: Effort normal and breath sounds normal.  Lymphadenopathy:    She has no cervical adenopathy.  Neurological: She is alert and  oriented to person, place, and time.  Skin: Skin is warm and dry.     UC Treatments / Results  Labs (all labs ordered are listed, but only abnormal results are displayed) Labs Reviewed  POCT PREGNANCY, URINE    EKG  EKG Interpretation None       Radiology Dg Chest 2 View  Result Date: 06/06/2017 CLINICAL DATA:  Chest pain, productive cough. EXAM: CHEST  2 VIEW COMPARISON:  Radiographs of October 14, 2016. FINDINGS: The heart size and mediastinal contours are within normal limits. Both lungs are clear. No pneumothorax or pleural effusion is noted. The visualized skeletal structures are unremarkable. IMPRESSION: No active cardiopulmonary disease. Electronically Signed   By: Marijo Conception, M.D.   On: 06/06/2017 12:33    Procedures Procedures (including critical care time)  Medications Ordered in UC Medications  ondansetron (ZOFRAN-ODT) disintegrating tablet 4 mg (4 mg Oral Given 06/06/17 1151)     Initial Impression / Assessment and Plan / UC Course  I have reviewed the triage vital signs and the nursing notes.  Pertinent labs & imaging results that were available during my care of the patient were reviewed by me and considered in my medical decision making (see chart for details).     Chest xray negative. Due to worsening of symptoms after influenza still opted to initiate azithromycin at this time. Hx of dvt, discussed risk of PE with patient. Without tachypnea, tachycardia or hypoxia at this time, still with viral symptoms as well. Return precautions provided. Patient verbalized understanding and agreeable to plan.    Final Clinical Impressions(s) / UC Diagnoses   Final diagnoses:  Lower respiratory tract infection    ED Discharge Orders        Ordered    azithromycin (ZITHROMAX) 250 MG tablet     06/06/17 1240    ondansetron (ZOFRAN) 4 MG tablet  Every 8 hours PRN     06/06/17 1240       Controlled Substance Prescriptions [place] Controlled Substance Registry  consulted? Not Applicable   Zigmund Gottron, NP 06/06/17 1243

## 2017-06-09 ENCOUNTER — Emergency Department (HOSPITAL_COMMUNITY)
Admission: EM | Admit: 2017-06-09 | Discharge: 2017-06-10 | Disposition: A | Discharge status: Home / Self Care | Payer: Managed Care, Other (non HMO) | Attending: Emergency Medicine | Admitting: Emergency Medicine

## 2017-06-09 ENCOUNTER — Encounter (HOSPITAL_COMMUNITY): Payer: Self-pay | Admitting: Emergency Medicine

## 2017-06-09 ENCOUNTER — Emergency Department (HOSPITAL_COMMUNITY): Payer: Managed Care, Other (non HMO)

## 2017-06-09 DIAGNOSIS — R079 Chest pain, unspecified: Secondary | ICD-10-CM | POA: Insufficient documentation

## 2017-06-09 DIAGNOSIS — Z5321 Procedure and treatment not carried out due to patient leaving prior to being seen by health care provider: Secondary | ICD-10-CM | POA: Diagnosis not present

## 2017-06-09 LAB — BASIC METABOLIC PANEL
Anion gap: 8 (ref 5–15)
BUN: <5 mg/dL — ABNORMAL LOW (ref 6–20)
CALCIUM: 8.7 mg/dL — AB (ref 8.9–10.3)
CHLORIDE: 104 mmol/L (ref 101–111)
CO2: 27 mmol/L (ref 22–32)
Creatinine, Ser: 1.02 mg/dL — ABNORMAL HIGH (ref 0.44–1.00)
GFR calc non Af Amer: >60 mL/min (ref >60)
GLUCOSE: 94 mg/dL (ref 65–99)
POTASSIUM: 3.1 mmol/L — AB (ref 3.5–5.1)
Sodium: 139 mmol/L (ref 135–145)

## 2017-06-09 LAB — CBC
HEMATOCRIT: 34.4 % — AB (ref 36.0–46.0)
HEMOGLOBIN: 11.5 g/dL — AB (ref 12.0–15.0)
MCH: 27.1 pg (ref 26.0–34.0)
MCHC: 33.4 g/dL (ref 30.0–36.0)
MCV: 80.9 fL (ref 78.0–100.0)
Platelets: 377 10*3/uL (ref 150–400)
RBC: 4.25 MIL/uL (ref 3.87–5.11)
RDW: 17.8 % — ABNORMAL HIGH (ref 11.5–15.5)
WBC: 7.8 10*3/uL (ref 4.0–10.5)

## 2017-06-09 LAB — I-STAT TROPONIN, ED: Troponin i, poc: 0 ng/mL (ref 0.00–0.08)

## 2017-06-09 LAB — I-STAT BETA HCG BLOOD, ED (MC, WL, AP ONLY): I-stat hCG, quantitative: <5 m[IU]/mL (ref <5)

## 2017-06-09 NOTE — ED Triage Notes (Signed)
Pt c/o chest ache/intermittent sharp pains that started this morning. Recently diagnosed with pneumonia, completed antibiotic course with no improvement. Denies shortness of breath.

## 2017-06-09 NOTE — ED Notes (Signed)
Pt taken back by Triage Tech.

## 2017-06-09 NOTE — ED Notes (Signed)
Results reviewed.  No changes in acuity at this time 

## 2017-06-10 NOTE — ED Notes (Signed)
No answer for treatment room, not visualized in lobby. Presumed to have left without being seen.

## 2017-06-15 ENCOUNTER — Encounter (HOSPITAL_COMMUNITY): Payer: Self-pay | Admitting: Family Medicine

## 2017-06-15 ENCOUNTER — Ambulatory Visit (HOSPITAL_COMMUNITY)
Admission: EM | Admit: 2017-06-15 | Discharge: 2017-06-15 | Disposition: A | Discharge status: Home / Self Care | Payer: Managed Care, Other (non HMO) | Attending: Family Medicine | Admitting: Family Medicine

## 2017-06-15 DIAGNOSIS — J069 Acute upper respiratory infection, unspecified: Secondary | ICD-10-CM

## 2017-06-15 MED ORDER — ONDANSETRON 4 MG PO TBDP
4.0000 mg | ORAL_TABLET | Freq: Once | ORAL | Status: AC
  Administered 2017-06-15: 4 mg via ORAL

## 2017-06-15 MED ORDER — ONDANSETRON HCL 4 MG PO TABS: 4.0000 mg | ORAL_TABLET | Freq: Three times a day (TID) | ORAL | 0 refills | Status: DC | PRN

## 2017-06-15 MED ORDER — ONDANSETRON 4 MG PO TBDP
ORAL_TABLET | ORAL | Status: AC
  Filled 2017-06-15: qty 1

## 2017-06-15 NOTE — Discharge Instructions (Signed)
Push fluids to ensure adequate hydration and keep secretions thin. . Tylenol and/or ibuprofen as needed for pain or fevers.  Ibuprofen once stomach symptoms have improved. Zofran as needed for nausea. Rest. Follow up with PCP next week for recheck. Return sooner if worsening of symptoms, develop increased pain, fevers, shortness of breath or otherwise worsening.

## 2017-06-15 NOTE — ED Triage Notes (Signed)
Pt here for flu like symptoms since yesterday. Reports HA, body aches, no fever.

## 2017-06-15 NOTE — ED Provider Notes (Signed)
Tiffany [name]    CSN: [id number] Arrival date & time: 06/15/17  1228     History   Chief Complaint Chief Complaint  Patient presents with  . Influenza    HPI Tiffany [name] is a 42 y.o. female.   Tiffany [name] presents with complaints of onset of body aches, headache, cough, dizziness which started yesterday. No known fevers. Cough is productive. Mild sore throat. Without ear pain. Vomited three times today. Still without nausea. Without diarrhea or abdominal pain. She has been drinking fluids, normal urination. No known ill contacts. Complete course of tamiflu, prescribed 2/19, followed by azithromycin 2/28. States she had improved until yesterday. Went to er 3/3, LWBS but had normal chest xray and cbc at that time. Hx of anemia, anxiety, depression, dvt, gerd, fibromyalgia, ibs. She did take theraflu and alkaseltzer last night which allowed her to sleep. Has not taken any RX today.    ROS per HPI.          Past Medical History:  Diagnosis Date  . Abnormal Pap smear 1997   LAST PAP 10/2010  . Allergy    SEASONAL  . Anemia    CHRONIC  . 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 Arthritis   . Blood dyscrasia    SICKLE CELL TRAIT  . Blood transfusion without reported diagnosis 2005   after vaginal delivery  . Breast mass in female 2003  . Broken foot 03/16/2016   right  . Bruises easily   . BV (bacterial vaginosis)   . Cyst of ovary 2003  . Depression 2003  . DUB (dysfunctional uterine bleeding) 2003  . DVT, lower extremity ([place]) 10/2014   left leg  . Fibroid    LONG TERM DX  . Fibromyalgia 2004  . Fibrositis   . GERD (gastroesophageal reflux disease) 2007  . H/O amenorrhea 2006  . H/O menorrhagia 2011  . H/O sickle cell trait   . Heart murmur 2007  . History of abnormal cervical Pap smear 2003   had colposcopy and cryo.  Follow up paps are normal.  . Hx: UTI (urinary tract infection) 2003  . Hypertension 2005   RESOLVED WITH WT LOSS  . IBD (inflammatory  bowel disease)   . IBS (irritable bowel syndrome)   . Infection    OCC YEAST  . Low back pain 2003  . Menses, irregular 2003  . Migraine   . Obese 2003  . Pulmonary embolism ([place]) 10/2014  . Sacroiliitis ([place])   . Sickle-cell trait ([place])   . Trichomonas   . Uterine leiomyoma   . Vaginitis and vulvovaginitis 2004  . Yeast vaginitis 04/24/2010    Patient Active Problem List   Diagnosis Date Noted  . Abdominal bloating 12/27/2015  . Abnormal weight gain 12/23/2015  . Peripheral edema 11/22/2015  . Atypical chest pain 11/10/2015  . Gastric polyps 11/10/2015  . Hypokalemia 08/15/2015  . Seasonal allergic rhinitis due to pollen 07/05/2015  . Sickle cell trait ([place]) 07/05/2015  . Genetic testing 06/13/2015  . Family history of breast cancer 06/02/2015  . Family history of malignant neoplasm of ovary 06/02/2015  . BV (bacterial vaginosis) 02/18/2015  . Hemorrhoids 02/18/2015  . Fibrositis 11/30/2014  . Left leg DVT ([place]) 11/18/2014  . History of DVT of lower extremity 11/18/2014  . History of laparoscopic adjustable gastric banding 01/29/2012  . Depression 12/19/2011  . Fibromyalgia 12/19/2011  . Arthritis 12/19/2011  . Irritable bowel syndrome 12/19/2011  . Gastroesophageal reflux  disease 12/19/2011  . Uterine leiomyoma 12/19/2011    Past Surgical History:  Procedure Laterality Date  . AUGMENTATION MAMMAPLASTY  2952   Silicone implants  . broken ankle Right   . CERVICAL CONE BIOPSY  1997  . COSMETIC SURGERY  2012   thigh lift  . CYSTECTOMY Right    obtain date from patient, was not on history form dated 06/16/10--benign cyst removed Rt.ovary  . DILATION AND CURETTAGE OF UTERUS    . LAPAROSCOPIC GASTRIC BANDING  05/20/2007  . WISDOM TOOTH EXTRACTION      OB History    Gravida Para Term Preterm AB Living   12 6 6   6 6    SAB TAB Ectopic Multiple Live Births   6       6       Home Medications    Prior to Admission medications   Medication Sig Start Date End  Date Taking? Authorizing Provider  acetaminophen (TYLENOL) 500 MG tablet Take 1 tablet (500 mg total) by mouth every 6 (six) hours as needed. 10/14/16   Law, Bea Graff, PA-C  ALPRAZolam (XANAX XR) 1 MG 24 hr tablet Take 1 mg by mouth as needed for anxiety.    [provider]  ALPRAZolam Duanne Moron) 1 MG tablet Take 1 mg by mouth at bedtime as needed for anxiety.    [provider]  amphetamine-dextroamphetamine (ADDERALL) 20 MG tablet  06/19/16   [provider]  ANUCORT-HC 25 MG suppository as needed. 04/12/16   [provider]  ARIPiprazole (ABILIFY) 15 MG tablet  08/16/16   [provider]  betamethasone valerate ointment (VALISONE) 0.1 % Apply a pea sized amount topically BID for 1-2 weeks as needed. Not for daily long term use. 04/30/17   Salvadore Dom, MD  cetirizine (ZYRTEC) 10 MG tablet Take 10 mg by mouth. 09/07/15   [provider]  fluticasone (FLONASE) 50 MCG/ACT nasal spray 1 spray by Each Nare route Three (3) times a day as needed. 10/12/15   [provider]  lubiprostone (AMITIZA) 24 MCG capsule Take 1 capsule (24 mcg total) by mouth daily with breakfast. 12/09/14   [name], [name], MD  MYDAYIS 50 MG CP24 TK ONE C PO QAM 04/23/16   [provider]  nystatin cream (MYCOSTATIN) Apply 1 application topically 2 (two) times daily. Apply to affected area BID for up to 7 days. 09/05/16   [name], CNM  ondansetron (ZOFRAN) 4 MG tablet Take 1 tablet (4 mg total) by mouth every 8 (eight) hours as needed for nausea or vomiting. 06/15/17   Zigmund Gottron, NP  pantoprazole (PROTONIX) 40 MG tablet TK 1 T PO  QD 04/12/16   [provider]  Vitamin D, Ergocalciferol, (DRISDOL) 50000 UNITS CAPS capsule Take 50,000 Units by mouth 2 (two) times a week. Takes on sun and wed    [provider]  zolpidem (AMBIEN) 10 MG tablet Take 10 mg by mouth at bedtime.    [provider]    Family History Family  History  Problem Relation Age of Onset  . Diabetes Mother   . Arthritis Mother   . Hyperlipidemia Mother   . Other Mother        VARICOSE VEINS; DECREASED HEART RATE; +Hysterectomy for unspecified reason  . Fibromyalgia Mother   . Colon polyps Mother        unspecified number  . Depression Mother   . Heart disease Father   . Hypertension Father   .  Alcohol abuse Father   . Drug abuse Father   . Stroke Father   . Ovarian cancer Sister        dx. <43y  . Cervical cancer Sister 43  . Multiple sclerosis Brother   . Thyroid disease Brother        hypothyroidism  . Lung cancer Maternal Grandmother 74       secondhand smoke  . Depression Maternal Grandmother   . Miscarriages / Stillbirths Maternal Grandmother   . Birth defects Maternal Aunt        EXTRA DIGITS; MUSCULAR DISEASE  . Early death Maternal Aunt 81  . COPD Maternal Grandfather   . Stroke Maternal Grandfather   . Ovarian cancer Maternal Aunt 31       Dec.age 103 Ovarian CA  . Early death Maternal Aunt        d. three days after birth; unknown cause  . Breast cancer Maternal Aunt 53  . Colon polyps Maternal Aunt        unspecified number  . Liver disease Paternal Aunt   . Ovarian cancer Maternal Aunt 22  . Depression Maternal Aunt   . Heart attack Paternal Uncle        in mid-50s  . Alcohol abuse Maternal Uncle   . Breast cancer Cousin        maternal    Social History Social History   Tobacco Use  . Smoking status: Never Smoker  . Smokeless tobacco: Never Used  Substance Use Topics  . Alcohol use: Yes    Alcohol/week: 0.6 oz    Types: 1 Glasses of wine per week  . Drug use: No     Allergies   Flexeril [cyclobenzaprine hcl]; Latex; and Other   Review of Systems Review of Systems   Physical Exam Triage Vital Signs ED Triage Vitals  Enc Vitals Group     BP --      Pulse Rate 06/15/17 1403 87     Resp --      Temp 06/15/17 1403 97.7 F (36.5 C)     Temp src --      SpO2 06/15/17 1403 98 %       Weight --      Height --      Head Circumference --      Peak Flow --      Pain Score 06/15/17 1401 10     Pain Loc --      Pain Edu? --      Excl. in [place]? --    No data found.  Updated Vital Signs Pulse 87   Temp 97.7 F (36.5 C)   SpO2 98%   Visual Acuity Right Eye Distance:   Left Eye Distance:   Bilateral Distance:    Right Eye Near:   Left Eye Near:    Bilateral Near:     Physical Exam  Constitutional: She is oriented to person, place, and time. She appears well-developed and well-nourished. No distress.  HENT:  Head: Normocephalic and atraumatic.  Right Ear: Tympanic membrane, external ear and ear canal normal.  Left Ear: Tympanic membrane, external ear and ear canal normal.  Nose: Nose normal.  Mouth/Throat: Uvula is midline, oropharynx is clear and moist and mucous membranes are normal. No tonsillar exudate.  Eyes: Conjunctivae and EOM are normal. Pupils are equal, round, and reactive to light.  Cardiovascular: Normal rate, regular rhythm and normal heart sounds.  Pulmonary/Chest: Effort normal and breath sounds normal.  Abdominal: Soft. Bowel sounds are normal. There is generalized tenderness. There is no rigidity, no rebound, no guarding, no CVA tenderness, no tenderness at McBurney's point and negative Murphy's sign.  Lymphadenopathy:    She has no cervical adenopathy.  Neurological: She is alert and oriented to person, place, and time.  Skin: Skin is warm and dry.     UC Treatments / Results  Labs (all labs ordered are listed, but only abnormal results are displayed) Labs Reviewed - No data to display  EKG  EKG Interpretation None       Radiology No results found.  Procedures Procedures (including critical care time)  Medications Ordered in UC Medications  ondansetron (ZOFRAN-ODT) disintegrating tablet 4 mg (4 mg Oral Given 06/15/17 1445)     Initial Impression / Assessment and Plan / UC Course  I have reviewed the triage vital signs  and the nursing notes.  Pertinent labs & imaging results that were available during my care of the patient were reviewed by me and considered in my medical decision making (see chart for details).     Benign physical exam. Non toxic in appearance. Afebrile. Without tachycardia or tachypnea. Drinking fluids in exam room and tolerating. zofran provided in clinic and provided as script. Return precautions provided. Encouraged follow up for recheck next week with PCP. Continue with supportive cares. Patient verbalized understanding and agreeable to plan.    Final Clinical Impressions(s) / UC Diagnoses   Final diagnoses:  Viral URI    ED Discharge Orders        Ordered    ondansetron (ZOFRAN) 4 MG tablet  Every 8 hours PRN     06/15/17 1443       Controlled Substance Prescriptions [place] Controlled Substance Registry consulted? Not Applicable   Zigmund Gottron, NP 06/15/17 1451

## 2017-11-27 DIAGNOSIS — D649 Anemia, unspecified: Secondary | ICD-10-CM | POA: Insufficient documentation

## 2017-12-17 DIAGNOSIS — R87610 Atypical squamous cells of undetermined significance on cytologic smear of cervix (ASC-US): Secondary | ICD-10-CM | POA: Insufficient documentation

## 2017-12-17 DIAGNOSIS — R87810 Cervical high risk human papillomavirus (HPV) DNA test positive: Secondary | ICD-10-CM

## 2017-12-18 ENCOUNTER — Other Ambulatory Visit: Payer: Self-pay | Admitting: Family Medicine

## 2017-12-18 DIAGNOSIS — Z1231 Encounter for screening mammogram for malignant neoplasm of breast: Secondary | ICD-10-CM

## 2018-01-15 ENCOUNTER — Ambulatory Visit
Admission: RE | Admit: 2018-01-15 | Discharge: 2018-01-15 | Disposition: A | Discharge status: Home / Self Care | Payer: Managed Care, Other (non HMO) | Source: Ambulatory Visit | Attending: Family Medicine | Admitting: Family Medicine

## 2018-01-15 DIAGNOSIS — Z1231 Encounter for screening mammogram for malignant neoplasm of breast: Secondary | ICD-10-CM

## 2018-04-15 ENCOUNTER — Emergency Department (HOSPITAL_COMMUNITY)
Admission: EM | Admit: 2018-04-15 | Discharge: 2018-04-16 | Disposition: A | Discharge status: Home / Self Care | Payer: Managed Care, Other (non HMO) | Attending: Emergency Medicine | Admitting: Emergency Medicine

## 2018-04-15 ENCOUNTER — Encounter (HOSPITAL_COMMUNITY): Payer: Self-pay | Admitting: Emergency Medicine

## 2018-04-15 ENCOUNTER — Emergency Department (HOSPITAL_COMMUNITY): Payer: Managed Care, Other (non HMO)

## 2018-04-15 DIAGNOSIS — Z79899 Other long term (current) drug therapy: Secondary | ICD-10-CM | POA: Diagnosis not present

## 2018-04-15 DIAGNOSIS — R634 Abnormal weight loss: Secondary | ICD-10-CM | POA: Diagnosis not present

## 2018-04-15 DIAGNOSIS — M791 Myalgia, unspecified site: Secondary | ICD-10-CM | POA: Diagnosis not present

## 2018-04-15 DIAGNOSIS — E876 Hypokalemia: Secondary | ICD-10-CM | POA: Insufficient documentation

## 2018-04-15 DIAGNOSIS — R531 Weakness: Secondary | ICD-10-CM | POA: Insufficient documentation

## 2018-04-15 DIAGNOSIS — Z91040 Latex allergy status: Secondary | ICD-10-CM | POA: Insufficient documentation

## 2018-04-15 DIAGNOSIS — I10 Essential (primary) hypertension: Secondary | ICD-10-CM | POA: Insufficient documentation

## 2018-04-15 DIAGNOSIS — R112 Nausea with vomiting, unspecified: Secondary | ICD-10-CM | POA: Diagnosis not present

## 2018-04-15 LAB — CBC
HEMATOCRIT: 37.7 % (ref 36.0–46.0)
Hemoglobin: 12.8 g/dL (ref 12.0–15.0)
MCH: 27.9 pg (ref 26.0–34.0)
MCHC: 34 g/dL (ref 30.0–36.0)
MCV: 82.3 fL (ref 80.0–100.0)
Platelets: 346 10*3/uL (ref 150–400)
RBC: 4.58 MIL/uL (ref 3.87–5.11)
RDW: 15.6 % — ABNORMAL HIGH (ref 11.5–15.5)
WBC: 8 10*3/uL (ref 4.0–10.5)
nRBC: 0 % (ref 0.0–0.2)

## 2018-04-15 LAB — MAGNESIUM: Magnesium: 1.5 mg/dL — ABNORMAL LOW (ref 1.7–2.4)

## 2018-04-15 LAB — BASIC METABOLIC PANEL
Anion gap: 9 (ref 5–15)
BUN: 7 mg/dL (ref 6–20)
CO2: 28 mmol/L (ref 22–32)
Calcium: 9.2 mg/dL (ref 8.9–10.3)
Chloride: 98 mmol/L (ref 98–111)
Creatinine, Ser: 0.97 mg/dL (ref 0.44–1.00)
GFR calc Af Amer: >60 mL/min (ref >60)
GLUCOSE: 116 mg/dL — AB (ref 70–99)
Potassium: 2.6 mmol/L — CL (ref 3.5–5.1)
SODIUM: 135 mmol/L (ref 135–145)

## 2018-04-15 LAB — I-STAT TROPONIN, ED: Troponin i, poc: 0 ng/mL (ref 0.00–0.08)

## 2018-04-15 LAB — I-STAT BETA HCG BLOOD, ED (MC, WL, AP ONLY): I-stat hCG, quantitative: <5 m[IU]/mL (ref <5)

## 2018-04-15 MED ORDER — SODIUM CHLORIDE 0.9 % IV BOLUS
1000.0000 mL | Freq: Once | INTRAVENOUS | Status: AC
  Administered 2018-04-15: 1000 mL via INTRAVENOUS

## 2018-04-15 MED ORDER — POTASSIUM CHLORIDE ER 20 MEQ PO TBCR
20.0000 meq | EXTENDED_RELEASE_TABLET | Freq: Every day | ORAL | 0 refills | Status: DC
Start: 2018-04-15 — End: 2018-06-11

## 2018-04-15 MED ORDER — MAGNESIUM SULFATE 2 GM/50ML IV SOLN
2.0000 g | Freq: Once | INTRAVENOUS | Status: AC
Start: 2018-04-15 — End: 2018-04-15
  Administered 2018-04-15: 2 g via INTRAVENOUS
  Filled 2018-04-15: qty 50

## 2018-04-15 MED ORDER — FAMOTIDINE IN NACL 20-0.9 MG/50ML-% IV SOLN
20.0000 mg | Freq: Once | INTRAVENOUS | Status: AC
Start: 2018-04-15 — End: 2018-04-15
  Administered 2018-04-15: 20 mg via INTRAVENOUS
  Filled 2018-04-15: qty 50

## 2018-04-15 MED ORDER — LIDOCAINE VISCOUS HCL 2 % MT SOLN
15.0000 mL | Freq: Once | OROMUCOSAL | Status: AC
  Administered 2018-04-15: 15 mL via ORAL
  Filled 2018-04-15: qty 15

## 2018-04-15 MED ORDER — METOCLOPRAMIDE HCL 5 MG/ML IJ SOLN
10.0000 mg | Freq: Once | INTRAMUSCULAR | Status: AC
  Administered 2018-04-15: 10 mg via INTRAVENOUS
  Filled 2018-04-15: qty 2

## 2018-04-15 MED ORDER — POTASSIUM CHLORIDE CRYS ER 20 MEQ PO TBCR
40.0000 meq | EXTENDED_RELEASE_TABLET | Freq: Once | ORAL | Status: AC
  Administered 2018-04-15: 40 meq via ORAL
  Filled 2018-04-15: qty 2

## 2018-04-15 MED ORDER — ALUM & MAG HYDROXIDE-SIMETH 200-200-20 MG/5ML PO SUSP
30.0000 mL | Freq: Once | ORAL | Status: AC
  Administered 2018-04-15: 30 mL via ORAL
  Filled 2018-04-15: qty 30

## 2018-04-15 MED ORDER — PROMETHAZINE HCL 25 MG/ML IJ SOLN
12.5000 mg | Freq: Once | INTRAMUSCULAR | Status: AC
  Administered 2018-04-15: 12.5 mg via INTRAVENOUS
  Filled 2018-04-15: qty 1

## 2018-04-15 MED ORDER — ONDANSETRON HCL 4 MG/2ML IJ SOLN
4.0000 mg | Freq: Once | INTRAMUSCULAR | Status: AC
  Administered 2018-04-15: 4 mg via INTRAVENOUS
  Filled 2018-04-15: qty 2

## 2018-04-15 MED ORDER — PROMETHAZINE HCL 25 MG PO TABS: 25.0000 mg | ORAL_TABLET | Freq: Four times a day (QID) | ORAL | 0 refills | Status: DC | PRN

## 2018-04-15 MED ORDER — MAGNESIUM OXIDE 400 MG PO TABS: 400.0000 mg | ORAL_TABLET | Freq: Every day | ORAL | 0 refills | Status: DC

## 2018-04-15 NOTE — ED Provider Notes (Addendum)
[facility] Provider Note   CSN: [id number] Arrival date & time: 04/15/18  1724     History   Chief Complaint Chief Complaint  Patient presents with  . Chest Pain  . Influenza    HPI Tiffany [name] is a 43 y.o. female.  Pt presents to the ED today with myalgias, n/v, and weakness for the past 2 weeks.  She said she's lost 17 lbs in the last 2 weeks without trying.  She has had low potassium in the past and feels like she may have this again.  The pt said she has hardly been able to get out of bed due to illness.       Past Medical History:  Diagnosis Date  . Abnormal Pap smear 1997   LAST PAP 10/2010  . Allergy    SEASONAL  . Anemia    CHRONIC  . 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 Arthritis   . Blood dyscrasia    SICKLE CELL TRAIT  . Blood transfusion without reported diagnosis 2005   after vaginal delivery  . Breast mass in female 2003  . Broken foot 03/16/2016   right  . Bruises easily   . BV (bacterial vaginosis)   . Cyst of ovary 2003  . Depression 2003  . DUB (dysfunctional uterine bleeding) 2003  . DVT, lower extremity ([place]) 10/2014   left leg  . Fibroid    LONG TERM DX  . Fibromyalgia 2004  . Fibrositis   . GERD (gastroesophageal reflux disease) 2007  . H/O amenorrhea 2006  . H/O menorrhagia 2011  . H/O sickle cell trait   . Heart murmur 2007  . History of abnormal cervical Pap smear 2003   had colposcopy and cryo.  Follow up paps are normal.  . Hx: UTI (urinary tract infection) 2003  . Hypertension 2005   RESOLVED WITH WT LOSS  . IBD (inflammatory bowel disease)   . IBS (irritable bowel syndrome)   . Infection    OCC YEAST  . Low back pain 2003  . Menses, irregular 2003  . Migraine   . Obese 2003  . Pulmonary embolism ([place]) 10/2014  . Sacroiliitis ([place])   . Sickle-cell trait ([place])   . Trichomonas   . Uterine leiomyoma   . Vaginitis and vulvovaginitis 2004  . Yeast vaginitis 04/24/2010     Patient Active Problem List   Diagnosis Date Noted  . Abdominal bloating 12/27/2015  . Abnormal weight gain 12/23/2015  . Peripheral edema 11/22/2015  . Atypical chest pain 11/10/2015  . Gastric polyps 11/10/2015  . Hypokalemia 08/15/2015  . Seasonal allergic rhinitis due to pollen 07/05/2015  . Sickle cell trait ([place]) 07/05/2015  . Genetic testing 06/13/2015  . Family history of breast cancer 06/02/2015  . Family history of malignant neoplasm of ovary 06/02/2015  . BV (bacterial vaginosis) 02/18/2015  . Hemorrhoids 02/18/2015  . Fibrositis 11/30/2014  . Left leg DVT ([place]) 11/18/2014  . History of DVT of lower extremity 11/18/2014  . History of laparoscopic adjustable gastric banding 01/29/2012  . Depression 12/19/2011  . Fibromyalgia 12/19/2011  . Arthritis 12/19/2011  . Irritable bowel syndrome 12/19/2011  . Gastroesophageal reflux disease 12/19/2011  . Uterine leiomyoma 12/19/2011    Past Surgical History:  Procedure Laterality Date  . AUGMENTATION MAMMAPLASTY  5361   Silicone implants  . broken ankle Right   . CERVICAL CONE BIOPSY  1997  . COSMETIC SURGERY  2012  thigh lift  . CYSTECTOMY Right    obtain date from patient, was not on history form dated 06/16/10--benign cyst removed Rt.ovary  . DILATION AND CURETTAGE OF UTERUS    . LAPAROSCOPIC GASTRIC BANDING  05/20/2007  . WISDOM TOOTH EXTRACTION       OB History    Gravida  12   Para  6   Term  6   Preterm      AB  6   Living  6     SAB  6   TAB      Ectopic      Multiple      Live Births  6            Home Medications    Prior to Admission medications   Medication Sig Start Date End Date Taking? Authorizing Provider  acetaminophen (TYLENOL) 500 MG tablet Take 1 tablet (500 mg total) by mouth every 6 (six) hours as needed. Patient taking differently: Take 500 mg by mouth every 6 (six) hours as needed for mild pain.  10/14/16  Yes Law, Alexandra M, PA-C  ALPRAZolam (XANAX XR) 1 MG 24  hr tablet Take 1 mg by mouth as needed for anxiety.   Yes [provider]  ALPRAZolam Duanne Moron) 1 MG tablet Take 1 mg by mouth at bedtime as needed for anxiety.   Yes [provider]  amphetamine-dextroamphetamine (ADDERALL) 20 MG tablet Take 20 mg by mouth daily.  06/19/16  Yes [provider]  cetirizine (ZYRTEC) 10 MG tablet Take 10 mg by mouth daily.  09/07/15  Yes [provider]  fluticasone (FLONASE) 50 MCG/ACT nasal spray Place 1 spray into both nostrils daily as needed for allergies.  10/12/15  Yes [provider]  MYDAYIS 50 MG CP24 Take 50 mg by mouth daily.  04/23/16  Yes [provider]  ondansetron (ZOFRAN) 4 MG tablet Take 1 tablet (4 mg total) by mouth every 8 (eight) hours as needed for nausea or vomiting. 06/15/17  Yes Burky, Lanelle Bal B, NP  zolpidem (AMBIEN) 10 MG tablet Take 10 mg by mouth at bedtime.   Yes [provider]  betamethasone valerate ointment (VALISONE) 0.1 % Apply a pea sized amount topically BID for 1-2 weeks as needed. Not for daily long term use. Patient not taking: Reported on 04/15/2018 04/30/17   Salvadore Dom, MD  lubiprostone (AMITIZA) 24 MCG capsule Take 1 capsule (24 mcg total) by mouth daily with breakfast. Patient not taking: Reported on 04/15/2018 12/09/14   Boykin Nearing, MD  magnesium oxide (MAG-OX) 400 MG tablet Take 1 tablet (400 mg total) by mouth daily. 04/15/18   Isla Pence, MD  nystatin cream (MYCOSTATIN) Apply 1 application topically 2 (two) times daily. Apply to affected area BID for up to 7 days. Patient not taking: Reported on 04/15/2018 09/05/16   [name], CNM  potassium chloride 20 MEQ TBCR Take 20 mEq by mouth daily. 04/15/18   Isla Pence, MD  promethazine (PHENERGAN) 25 MG tablet Take 1 tablet (25 mg total) by mouth every 6 (six) hours as needed for nausea or vomiting. 04/15/18   Isla Pence, MD    Family History Family History  Problem Relation Age of Onset  .  Diabetes Mother   . Arthritis Mother   . Hyperlipidemia Mother   . Other Mother        VARICOSE VEINS; DECREASED HEART RATE; +Hysterectomy for unspecified reason  . Fibromyalgia Mother   . Colon polyps  Mother        unspecified number  . Depression Mother   . Heart disease Father   . Hypertension Father   . Alcohol abuse Father   . Drug abuse Father   . Stroke Father   . Ovarian cancer Sister        dx. <43y  . Cervical cancer Sister 35  . Multiple sclerosis Brother   . Thyroid disease Brother        hypothyroidism  . Lung cancer Maternal Grandmother 74       secondhand smoke  . Depression Maternal Grandmother   . Miscarriages / Stillbirths Maternal Grandmother   . Birth defects Maternal Aunt        EXTRA DIGITS; MUSCULAR DISEASE  . Early death Maternal Aunt 60  . COPD Maternal Grandfather   . Stroke Maternal Grandfather   . Ovarian cancer Maternal Aunt 51       Dec.age 28 Ovarian CA  . Early death Maternal Aunt        d. three days after birth; unknown cause  . Breast cancer Maternal Aunt 3  . Colon polyps Maternal Aunt        unspecified number  . Liver disease Paternal Aunt   . Ovarian cancer Maternal Aunt 11  . Depression Maternal Aunt   . Heart attack Paternal Uncle        in mid-50s  . Alcohol abuse Maternal Uncle   . Breast cancer Cousin        maternal    Social History Social History   Tobacco Use  . Smoking status: Never Smoker  . Smokeless tobacco: Never Used  Substance Use Topics  . Alcohol use: Yes    Alcohol/week: 1.0 standard drinks    Types: 1 Glasses of wine per week  . Drug use: No     Allergies   Flexeril [cyclobenzaprine hcl]; Latex; and Other   Review of Systems Review of Systems  Constitutional: Positive for fatigue and unexpected weight change.  Gastrointestinal: Positive for nausea and vomiting.  All other systems reviewed and are negative.    Physical Exam Updated Vital Signs BP (!) 133/98   Pulse (!) 103   Temp  98.2 F (36.8 C) (Oral)   Resp 16   SpO2 98%   Physical Exam Vitals signs and nursing note reviewed.  Constitutional:      Appearance: She is well-developed.  HENT:     Head: Normocephalic and atraumatic.     Mouth/Throat:     Mouth: Mucous membranes are dry.  Eyes:     Extraocular Movements: Extraocular movements intact.     Pupils: Pupils are equal, round, and reactive to light.  Neck:     Musculoskeletal: Normal range of motion and neck supple.  Cardiovascular:     Rate and Rhythm: Regular rhythm. Tachycardia present.     Heart sounds: Normal heart sounds.  Pulmonary:     Effort: Pulmonary effort is normal.     Breath sounds: Normal breath sounds.  Abdominal:     General: Bowel sounds are normal.     Palpations: Abdomen is soft.  Musculoskeletal: Normal range of motion.  Skin:    General: Skin is warm and dry.     Capillary Refill: Capillary refill takes less than 2 seconds.  Neurological:     General: No focal deficit present.     Mental Status: She is alert and oriented to person, place, and time.  Psychiatric:  Mood and Affect: Mood normal.        Behavior: Behavior normal.      ED Treatments / Results  Labs (all labs ordered are listed, but only abnormal results are displayed) Labs Reviewed  BASIC METABOLIC PANEL - Abnormal; Notable for the following components:      Result Value   Potassium 2.6 (*)    Glucose, Bld 116 (*)    All other components within normal limits  CBC - Abnormal; Notable for the following components:   RDW 15.6 (*)    All other components within normal limits  MAGNESIUM - Abnormal; Notable for the following components:   Magnesium 1.5 (*)    All other components within normal limits  I-STAT TROPONIN, ED  I-STAT BETA HCG BLOOD, ED ([facility], [facility], [facility] ONLY)    EKG EKG Interpretation  Date/Time:  Tuesday April 15 2018 17:37:31 EST Ventricular Rate:  123 PR Interval:  150 QRS Duration: 80 QT Interval:  310 QTC  Calculation: 443 R Axis:   94 Text Interpretation:  Sinus tachycardia Possible Left atrial enlargement Rightward axis Marked ST abnormality, possible inferior subendocardial injury Abnormal ECG No significant change since last tracing Confirmed by Isla Pence [phone]) on 04/15/2018 7:38:24 PM   Radiology Dg Chest 2 View  Result Date: 04/15/2018 CLINICAL DATA:  Chest pain and shortness of breath for 2 weeks. Sickle cell trait. EXAM: CHEST - 2 VIEW COMPARISON:  06/09/2017 FINDINGS: The heart size and mediastinal contours are within normal limits. Both lungs are clear. The visualized skeletal structures are unremarkable. Gastric lap band again seen in place. IMPRESSION: No active cardiopulmonary disease. Electronically Signed   By: Earle Gell M.D.   On: 04/15/2018 18:21    Procedures Procedures (including critical care time)  Medications Ordered in ED Medications  sodium chloride 0.9 % bolus 1,000 mL (0 mLs Intravenous Stopped 04/15/18 2241)  potassium chloride SA (K-DUR,KLOR-CON) CR tablet 40 mEq (40 mEq Oral Given 04/15/18 2016)  ondansetron (ZOFRAN) injection 4 mg (4 mg Intravenous Given 04/15/18 2016)  magnesium sulfate IVPB 2 g 50 mL (0 g Intravenous Stopped 04/15/18 2241)  promethazine (PHENERGAN) injection 12.5 mg (12.5 mg Intravenous Given 04/15/18 2102)  alum & mag hydroxide-simeth (MAALOX/MYLANTA) 200-200-20 MG/5ML suspension 30 mL (30 mLs Oral Given 04/15/18 2238)    And  lidocaine (XYLOCAINE) 2 % viscous mouth solution 15 mL (15 mLs Oral Given 04/15/18 2238)  metoCLOPramide (REGLAN) injection 10 mg (10 mg Intravenous Given 04/15/18 2236)  famotidine (PEPCID) IVPB 20 mg premix (0 mg Intravenous Stopped 04/15/18 2313)     Initial Impression / Assessment and Plan / ED Course  I have reviewed the triage vital signs and the nursing notes.  Pertinent labs & imaging results that were available during my care of the patient were reviewed by me and considered in my medical decision making (see chart  for details).    Pt is feeling much better after fluids, zofran, phenergan, and reglan.  Her potassium and magnesium were replaced.  She is tolerating po fluids.  Hr has improved. She She is instructed to f/u with her doctor and to return if worse.  Final Clinical Impressions(s) / ED Diagnoses   Final diagnoses:  Hypokalemia  Hypomagnesemia  Non-intractable vomiting with nausea, unspecified vomiting type    ED Discharge Orders         Ordered    promethazine (PHENERGAN) 25 MG tablet  Every 6 hours PRN     04/15/18 2326    potassium chloride  [place]  Daily     04/15/18 2326    magnesium oxide (MAG-OX) 400 MG tablet  Daily     04/15/18 2326           Isla Pence, MD 04/15/18 2426    Isla Pence, MD 04/15/18 2329

## 2018-04-15 NOTE — ED Triage Notes (Signed)
Pt reports tightness/pain in her chest and back, SOB, cough, sore throat, congestion, nausea, diarrhea, low potassium (no labs) x over 2 weeks

## 2018-04-15 NOTE — ED Notes (Signed)
ED Provider at bedside. 

## 2018-04-16 NOTE — ED Notes (Signed)
Reviewed d/c instructions with pt, who verbalized understanding and had no outstanding questions. Pt departed in NAD, refused use of wheelchair.   

## 2018-06-08 IMAGING — DX DG CHEST 2V
2 series · 2 of 2 positions shown · non-contrast
Comparison: Radiographs October 14, 2016.

CLINICAL DATA: Chest pain, productive cough.

EXAM:
CHEST  2 VIEW

[chest pa]
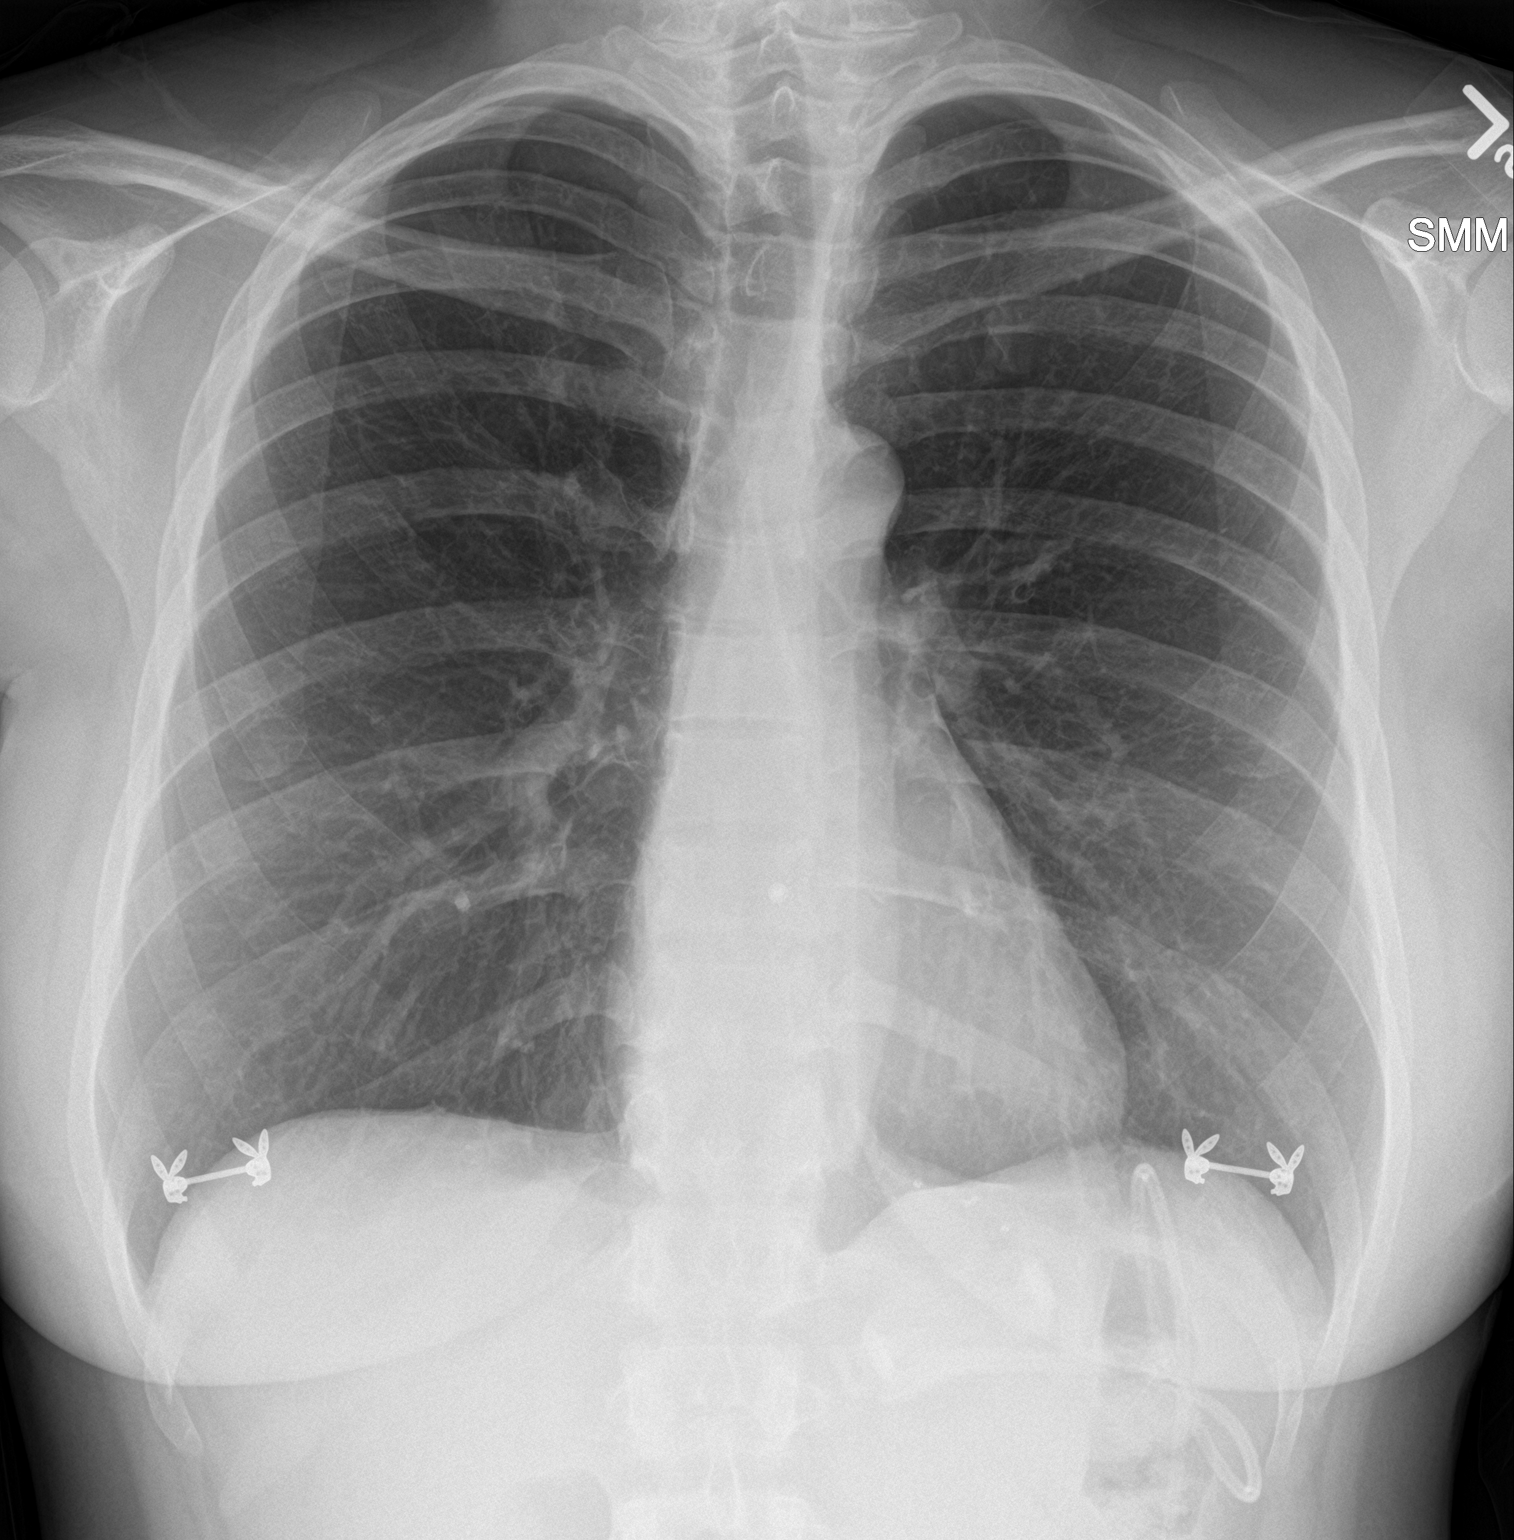

[chest lat]
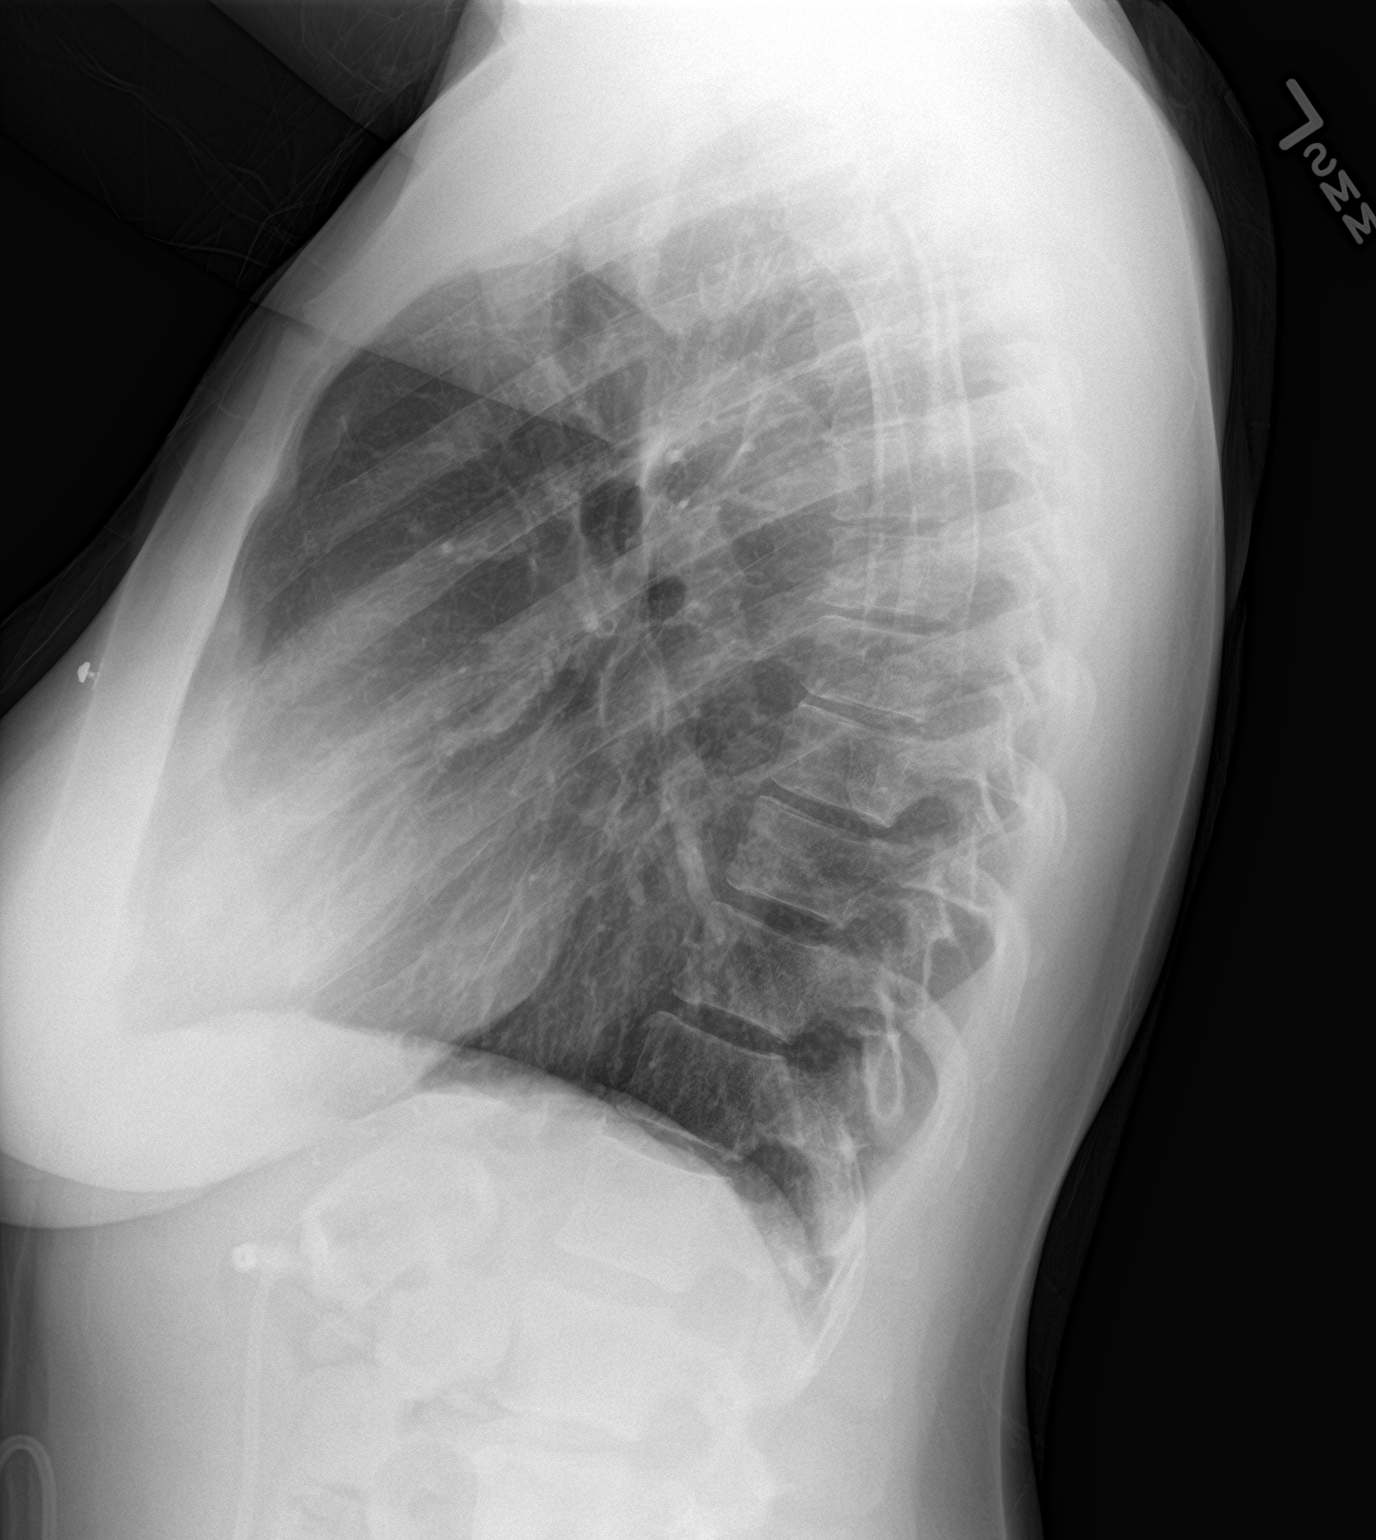

[2 of 2 positions shown; findings below may reference images not displayed]

FINDINGS: The heart size and mediastinal contours are within normal limits.
Both lungs are clear. No pneumothorax or pleural effusion is noted.
The visualized skeletal structures are unremarkable.
IMPRESSION: No active cardiopulmonary disease.

## 2018-06-09 DIAGNOSIS — R9082 White matter disease, unspecified: Secondary | ICD-10-CM | POA: Insufficient documentation

## 2018-06-10 ENCOUNTER — Encounter: Payer: Self-pay | Admitting: Infectious Disease

## 2018-06-10 ENCOUNTER — Inpatient Hospital Stay (HOSPITAL_COMMUNITY): Admission: RE | Admit: 2018-06-10 | Payer: Self-pay | Source: Ambulatory Visit

## 2018-06-10 ENCOUNTER — Ambulatory Visit: Payer: Managed Care, Other (non HMO) | Admitting: Neurology

## 2018-06-10 ENCOUNTER — Ambulatory Visit (INDEPENDENT_AMBULATORY_CARE_PROVIDER_SITE_OTHER): Payer: Managed Care, Other (non HMO) | Admitting: Infectious Disease

## 2018-06-10 VITALS — BP 133/91 | HR 102 | Temp 98.0°F | Wt 155.0 lb

## 2018-06-10 DIAGNOSIS — M542 Cervicalgia: Secondary | ICD-10-CM | POA: Diagnosis not present

## 2018-06-10 DIAGNOSIS — F329 Major depressive disorder, single episode, unspecified: Secondary | ICD-10-CM

## 2018-06-10 DIAGNOSIS — R112 Nausea with vomiting, unspecified: Secondary | ICD-10-CM | POA: Insufficient documentation

## 2018-06-10 DIAGNOSIS — R200 Anesthesia of skin: Secondary | ICD-10-CM | POA: Diagnosis not present

## 2018-06-10 DIAGNOSIS — R634 Abnormal weight loss: Secondary | ICD-10-CM | POA: Insufficient documentation

## 2018-06-10 DIAGNOSIS — F331 Major depressive disorder, recurrent, moderate: Secondary | ICD-10-CM

## 2018-06-10 DIAGNOSIS — N83209 Unspecified ovarian cyst, unspecified side: Secondary | ICD-10-CM | POA: Insufficient documentation

## 2018-06-10 DIAGNOSIS — Z6825 Body mass index (BMI) 25.0-25.9, adult: Secondary | ICD-10-CM

## 2018-06-10 DIAGNOSIS — R61 Generalized hyperhidrosis: Secondary | ICD-10-CM | POA: Diagnosis not present

## 2018-06-10 DIAGNOSIS — M797 Fibromyalgia: Secondary | ICD-10-CM

## 2018-06-10 DIAGNOSIS — R5383 Other fatigue: Secondary | ICD-10-CM | POA: Diagnosis not present

## 2018-06-10 DIAGNOSIS — R0789 Other chest pain: Secondary | ICD-10-CM

## 2018-06-10 DIAGNOSIS — Z86718 Personal history of other venous thrombosis and embolism: Secondary | ICD-10-CM

## 2018-06-10 HISTORY — DX: Nausea with vomiting, unspecified: R11.2

## 2018-06-10 HISTORY — DX: Abnormal weight loss: R63.4

## 2018-06-10 HISTORY — DX: Generalized hyperhidrosis: R61

## 2018-06-10 HISTORY — DX: Cervicalgia: M54.2

## 2018-06-10 HISTORY — DX: Other fatigue: R53.83

## 2018-06-10 NOTE — Progress Notes (Signed)
Subjective:    Patient ID: Tiffany [name], female    DOB: 1975-10-23, 43 y.o.   MRN: [id number]  Reason for infectious disease consultation: Myriad of symptoms but pertinent ones include night sweats and weight loss.  Requesting Physician: Bernerd Limbo MD    HPI   Ms Laneve is a 43 year old African-American woman the past medical history significant for an unprovoked deep venous thrombosis in 2016 for which she received 6 months of anticoagulation, history of major depression on treatment via psychiatrist also with ADHD and fibromyalgia.  She has a myriad of symptoms.  She has a history of problems with diffuse joint pains but more prominent in her wrists and knees along with neck stiffness.  She has been worked up by rheumatology and had a negative rheumatological work-up.  Hence 1 of the diagnoses is fibromyalgia.  She had a CPK level done which was negative as well.  She also describes problems with blurry vision headache lethargy memory loss nausea and vomiting.  She states that over the last 10 weeks she has lost 20 pounds.  She describes having night sweats at least 5 out of 7 nights per week in which her clothes are drenched in sweat and she has to change her clothes and sheets at night.  She also states that she has profound fatigue.  She describes also numbness in her arms and legs as well as intermittent back pain.  She describes pain in both of her legs but more prominently in her left leg where she previously had a deep venous thrombosis.  She also is stating that she has difficulty breathing with exertion at times and brief chest heaviness in the center of her chest that lasts for a few minutes and resolves after she lies down in bed.  She states that she has had several family members passed away in the last few years including a sister who died from a gynecological malignancy in the context of HIV, a brother who died of locations related to multiple sclerosis and a father and  grandfather who passed away.  She has several members of her family who have also suffered pulmonary emboli.  She is been worked up as mentioned by her care physician as well as rheumatologist.  Apparently her psychiatrist suggested that she would be tested for Lyme which was not an appropriate idea.  If you google the CDC's website it explicitly states that people with chronic nonspecific symptoms should not be treated for Lyme disease.  In any case her screening Elisa was slightly positive but her Western blot showed that she does not have infection with Borrelia burgdorferi, as she only had one positive band out of 8 on Western blot for IgG (requries 5) and she only had 1 of 3 on IgM (requires 2/3 but NOT supposed to be done on someone with ssx > 1 month), other test that she had done included testing for hepatitis A which was negative by IgM hepatitis B surface antigen and core antibody which were negative hepatitis C antibody which was negative she tested negative for influenza in December 2019.  Her thyroid-stimulating hormone was normal her HIV test was negative her syphilis RPR was negative gonorrhea and Chlamydia tests were negative comprehensive metabolic panel was completely normal slightly low potassium level.  Her urine analysis was negative her sed rate was 3 her CBC with differential showed normal white count slight normocytic anemia    Past Medical History:  Diagnosis Date  . Abnormal Pap  smear 1997   LAST PAP 10/2010  . Allergy    SEASONAL  . Anemia    CHRONIC  . 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 Arthritis   . Blood dyscrasia    SICKLE CELL TRAIT  . Blood transfusion without reported diagnosis 2005   after vaginal delivery  . Breast mass in female 2003  . Broken foot 03/16/2016   right  . Bruises easily   . BV (bacterial vaginosis)   . Cyst of ovary 2003  . Depression 2003  . DUB (dysfunctional uterine bleeding) 2003  . DVT, lower extremity ([place]) 10/2014   left leg    . Fatigue 06/10/2018  . Fibroid    LONG TERM DX  . Fibromyalgia 2004  . Fibrositis   . GERD (gastroesophageal reflux disease) 2007  . H/O amenorrhea 2006  . H/O menorrhagia 2011  . H/O sickle cell trait   . Heart murmur 2007  . History of abnormal cervical Pap smear 2003   had colposcopy and cryo.  Follow up paps are normal.  . Hx: UTI (urinary tract infection) 2003  . Hypertension 2005   RESOLVED WITH WT LOSS  . IBD (inflammatory bowel disease)   . IBS (irritable bowel syndrome)   . Infection    OCC YEAST  . Low back pain 2003  . Menses, irregular 2003  . Migraine   . Nausea and vomiting 06/10/2018  . Neck pain 06/10/2018  . Night sweats 06/10/2018  . Obese 2003  . Pulmonary embolism ([place]) 10/2014  . Sacroiliitis ([place])   . Sickle-cell trait ([place])   . Trichomonas   . Uterine leiomyoma   . Vaginitis and vulvovaginitis 2004  . Weight loss 06/10/2018  . Yeast vaginitis 04/24/2010    Past Surgical History:  Procedure Laterality Date  . AUGMENTATION MAMMAPLASTY  2706   Silicone implants  . broken ankle Right   . CERVICAL CONE BIOPSY  1997  . COSMETIC SURGERY  2012   thigh lift  . CYSTECTOMY Right    obtain date from patient, was not on history form dated 06/16/10--benign cyst removed Rt.[place]  . DILATION AND CURETTAGE OF UTERUS    . LAPAROSCOPIC GASTRIC BANDING  05/20/2007  . WISDOM TOOTH EXTRACTION      Family History  Problem Relation Age of Onset  . Diabetes Mother   . Arthritis Mother   . Hyperlipidemia Mother   . Other Mother        VARICOSE VEINS; DECREASED HEART RATE; +Hysterectomy for unspecified reason  . Fibromyalgia Mother   . Colon polyps Mother        unspecified number  . Depression Mother   . Heart disease Father   . Hypertension Father   . Alcohol abuse Father   . Drug abuse Father   . Stroke Father   . Ovarian cancer Sister        dx. <43y  . Cervical cancer Sister 69  . Multiple sclerosis Brother   . Thyroid disease Brother        hypothyroidism   . Lung cancer Maternal Grandmother 74       secondhand smoke  . Depression Maternal Grandmother   . Miscarriages / Stillbirths Maternal Grandmother   . Birth defects Maternal Aunt        EXTRA DIGITS; MUSCULAR DISEASE  . Early death Maternal Aunt 12  . COPD Maternal Grandfather   . Stroke Maternal Grandfather   . Ovarian cancer Maternal Aunt 73  Dec.age 66 Ovarian CA  . Early death Maternal Aunt        d. three days after birth; unknown cause  . Breast cancer Maternal Aunt 54  . Colon polyps Maternal Aunt        unspecified number  . Liver disease Paternal Aunt   . Ovarian cancer Maternal Aunt 38  . Depression Maternal Aunt   . Heart attack Paternal Uncle        in mid-50s  . Alcohol abuse Maternal Uncle   . Breast cancer Cousin        maternal      Social History   Socioeconomic History  . Marital status: Single    Spouse name: Not on file  . Number of children: 5  . Years of education: 67  . Highest education level: Not on file  Occupational History  . Occupation: CLINICAL RESEARCH  Social Needs  . Financial resource strain: Not on file  . Food insecurity:    Worry: Not on file    Inability: Not on file  . Transportation needs:    Medical: Not on file    Non-medical: Not on file  Tobacco Use  . Smoking status: Never Smoker  . Smokeless tobacco: Never Used  Substance and Sexual Activity  . Alcohol use: Yes    Alcohol/week: 1.0 standard drinks    Types: 1 Glasses of wine per week  . Drug use: No  . Sexual activity: Yes    Partners: Male    Birth control/protection: Condom  Lifestyle  . Physical activity:    Days per week: Not on file    Minutes per session: Not on file  . Stress: Not on file  Relationships  . Social connections:    Talks on phone: Not on file    Gets together: Not on file    Attends religious service: Not on file    Active member of club or organization: Not on file    Attends meetings of clubs or organizations: Not on file     Relationship status: Not on file  Other Topics Concern  . Not on file  Social History Narrative  . Not on file    Allergies  Allergen Reactions  . Flexeril [Cyclobenzaprine Hcl] Shortness Of Breath, Itching, Swelling and Rash    Swelling of throat  . Latex Shortness Of Breath, Itching and Rash  . Other Other (See Comments)    RUBBER     Current Outpatient Medications:  .  ALPRAZolam (XANAX) 1 MG tablet, Take 1 mg by mouth at bedtime as needed for anxiety., Disp: , Rfl:  .  amphetamine-dextroamphetamine (ADDERALL) 20 MG tablet, Take 20 mg by mouth daily. , Disp: , Rfl:  .  cetirizine (ZYRTEC) 10 MG tablet, Take 10 mg by mouth daily. , Disp: , Rfl:  .  fluticasone (FLONASE) 50 MCG/ACT nasal spray, Place 1 spray into both nostrils daily as needed for allergies. , Disp: , Rfl:  .  MYDAYIS 50 MG CP24, Take 50 mg by mouth daily. , Disp: , Rfl: 0 .  ondansetron (ZOFRAN) 4 MG tablet, Take 1 tablet (4 mg total) by mouth every 8 (eight) hours as needed for nausea or vomiting., Disp: 10 tablet, Rfl: 0 .  potassium chloride 20 MEQ TBCR, Take 20 mEq by mouth daily., Disp: 14 tablet, Rfl: 0 .  promethazine (PHENERGAN) 25 MG tablet, Take 1 tablet (25 mg total) by mouth every 6 (six) hours as needed for nausea or vomiting.,  Disp: 10 tablet, Rfl: 0 .  vortioxetine HBr (TRINTELLIX) 10 MG TABS tablet, Take 10 mg by mouth daily., Disp: , Rfl:  .  zolpidem (AMBIEN) 10 MG tablet, Take 10 mg by mouth at bedtime., Disp: , Rfl:  .  acetaminophen (TYLENOL) 500 MG tablet, Take 1 tablet (500 mg total) by mouth every 6 (six) hours as needed. (Patient not taking: Reported on 06/10/2018), Disp: 30 tablet, Rfl: 0 .  ALPRAZolam (XANAX XR) 1 MG 24 hr tablet, Take 1 mg by mouth as needed for anxiety., Disp: , Rfl:  .  betamethasone valerate ointment (VALISONE) 0.1 %, Apply a pea sized amount topically BID for 1-2 weeks as needed. Not for daily long term use. (Patient not taking: Reported on 04/15/2018), Disp: 15 g, Rfl:  0 .  lubiprostone (AMITIZA) 24 MCG capsule, Take 1 capsule (24 mcg total) by mouth daily with breakfast. (Patient not taking: Reported on 04/15/2018), Disp: 30 capsule, Rfl: 5 .  magnesium oxide (MAG-OX) 400 MG tablet, Take 1 tablet (400 mg total) by mouth daily. (Patient not taking: Reported on 06/10/2018), Disp: 14 tablet, Rfl: 0 .  nystatin cream (MYCOSTATIN), Apply 1 application topically 2 (two) times daily. Apply to affected area BID for up to 7 days. (Patient not taking: Reported on 04/15/2018), Disp: 30 g, Rfl: 0   Review of Systems  Constitutional: Positive for activity change, diaphoresis, fatigue and unexpected weight change. Negative for appetite change, chills and fever.  HENT: Negative for congestion, rhinorrhea, sinus pressure, sneezing, sore throat and trouble swallowing.   Eyes: Negative for photophobia and visual disturbance.  Respiratory: Positive for shortness of breath. Negative for cough, chest tightness, wheezing and stridor.   Cardiovascular: Positive for chest pain, palpitations and leg swelling.  Gastrointestinal: Positive for nausea and vomiting. Negative for abdominal distention, abdominal pain, anal bleeding, blood in stool, constipation and diarrhea.  Genitourinary: Negative for difficulty urinating, dysuria, flank pain and hematuria.  Musculoskeletal: Positive for arthralgias, back pain, joint swelling, myalgias, neck pain and neck stiffness. Negative for gait problem.  Skin: Negative for color change, pallor, rash and wound.  Neurological: Positive for weakness, light-headedness, numbness and headaches. Negative for dizziness, tremors and seizures.  Hematological: Negative for adenopathy. Does not bruise/bleed easily.  Psychiatric/Behavioral: Positive for confusion, decreased concentration and dysphoric mood. Negative for agitation, behavioral problems, hallucinations, self-injury and sleep disturbance. The patient is nervous/anxious. The patient is not hyperactive.         Objective:   Physical Exam Constitutional:      General: She is not in acute distress.    Appearance: She is not ill-appearing or diaphoretic.  HENT:     Head: Normocephalic and atraumatic.     Right Ear: External ear normal.     Left Ear: External ear normal.     Nose: Nose normal.     Mouth/Throat:     Mouth: Mucous membranes are dry.     Pharynx: Oropharynx is clear. No oropharyngeal exudate.  Eyes:     General: No scleral icterus.    Extraocular Movements: Extraocular movements intact.     Conjunctiva/sclera: Conjunctivae normal.     Pupils: Pupils are equal, round, and reactive to light.  Neck:     Musculoskeletal: Normal range of motion and neck supple.  Cardiovascular:     Rate and Rhythm: Normal rate and regular rhythm.     Heart sounds: Normal heart sounds. No murmur. No friction rub. No gallop.   Pulmonary:     Effort: Pulmonary  effort is normal. No respiratory distress.     Breath sounds: Normal breath sounds. No stridor. No wheezing, rhonchi or rales.  Abdominal:     General: Abdomen is flat. Bowel sounds are normal. There is no distension.     Palpations: Abdomen is soft. There is no mass.     Tenderness: There is no abdominal tenderness.  Musculoskeletal: Normal range of motion.        General: No swelling, tenderness, deformity or signs of injury.     Right lower leg: No edema.     Left lower leg: No edema.  Lymphadenopathy:     Head:     Right side of head: No submental, submandibular, tonsillar, preauricular, posterior auricular or occipital adenopathy.     Left side of head: No submental, submandibular, tonsillar, preauricular, posterior auricular or occipital adenopathy.     Cervical: No cervical adenopathy.     Right cervical: No superficial, deep or posterior cervical adenopathy.    Left cervical: No superficial or deep cervical adenopathy.     Upper Body:     Right upper body: No supraclavicular adenopathy.     Left upper body: No  supraclavicular adenopathy.  Skin:    General: Skin is warm and dry.     Coloration: Skin is not pale.     Findings: No erythema or rash.  Neurological:     General: No focal deficit present.     Mental Status: She is alert and oriented to person, place, and time.     Coordination: Coordination normal.  Psychiatric:        Attention and Perception: Attention normal.        Mood and Affect: Mood is depressed.        Speech: Speech normal.        Behavior: Behavior normal.        Thought Content: Thought content normal.        Cognition and Memory: Cognition and memory normal.        Judgment: Judgment normal.          Assessment & Plan:   Night sweats weight loss and myriad of other symptoms occluding fatigue, also with neck pain and numbness in both arms  Explained to her that chronic infectious diseases do not typically cause fatigue with a few exceptions such as HIV and AIDS which she has tested negative for.  There do exist entities where patients develop fatigue after an infectious disease such as Epstein-Barr CMV and also Borrelia burgdorferi --which she does not have, long others.  We can certainly test for Epstein-Barr virus and CMV and she likely already has had these infections given the fact she is 43 years old.  There will be no indication for any type of treatment though for that.  Given the night sweats and weight loss I will make sure that we have had a QuantiFERON gold sent off.  Also check a serum LDH  I am skeptical that she would have something like cervical spine discitis but given her complaints of neck pain and her desire to "look at everything" we will order an MRI of the cervical spine with and without contrast to exclude disc disease and infection there.  Does not have sufficient symptoms in her lower back to warrant imaging the lower spine with 4 investigating for discitis or paraspinal abscess.  I expect the majority of her symptoms are due to her  severe depression and anxiety with other diagnoses such as  fibromyalgia being involved and potentially a somatization disorder.  Clearly she needs the best most supportive care through her psychiatrist and her primary care physician.  I will bring her back in 1 month's time review her labs.  History of deep venous thrombosis with left lower leg pain without swelling though.  Her pulse oxygenation was fine.  We will get a Doppler of her lower extremities for thoroughness and I have ordered a d-dimer though I cautioned her that the d-dimer was positive positive we may very well be asking her to come to the emergency room room for CT scan of the chest to rule out pulmonary embolism.  She is okay with that and reminded me that she had several family members who had pulmonary emboli  I spent greater than 80 minutes with the patient including greater than 50% of time in face to face counsel of the patient re her myriad symptoms and differential diagnoses for each 1 of them and in coordination of her care.  Beyond that with an absence of fever there is not much really to chase from a focal standpoint.  If she had more prominent back pain I would be

## 2018-06-11 ENCOUNTER — Encounter (HOSPITAL_COMMUNITY): Payer: Self-pay

## 2018-06-11 ENCOUNTER — Other Ambulatory Visit (HOSPITAL_COMMUNITY): Payer: Self-pay | Admitting: Infectious Disease

## 2018-06-11 ENCOUNTER — Emergency Department (HOSPITAL_BASED_OUTPATIENT_CLINIC_OR_DEPARTMENT_OTHER): Payer: Managed Care, Other (non HMO)

## 2018-06-11 ENCOUNTER — Emergency Department (HOSPITAL_COMMUNITY): Payer: Managed Care, Other (non HMO)

## 2018-06-11 ENCOUNTER — Emergency Department (HOSPITAL_COMMUNITY): Payer: Self-pay

## 2018-06-11 ENCOUNTER — Telehealth: Payer: Self-pay

## 2018-06-11 ENCOUNTER — Emergency Department (HOSPITAL_COMMUNITY)
Admission: EM | Admit: 2018-06-11 | Discharge: 2018-06-11 | Disposition: A | Discharge status: Home / Self Care | Payer: Managed Care, Other (non HMO) | Attending: Emergency Medicine | Admitting: Emergency Medicine

## 2018-06-11 ENCOUNTER — Telehealth (HOSPITAL_COMMUNITY): Payer: Self-pay | Admitting: *Deleted

## 2018-06-11 ENCOUNTER — Other Ambulatory Visit: Payer: Self-pay | Admitting: Infectious Disease

## 2018-06-11 ENCOUNTER — Other Ambulatory Visit: Payer: Self-pay

## 2018-06-11 ENCOUNTER — Telehealth: Payer: Self-pay | Admitting: Infectious Disease

## 2018-06-11 DIAGNOSIS — R7889 Finding of other specified substances, not normally found in blood: Secondary | ICD-10-CM | POA: Diagnosis present

## 2018-06-11 DIAGNOSIS — Z86718 Personal history of other venous thrombosis and embolism: Secondary | ICD-10-CM | POA: Insufficient documentation

## 2018-06-11 DIAGNOSIS — Z91040 Latex allergy status: Secondary | ICD-10-CM | POA: Insufficient documentation

## 2018-06-11 DIAGNOSIS — Z79899 Other long term (current) drug therapy: Secondary | ICD-10-CM | POA: Diagnosis not present

## 2018-06-11 DIAGNOSIS — R0602 Shortness of breath: Secondary | ICD-10-CM | POA: Diagnosis not present

## 2018-06-11 DIAGNOSIS — R600 Localized edema: Secondary | ICD-10-CM

## 2018-06-11 DIAGNOSIS — I10 Essential (primary) hypertension: Secondary | ICD-10-CM | POA: Insufficient documentation

## 2018-06-11 LAB — CBC WITH DIFFERENTIAL/PLATELET
Abs Immature Granulocytes: 0.01 10*3/uL (ref 0.00–0.07)
Basophils Absolute: 0 10*3/uL (ref 0.0–0.1)
Basophils Relative: 1 %
EOS PCT: 2 %
Eosinophils Absolute: 0.1 10*3/uL (ref 0.0–0.5)
HCT: 38 % (ref 36.0–46.0)
Hemoglobin: 12.6 g/dL (ref 12.0–15.0)
Immature Granulocytes: 0 %
LYMPHS ABS: 2.2 10*3/uL (ref 0.7–4.0)
Lymphocytes Relative: 39 %
MCH: 28 pg (ref 26.0–34.0)
MCHC: 33.2 g/dL (ref 30.0–36.0)
MCV: 84.4 fL (ref 80.0–100.0)
Monocytes Absolute: 0.6 10*3/uL (ref 0.1–1.0)
Monocytes Relative: 10 %
NRBC: 0 % (ref 0.0–0.2)
Neutro Abs: 2.6 10*3/uL (ref 1.7–7.7)
Neutrophils Relative %: 48 %
Platelets: 456 10*3/uL — ABNORMAL HIGH (ref 150–400)
RBC: 4.5 MIL/uL (ref 3.87–5.11)
RDW: 14.8 % (ref 11.5–15.5)
WBC: 5.6 10*3/uL (ref 4.0–10.5)

## 2018-06-11 LAB — COMPREHENSIVE METABOLIC PANEL
ALT: 11 U/L (ref 0–44)
AST: 16 U/L (ref 15–41)
Albumin: 3.8 g/dL (ref 3.5–5.0)
Alkaline Phosphatase: 58 U/L (ref 38–126)
Anion gap: 10 (ref 5–15)
BUN: <5 mg/dL — ABNORMAL LOW (ref 6–20)
CALCIUM: 8.7 mg/dL — AB (ref 8.9–10.3)
CO2: 26 mmol/L (ref 22–32)
Chloride: 102 mmol/L (ref 98–111)
Creatinine, Ser: 0.84 mg/dL (ref 0.44–1.00)
GFR calc Af Amer: >60 mL/min (ref >60)
GFR calc non Af Amer: >60 mL/min (ref >60)
Glucose, Bld: 116 mg/dL — ABNORMAL HIGH (ref 70–99)
Potassium: 2.5 mmol/L — CL (ref 3.5–5.1)
Sodium: 138 mmol/L (ref 135–145)
Total Bilirubin: 0.4 mg/dL (ref 0.3–1.2)
Total Protein: 6.9 g/dL (ref 6.5–8.1)

## 2018-06-11 LAB — D-DIMER, QUANTITATIVE: D-Dimer, Quant: 1.41 ug/mL-FEU — ABNORMAL HIGH (ref 0.00–0.50)

## 2018-06-11 MED ORDER — POTASSIUM CHLORIDE ER 20 MEQ PO TBCR: 40.0000 meq | EXTENDED_RELEASE_TABLET | Freq: Every day | ORAL | 0 refills | Status: DC

## 2018-06-11 MED ORDER — POTASSIUM CHLORIDE CRYS ER 20 MEQ PO TBCR
40.0000 meq | EXTENDED_RELEASE_TABLET | Freq: Once | ORAL | Status: AC
  Administered 2018-06-11: 40 meq via ORAL
  Filled 2018-06-11: qty 2

## 2018-06-11 MED ORDER — LEVOFLOXACIN 500 MG PO TABS
500.0000 mg | ORAL_TABLET | Freq: Once | ORAL | Status: AC
  Administered 2018-06-11: 500 mg via ORAL
  Filled 2018-06-11: qty 1

## 2018-06-11 MED ORDER — LEVOFLOXACIN 500 MG PO TABS: 500.0000 mg | ORAL_TABLET | Freq: Every day | ORAL | 0 refills | Status: DC

## 2018-06-11 MED ORDER — FLUCONAZOLE 150 MG PO TABS: 150.0000 mg | ORAL_TABLET | Freq: Every day | ORAL | 0 refills | Status: DC

## 2018-06-11 MED ORDER — IOHEXOL 350 MG/ML SOLN
100.0000 mL | Freq: Once | INTRAVENOUS | Status: AC | PRN
  Administered 2018-06-11: 60 mL via INTRAVENOUS

## 2018-06-11 NOTE — ED Notes (Signed)
Pt transported by [facility] [place].

## 2018-06-11 NOTE — Telephone Encounter (Signed)
06/11/18 spoke w/pt advised her she may request this test to be done in the hospital.

## 2018-06-11 NOTE — Telephone Encounter (Signed)
Thx so much Sharyn Lull!

## 2018-06-11 NOTE — Telephone Encounter (Signed)
-----   Message from Truman Hayward, MD sent at 06/10/2018  4:55 PM EST ----- Pt should followup with PCP re low K

## 2018-06-11 NOTE — ED Triage Notes (Signed)
Pt was at PCP yesterday and had labs drawn. Was called back today for positive D dimer. Pt endorses SOB, night sweats, N&V dizziness for months. Pt lost 30lbs in 2 months

## 2018-06-11 NOTE — ED Notes (Signed)
E-signature not available, verbalized understanding of DC instructions and prescriptions.  

## 2018-06-11 NOTE — Telephone Encounter (Signed)
Patient with prior history of unprovoked DVT, unexplained atypical chest pain and HR>100 with positive D Dimer  She needs to get a CT angiogram in addition to the [place] I ordered her yesterday  I can order a CTA but unless it can be done today with the duplex it is best she goes to ER for CTA, duplex and if she has DVT or pE initiation of treatment for DVT, PE

## 2018-06-11 NOTE — Discharge Instructions (Addendum)
Please recheck with your doctor tomorrow Take potassium as discussed TAke antibiotics as discussed

## 2018-06-11 NOTE — ED Provider Notes (Signed)
[facility] EMERGENCY DEPARTMENT Provider Note   CSN: [id number] Arrival date & time: 06/11/18  1315    History   Chief Complaint Chief Complaint  Patient presents with  . Abnormal Lab    HPI Tiffany [name] is a 43 y.o. female.     HPI  43 year old female with history of anxiety, DVT, sickle cell trait presents today after being seen and worked up in infectious disease.  They report no specific infectious disease, but during their evaluation obtained a d-dimer that was elevated.  She was noted to be tachycardic in the office at 115.  When the d-dimer came back positive, she was called and told to come to the ED for CT angiogram.  Patient states that her heart rate is always elevated.  She has had some dyspnea with exertion for an extended period of time.  She has had an unprovoked DVT in the past that was asymptomatic.  She denies any lateralized swelling or other DVT risk factors at this time.  She reports that her potassium has been running low and she is taking oral potassium.  She endorses that she has had some weight loss and night sweats.  This is been evaluated by her primary care doctor and infectious disease consult.  Past Medical History:  Diagnosis Date  . Abnormal Pap smear 1997   LAST PAP 10/2010  . Allergy    SEASONAL  . Anemia    CHRONIC  . 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 Arthritis   . Blood dyscrasia    SICKLE CELL TRAIT  . Blood transfusion without reported diagnosis 2005   after vaginal delivery  . Breast mass in female 2003  . Broken foot 03/16/2016   right  . Bruises easily   . BV (bacterial vaginosis)   . Cyst of ovary 2003  . Depression 2003  . DUB (dysfunctional uterine bleeding) 2003  . DVT, lower extremity ([place]) 10/2014   left leg  . Fatigue 06/10/2018  . Fibroid    LONG TERM DX  . Fibromyalgia 2004  . Fibrositis   . GERD (gastroesophageal reflux disease) 2007  . H/O amenorrhea 2006  . H/O menorrhagia 2011  . H/O sickle  cell trait   . Heart murmur 2007  . History of abnormal cervical Pap smear 2003   had colposcopy and cryo.  Follow up paps are normal.  . Hx: UTI (urinary tract infection) 2003  . Hypertension 2005   RESOLVED WITH WT LOSS  . IBD (inflammatory bowel disease)   . IBS (irritable bowel syndrome)   . Infection    OCC YEAST  . Low back pain 2003  . Menses, irregular 2003  . Migraine   . Nausea and vomiting 06/10/2018  . Neck pain 06/10/2018  . Night sweats 06/10/2018  . Obese 2003  . Pulmonary embolism ([place]) 10/2014  . Sacroiliitis ([place])   . Sickle-cell trait ([place])   . Trichomonas   . Uterine leiomyoma   . Vaginitis and vulvovaginitis 2004  . Weight loss 06/10/2018  . Yeast vaginitis 04/24/2010    Patient Active Problem List   Diagnosis Date Noted  . Cyst of ovary 06/10/2018  . Weight loss 06/10/2018  . Neck pain 06/10/2018  . Night sweats 06/10/2018  . Fatigue 06/10/2018  . Nausea and vomiting 06/10/2018  . ASCUS with positive high risk HPV cervical 12/17/2017  . Anemia 11/27/2017  . Palpitations 11/28/2016  . Vitamin D deficiency 11/28/2016  .  Abdominal bloating 12/27/2015  . Abnormal weight gain 12/23/2015  . Peripheral edema 11/22/2015  . Atypical chest pain 11/10/2015  . Gastric polyps 11/10/2015  . Hypokalemia 08/15/2015  . Seasonal allergic rhinitis due to pollen 07/05/2015  . Sickle cell trait ([place]) 07/05/2015  . Genetic testing 06/13/2015  . Family history of breast cancer 06/02/2015  . Family history of malignant neoplasm of ovary 06/02/2015  . BV (bacterial vaginosis) 02/18/2015  . Hemorrhoids 02/18/2015  . Fibrositis 11/30/2014  . Left leg DVT ([place]) 11/18/2014  . History of DVT of lower extremity 11/18/2014  . Left leg pain 11/09/2014  . History of laparoscopic adjustable gastric banding 01/29/2012  . Depression 12/19/2011  . Fibromyalgia 12/19/2011  . Arthritis 12/19/2011  . Irritable bowel syndrome 12/19/2011  . Gastroesophageal reflux disease 12/19/2011    . Uterine leiomyoma 12/19/2011  . Current moderate episode of major depressive disorder without prior episode ([place]) 12/19/2011  . Undifferentiated inflammatory arthritis ([place]) 12/19/2011  . Abnormal cervical Papanicolaou smear 04/10/1995    Past Surgical History:  Procedure Laterality Date  . AUGMENTATION MAMMAPLASTY  6295   Silicone implants  . broken ankle Right   . CERVICAL CONE BIOPSY  1997  . COSMETIC SURGERY  2012   thigh lift  . CYSTECTOMY Right    obtain date from patient, was not on history form dated 06/16/10--benign cyst removed Rt.ovary  . DILATION AND CURETTAGE OF UTERUS    . LAPAROSCOPIC GASTRIC BANDING  05/20/2007  . WISDOM TOOTH EXTRACTION       OB History    Gravida  12   Para  6   Term  6   Preterm      AB  6   Living  6     SAB  6   TAB      Ectopic      Multiple      Live Births  6            Home Medications    Prior to Admission medications   Medication Sig Start Date End Date Taking? Authorizing Provider  acetaminophen (TYLENOL) 500 MG tablet Take 1 tablet (500 mg total) by mouth every 6 (six) hours as needed. Patient not taking: Reported on 06/10/2018 10/14/16   Frederica Kuster, PA-C  ALPRAZolam (XANAX XR) 1 MG 24 hr tablet Take 1 mg by mouth as needed for anxiety.    [provider]  ALPRAZolam Duanne Moron) 1 MG tablet Take 1 mg by mouth at bedtime as needed for anxiety.    [provider]  amphetamine-dextroamphetamine (ADDERALL) 20 MG tablet Take 20 mg by mouth daily.  06/19/16   [provider]  betamethasone valerate ointment (VALISONE) 0.1 % Apply a pea sized amount topically BID for 1-2 weeks as needed. Not for daily long term use. Patient not taking: Reported on 04/15/2018 04/30/17   Salvadore Dom, MD  cetirizine (ZYRTEC) 10 MG tablet Take 10 mg by mouth daily.  09/07/15   [provider]  fluticasone (FLONASE) 50 MCG/ACT nasal spray Place 1 spray into both nostrils daily as needed for  allergies.  10/12/15   [provider]  lubiprostone (AMITIZA) 24 MCG capsule Take 1 capsule (24 mcg total) by mouth daily with breakfast. Patient not taking: Reported on 04/15/2018 12/09/14   Boykin Nearing, MD  magnesium oxide (MAG-OX) 400 MG tablet Take 1 tablet (400 mg total) by mouth daily. Patient not taking: Reported on 06/10/2018 04/15/18   Isla Pence, MD  MYDAYIS  50 MG CP24 Take 50 mg by mouth daily.  04/23/16   [provider]  nystatin cream (MYCOSTATIN) Apply 1 application topically 2 (two) times daily. Apply to affected area BID for up to 7 days. Patient not taking: Reported on 04/15/2018 09/05/16   [name], CNM  ondansetron (ZOFRAN) 4 MG tablet Take 1 tablet (4 mg total) by mouth every 8 (eight) hours as needed for nausea or vomiting. 06/15/17   Zigmund Gottron, NP  potassium chloride 20 MEQ TBCR Take 20 mEq by mouth daily. 04/15/18   Isla Pence, MD  promethazine (PHENERGAN) 25 MG tablet Take 1 tablet (25 mg total) by mouth every 6 (six) hours as needed for nausea or vomiting. 04/15/18   Isla Pence, MD  vortioxetine HBr (TRINTELLIX) 10 MG TABS tablet Take 10 mg by mouth daily.    [provider]  zolpidem (AMBIEN) 10 MG tablet Take 10 mg by mouth at bedtime.    [provider]    Family History Family History  Problem Relation Age of Onset  . Diabetes Mother   . Arthritis Mother   . Hyperlipidemia Mother   . Other Mother        VARICOSE VEINS; DECREASED HEART RATE; +Hysterectomy for unspecified reason  . Fibromyalgia Mother   . Colon polyps Mother        unspecified number  . Depression Mother   . Heart disease Father   . Hypertension Father   . Alcohol abuse Father   . Drug abuse Father   . Stroke Father   . Ovarian cancer Sister        dx. <43y  . Cervical cancer Sister 37  . Multiple sclerosis Brother   . Thyroid disease Brother        hypothyroidism  . Lung cancer Maternal Grandmother 74       secondhand smoke  .  Depression Maternal Grandmother   . Miscarriages / Stillbirths Maternal Grandmother   . Birth defects Maternal Aunt        EXTRA DIGITS; MUSCULAR DISEASE  . Early death Maternal Aunt 56  . COPD Maternal Grandfather   . Stroke Maternal Grandfather   . Ovarian cancer Maternal Aunt 75       Dec.age 31 Ovarian CA  . Early death Maternal Aunt        d. three days after birth; unknown cause  . Breast cancer Maternal Aunt 39  . Colon polyps Maternal Aunt        unspecified number  . Liver disease Paternal Aunt   . Ovarian cancer Maternal Aunt 24  . Depression Maternal Aunt   . Heart attack Paternal Uncle        in mid-50s  . Alcohol abuse Maternal Uncle   . Breast cancer Cousin        maternal    Social History Social History   Tobacco Use  . Smoking status: Never Smoker  . Smokeless tobacco: Never Used  Substance Use Topics  . Alcohol use: Yes    Alcohol/week: 1.0 standard drinks    Types: 1 Glasses of wine per week  . Drug use: No     Allergies   Flexeril [cyclobenzaprine hcl]; Latex; and Other   Review of Systems Review of Systems  Constitutional: Positive for unexpected weight change.  HENT: Negative.   Eyes: Negative.   Cardiovascular: Negative.   Gastrointestinal: Negative.   Endocrine: Negative.   Genitourinary: Negative.   Musculoskeletal: Negative.   Neurological:  Negative.   Hematological: Negative.   All other systems reviewed and are negative.    Physical Exam Updated Vital Signs BP (!) 149/114 (BP Location: Left Arm)   Pulse (!) 115   Temp 98.2 F (36.8 C) (Oral)   Resp 18   Ht 1.676 m (5\' 6" )   Wt 68.5 kg   LMP 05/23/2018 (Approximate)   SpO2 98%   BMI 24.37 kg/m   Physical Exam   ED Treatments / Results  Labs (all labs ordered are listed, but only abnormal results are displayed) Labs Reviewed  COMPREHENSIVE METABOLIC PANEL - Abnormal; Notable for the following components:      Result Value   Potassium 2.5 (*)    Glucose, Bld  116 (*)    BUN <5 (*)    Calcium 8.7 (*)    All other components within normal limits  D-DIMER, QUANTITATIVE (NOT AT [facility]) - Abnormal; Notable for the following components:   D-Dimer, Quant 1.41 (*)    All other components within normal limits  CBC WITH DIFFERENTIAL/PLATELET - Abnormal; Notable for the following components:   Platelets 456 (*)    All other components within normal limits    EKG None  Radiology Dg Chest 2 View  Result Date: 06/11/2018 CLINICAL DATA:  Shortness of breath and positive D-dimer EXAM: CHEST - 2 VIEW COMPARISON:  04/15/2018 FINDINGS: Cardiac shadows within normal limits. The lungs are well aerated bilaterally. Multiple piercings are seen. Gastric lap band is noted and stable. No infiltrate or effusion is seen. No bony abnormality is noted. IMPRESSION: No active cardiopulmonary disease. Electronically Signed   By: Inez Catalina M.D.   On: 06/11/2018 15:53   Ct Angio Chest Pe W And/or Wo Contrast  Result Date: 06/11/2018 CLINICAL DATA:  PE suspected, intermediate prob, positive D-dimer. Chest pain and shortness of breath. History of left leg DVT. EXAM: CT ANGIOGRAPHY CHEST WITH CONTRAST TECHNIQUE: Multidetector CT imaging of the chest was performed using the standard protocol during bolus administration of intravenous contrast. Multiplanar CT image reconstructions and MIPs were obtained to evaluate the vascular anatomy. CONTRAST:  41mL OMNIPAQUE IOHEXOL 350 MG/ML SOLN COMPARISON:  Chest radiograph earlier this day. FINDINGS: Cardiovascular: There are no filling defects within the pulmonary arteries to suggest pulmonary embolus. Thoracic aorta is normal in caliber without dissection. Common origin of the brachiocephalic and left common carotid artery, normal variant arch anatomy. Heart is normal in size. No pericardial effusion. Mediastinum/Nodes: Esophagus is markedly distended containing intraluminal fluid to the level of the thoracic inlet. Gastric band in place with  small subdiaphragmatic gastric pouch. No adenopathy. Visualized thyroid gland is normal. Lungs/Pleura: Scattered patchy ground-glass opacities within the left upper lobe and lingula consistent with pneumonitis. Lungs otherwise clear. No confluent airspace disease. No pulmonary edema or pleural fluid. Trachea and mainstem bronchi are patent. Upper Abdomen: Gastric band in place with small subdiaphragmatic gastric pouch and thickening of the gastroesophageal junction. Musculoskeletal: There are no acute or suspicious osseous abnormalities. Bilateral nipple rings and dermal piercing in the midline. Review of the MIP images confirms the above findings. IMPRESSION: 1. No pulmonary embolus. 2. Patchy ground-glass opacities in the left upper lobe and lingula consistent with pneumonitis, likely infectious or inflammatory. 3. Markedly distended esophagus containing intraluminal fluid to the level of the thoracic inlet. Gastric band in place with small subdiaphragmatic gastric pouch and thickening of the gastroesophageal junction. Over distention of the gastric band is considered. Electronically Signed   By: Aurther Loft.D.  On: 06/11/2018 19:27   Vas [place] Lower Extremity Venous (dvt) (mc And Wl 7a-7p)  Result Date: 06/11/2018  Lower Venous Study Indications: SOB.  Performing Technologist: Abram Sander RVS  Examination Guidelines: A complete evaluation includes B-mode imaging, spectral Doppler, color Doppler, and power Doppler as needed of all accessible portions of each vessel. Bilateral testing is considered an integral part of a complete examination. Limited examinations for reoccurring indications may be performed as noted.  Right Venous Findings: +---------+---------------+---------+-----------+----------+--------------+          CompressibilityPhasicitySpontaneityPropertiesSummary        +---------+---------------+---------+-----------+----------+--------------+ CFV      Full           Yes      Yes                                  +---------+---------------+---------+-----------+----------+--------------+ SFJ      Full                                                        +---------+---------------+---------+-----------+----------+--------------+ FV Prox  Full                                                        +---------+---------------+---------+-----------+----------+--------------+ FV Mid   Full                                                        +---------+---------------+---------+-----------+----------+--------------+ FV DistalFull                                                        +---------+---------------+---------+-----------+----------+--------------+ PFV      Full                                                        +---------+---------------+---------+-----------+----------+--------------+ POP      Full           Yes      Yes                                 +---------+---------------+---------+-----------+----------+--------------+ PTV      Full                                                        +---------+---------------+---------+-----------+----------+--------------+ PERO  Not visualized +---------+---------------+---------+-----------+----------+--------------+  Left Venous Findings: +---------+---------------+---------+-----------+----------+-------+          CompressibilityPhasicitySpontaneityPropertiesSummary +---------+---------------+---------+-----------+----------+-------+ CFV      Full           Yes      Yes                          +---------+---------------+---------+-----------+----------+-------+ SFJ      Full                                                 +---------+---------------+---------+-----------+----------+-------+ FV Prox  Full                                                  +---------+---------------+---------+-----------+----------+-------+ FV Mid   Full                                                 +---------+---------------+---------+-----------+----------+-------+ FV DistalFull                                                 +---------+---------------+---------+-----------+----------+-------+ PFV      Full                                                 +---------+---------------+---------+-----------+----------+-------+ POP      Full           Yes      Yes                          +---------+---------------+---------+-----------+----------+-------+ PTV      Full                                                 +---------+---------------+---------+-----------+----------+-------+ PERO                                                  Acute   +---------+---------------+---------+-----------+----------+-------+    Summary: Right: There is no evidence of deep vein thrombosis in the lower extremity. No cystic structure found in the popliteal fossa. Left: There is no evidence of deep vein thrombosis in the lower extremity. No cystic structure found in the popliteal fossa.  *See table(s) above for measurements and observations.    Preliminary     Procedures Procedures (including critical care time)  Medications Ordered in ED Medications  potassium chloride SA (K-DUR,KLOR-CON) CR tablet 40 mEq (has no administration in time range)     Initial Impression / Assessment and Plan /  ED Course  I have reviewed the triage vital signs and the nursing notes.  Pertinent labs & imaging results that were available during my care of the patient were reviewed by me and considered in my medical decision making (see chart for details).       Patient sent to ED due to elevated d-dimer with ho dvt.  Patient without dvt or evidence of pe, but ct does show some ground glass appearance with possible pneumonitis or infection. Also fluid in esophagus  with prior gastric banding in place Plan abx for possible pneumonia F/U with primary and ID for her pneumonia and gastric banding esophageal distension Patient with hypokalemia- review of records reveals multiple similar low potassium.  Pharmacy discussed with patient need to take meds and restart magnesium  Final Clinical Impressions(s) / ED Diagnoses   Final diagnoses:  SOB (shortness of breath)    ED Discharge Orders    None       Pattricia Boss, MD 06/11/18 [phone]

## 2018-06-11 NOTE — Progress Notes (Signed)
Lower extremity venous has been completed.   Preliminary results in CV Proc.   Abram Sander 06/11/2018 5:02 PM

## 2018-06-11 NOTE — Telephone Encounter (Signed)
Called patient regarding low potassium levels. Patient was able to take my call, and states that she will reach our to her PCP regarding this lab value.Patient did not have any questions regarding lab work. [place]

## 2018-06-11 NOTE — Telephone Encounter (Addendum)
Relayed directions to patient. Tiffany [name] verbalized understanding, agreement, and will go to [facility] ER for evaluation of DVT/PE.   Tiffany [name] wanted to make sure Dr Tommy Medal knew Tiffany [name] has had a history of abnormal ECG's in the past. Tiffany [name] will also relay this information while at ER. Landis Gandy, RN

## 2018-06-12 ENCOUNTER — Telehealth: Payer: Self-pay

## 2018-06-12 NOTE — Telephone Encounter (Signed)
Patient called wanting to make Dr. Tommy Medal aware that she was placed on antibiotics for  Pneumonia. Per patient her results showed no PE or DVT. Patient would appreciate a call form Dr. Tommy Medal with any new advise. LPN also advised patient to follow up with PCP.  Eugenia Mcalpine, LPN

## 2018-06-12 NOTE — Telephone Encounter (Signed)
Just spoke with her over the phone I am underwhelmed by the CT but she can take the levaquin if she wishes but only for 5 days

## 2018-06-13 LAB — COMPLETE METABOLIC PANEL WITH GFR
AG Ratio: 1.5 (calc) (ref 1.0–2.5)
ALT: 8 U/L (ref 6–29)
AST: 12 U/L (ref 10–30)
Albumin: 3.7 g/dL (ref 3.6–5.1)
Alkaline phosphatase (APISO): 56 U/L (ref 31–125)
BUN/Creatinine Ratio: 5 (calc) — ABNORMAL LOW (ref 6–22)
BUN: 4 mg/dL — ABNORMAL LOW (ref 7–25)
CO2: 29 mmol/L (ref 20–32)
Calcium: 8.7 mg/dL (ref 8.6–10.2)
Chloride: 103 mmol/L (ref 98–110)
Creat: 0.75 mg/dL (ref 0.50–1.10)
GFR, Est African American: 112 mL/min/{1.73_m2} (ref >=60)
GFR, Est Non African American: 97 mL/min/{1.73_m2} (ref >=60)
Globulin: 2.4 g/dL (calc) (ref 1.9–3.7)
Glucose, Bld: 84 mg/dL (ref 65–99)
Potassium: 3.2 mmol/L — ABNORMAL LOW (ref 3.5–5.3)
Sodium: 140 mmol/L (ref 135–146)
Total Bilirubin: 0.4 mg/dL (ref 0.2–1.2)
Total Protein: 6.1 g/dL (ref 6.1–8.1)

## 2018-06-13 LAB — CBC WITH DIFFERENTIAL/PLATELET
Absolute Monocytes: 459 cells/uL (ref 200–950)
Basophils Absolute: 59 cells/uL (ref 0–200)
Basophils Relative: 1.1 %
EOS PCT: 4.3 %
Eosinophils Absolute: 232 cells/uL (ref 15–500)
HCT: 34.6 % — ABNORMAL LOW (ref 35.0–45.0)
Hemoglobin: 11.9 g/dL (ref 11.7–15.5)
Lymphs Abs: 1971 cells/uL (ref 850–3900)
MCH: 28.8 pg (ref 27.0–33.0)
MCHC: 34.4 g/dL (ref 32.0–36.0)
MCV: 83.8 fL (ref 80.0–100.0)
MPV: 10.3 fL (ref 7.5–12.5)
Monocytes Relative: 8.5 %
NEUTROS ABS: 2678 {cells}/uL (ref 1500–7800)
Neutrophils Relative %: 49.6 %
Platelets: 444 10*3/uL — ABNORMAL HIGH (ref 140–400)
RBC: 4.13 10*6/uL (ref 3.80–5.10)
RDW: 14.7 % (ref 11.0–15.0)
Total Lymphocyte: 36.5 %
WBC: 5.4 10*3/uL (ref 3.8–10.8)

## 2018-06-13 LAB — EPSTEIN-BARR VIRUS VCA ANTIBODY PANEL
EBV NA IgG: 180 U/mL — ABNORMAL HIGH
EBV VCA IgG: 201 U/mL — ABNORMAL HIGH
EBV VCA IgM: <36 U/mL

## 2018-06-13 LAB — QUANTIFERON-TB GOLD PLUS
Mitogen-NIL: >10 IU/mL
NIL: 0.02 IU/mL
QuantiFERON-TB Gold Plus: NEGATIVE
TB1-NIL: 0.01 IU/mL
TB2-NIL: 0.01 IU/mL

## 2018-06-13 LAB — EPSTEIN-BARR VIRUS EARLY D ANTIGEN ANTIBODY, IGG: EBV EA IgG: <9 U/mL

## 2018-06-13 LAB — CMV IGM

## 2018-06-13 LAB — C-REACTIVE PROTEIN: CRP: 15.7 mg/L — ABNORMAL HIGH (ref <8.0)

## 2018-06-13 LAB — D-DIMER, QUANTITATIVE: D-Dimer, Quant: 0.75 mcg/mL FEU — ABNORMAL HIGH (ref <0.50)

## 2018-06-13 LAB — CYTOMEGALOVIRUS ANTIBODY, IGG

## 2018-06-13 LAB — SEDIMENTATION RATE: Sed Rate: 14 mm/h (ref 0–20)

## 2018-06-16 ENCOUNTER — Telehealth: Payer: Self-pay

## 2018-06-16 ENCOUNTER — Ambulatory Visit
Admission: RE | Admit: 2018-06-16 | Discharge: 2018-06-16 | Disposition: A | Discharge status: Home / Self Care | Payer: Managed Care, Other (non HMO) | Source: Ambulatory Visit | Attending: Infectious Disease | Admitting: Infectious Disease

## 2018-06-16 DIAGNOSIS — R634 Abnormal weight loss: Secondary | ICD-10-CM

## 2018-06-16 DIAGNOSIS — F32A Depression, unspecified: Secondary | ICD-10-CM

## 2018-06-16 DIAGNOSIS — M542 Cervicalgia: Secondary | ICD-10-CM

## 2018-06-16 DIAGNOSIS — M797 Fibromyalgia: Secondary | ICD-10-CM

## 2018-06-16 DIAGNOSIS — R0789 Other chest pain: Secondary | ICD-10-CM

## 2018-06-16 DIAGNOSIS — R61 Generalized hyperhidrosis: Secondary | ICD-10-CM

## 2018-06-16 DIAGNOSIS — R5383 Other fatigue: Secondary | ICD-10-CM

## 2018-06-16 DIAGNOSIS — F329 Major depressive disorder, single episode, unspecified: Secondary | ICD-10-CM

## 2018-06-16 DIAGNOSIS — R112 Nausea with vomiting, unspecified: Secondary | ICD-10-CM

## 2018-06-16 DIAGNOSIS — Z86718 Personal history of other venous thrombosis and embolism: Secondary | ICD-10-CM

## 2018-06-16 MED ORDER — GADOBENATE DIMEGLUMINE 529 MG/ML IV SOLN
14.0000 mL | Freq: Once | INTRAVENOUS | Status: AC | PRN
  Administered 2018-06-16: 14 mL via INTRAVENOUS

## 2018-06-16 NOTE — Telephone Encounter (Signed)
I reviewed her CT and do not see an indication for a MRI.  She can discuss with her pulm onc MD at f/u if she still feels this is warranted.  thanks

## 2018-06-16 NOTE — Telephone Encounter (Signed)
Patient called office today requesting to speak with Dr. Tommy Medal regarding D- Dimer quantitative results as well as Ct of chest. Patient states that she is still have chest pain, shortness of breath, and mid-lower back pain. Patient states she would like to start treatment for her symptoms. Will relay message to Dr. Tommy Medal. Aundria Rud, CMA

## 2018-06-16 NOTE — Telephone Encounter (Signed)
Patient called back and informed her that Dr. Johnnye Sima did not see an indication for MRI based of the CT results.  Informed her to discuss with her pulmonary Oncologist f/u if she feels warranted.  Pricilla Riffle RN

## 2018-06-16 NOTE — Telephone Encounter (Signed)
Patient called requesting to have a MRI of her lungs/chest.  Patient has a MRI of the cervical spine scheduled today 06/16/2018 at 5:00om and wants the MRI of the Chest to be added on. Patient states she has an appointment with a pulmonary oncologist 3/24  Patient states, "I know something is wrong with me and I need them to take a better look."  Explained to patient that a message was already sent to her provider to decipher CT results and D-Dimer results.  Patient states she went to [facility] ED  06/11/2018 and states it was the worst experience she does not feel she got the proper testing. Pricilla Riffle RN

## 2018-06-22 ENCOUNTER — Emergency Department (HOSPITAL_COMMUNITY)
Admission: EM | Admit: 2018-06-22 | Discharge: 2018-06-22 | Disposition: A | Discharge status: Home / Self Care | Payer: Managed Care, Other (non HMO) | Attending: Emergency Medicine | Admitting: Emergency Medicine

## 2018-06-22 ENCOUNTER — Emergency Department (HOSPITAL_COMMUNITY): Payer: Managed Care, Other (non HMO)

## 2018-06-22 ENCOUNTER — Encounter (HOSPITAL_COMMUNITY): Payer: Self-pay

## 2018-06-22 ENCOUNTER — Other Ambulatory Visit: Payer: Self-pay

## 2018-06-22 DIAGNOSIS — I10 Essential (primary) hypertension: Secondary | ICD-10-CM | POA: Insufficient documentation

## 2018-06-22 DIAGNOSIS — Z91040 Latex allergy status: Secondary | ICD-10-CM | POA: Diagnosis not present

## 2018-06-22 DIAGNOSIS — Z79899 Other long term (current) drug therapy: Secondary | ICD-10-CM | POA: Diagnosis not present

## 2018-06-22 DIAGNOSIS — R079 Chest pain, unspecified: Secondary | ICD-10-CM | POA: Diagnosis present

## 2018-06-22 HISTORY — DX: Malignant (primary) neoplasm, unspecified: C80.1

## 2018-06-22 LAB — CBC WITH DIFFERENTIAL/PLATELET
Abs Immature Granulocytes: 0.01 10*3/uL (ref 0.00–0.07)
Basophils Absolute: 0.1 10*3/uL (ref 0.0–0.1)
Basophils Relative: 1 %
Eosinophils Absolute: 0.3 10*3/uL (ref 0.0–0.5)
Eosinophils Relative: 5 %
HCT: 32.9 % — ABNORMAL LOW (ref 36.0–46.0)
Hemoglobin: 10.9 g/dL — ABNORMAL LOW (ref 12.0–15.0)
Immature Granulocytes: 0 %
Lymphocytes Relative: 60 %
Lymphs Abs: 3.2 10*3/uL (ref 0.7–4.0)
MCH: 28.2 pg (ref 26.0–34.0)
MCHC: 33.1 g/dL (ref 30.0–36.0)
MCV: 85 fL (ref 80.0–100.0)
MONO ABS: 0.5 10*3/uL (ref 0.1–1.0)
Monocytes Relative: 10 %
Neutro Abs: 1.3 10*3/uL — ABNORMAL LOW (ref 1.7–7.7)
Neutrophils Relative %: 24 %
Platelets: 358 10*3/uL (ref 150–400)
RBC: 3.87 MIL/uL (ref 3.87–5.11)
RDW: 14.9 % (ref 11.5–15.5)
WBC: 5.4 10*3/uL (ref 4.0–10.5)
nRBC: 0 % (ref 0.0–0.2)

## 2018-06-22 LAB — I-STAT TROPONIN, ED
Troponin i, poc: 0 ng/mL (ref 0.00–0.08)
Troponin i, poc: 0 ng/mL (ref 0.00–0.08)

## 2018-06-22 LAB — BASIC METABOLIC PANEL
Anion gap: 10 (ref 5–15)
BUN: 10 mg/dL (ref 6–20)
CO2: 23 mmol/L (ref 22–32)
Calcium: 8.8 mg/dL — ABNORMAL LOW (ref 8.9–10.3)
Chloride: 104 mmol/L (ref 98–111)
Creatinine, Ser: 0.72 mg/dL (ref 0.44–1.00)
GFR calc Af Amer: >60 mL/min (ref >60)
GFR calc non Af Amer: >60 mL/min (ref >60)
Glucose, Bld: 86 mg/dL (ref 70–99)
Potassium: 3.3 mmol/L — ABNORMAL LOW (ref 3.5–5.1)
Sodium: 137 mmol/L (ref 135–145)

## 2018-06-22 MED ORDER — LIDOCAINE 5 % EX PTCH: 1.0000 | MEDICATED_PATCH | CUTANEOUS | Status: DC

## 2018-06-22 MED ORDER — LIDOCAINE 5 % EX PTCH
1.0000 | MEDICATED_PATCH | Freq: Once | CUTANEOUS | Status: DC
  Filled 2018-06-22: qty 1

## 2018-06-22 NOTE — ED Notes (Signed)
Requested medical records from [facility]

## 2018-06-22 NOTE — ED Notes (Signed)
This RN explained to the patient about the new process of moving patients to progressive beds and about her eligibility to be moved due to her stable vitals. Patient refused to move to the hallway, stating " Last time I was here, I was moved to the hallway and it was completely inappropriate. I was moved to the hallway and it wasn't a good experience." Charge nurse notified.

## 2018-06-22 NOTE — ED Provider Notes (Signed)
Medical screening examination/treatment/procedure(s) were conducted as a shared visit with non-physician practitioner(s) and myself.  I personally evaluated the patient during the encounter.  EKG Interpretation  Date/Time:  Sunday June 22 2018 00:45:26 EDT Ventricular Rate:  88 PR Interval:    QRS Duration: 82 QT Interval:  373 QTC Calculation: 452 R Axis:   86 Text Interpretation:  Sinus rhythm Borderline T abnormalities, anterior leads TWI in anterior leads new from most recent but present on some past ones in juanuary/'march No acute changes Confirmed by Merrily Pew [phone]) on 06/22/2018 2:46:30 AM     [name], [name], MD 06/22/18 [phone]

## 2018-06-22 NOTE — ED Provider Notes (Signed)
[place] EMERGENCY DEPARTMENT Provider Note   CSN: [id number] Arrival date & time: 06/22/18  0039    History   Chief Complaint Chief Complaint  Patient presents with  . Chest Pain    HPI Tiffany [name] is a 43 y.o. female.  HPI: A 43 year old patient presents for evaluation of chest pain. Initial onset of pain was more than 6 hours ago. The patient's chest pain is described as heaviness/pressure/tightness, is sharp and is not worse with exertion. The patient's chest pain is middle- or left-sided, is not well-localized and does not radiate to the arms/jaw/neck. The patient does not complain of nausea and denies diaphoresis. The patient has a family history of coronary artery disease in a first-degree relative with onset less than age 12. The patient has no history of stroke, has no history of peripheral artery disease, has not smoked in the past 90 days, denies any history of treated diabetes, is not hypertensive, has no history of hypercholesterolemia and does not have an elevated BMI (>=30).   Patient presents to the emergency department with a chief complaint of chest pain.  She reports having the symptoms for the past week.  States that she was seen here recently and was diagnosed with having groundglass opacities on CT scan.  She reports being referred to pulmonology/oncology at [facility].  States that she went there earlier in the week and had a repeat CT scan which reportedly showed PE.  She states that they did not treat her at the time, and reports that she has another follow-up appointment next week.  She is complaining of persistent pain.  Denies any productive cough.  Denies any fevers or chills.  Denies any other associated symptoms.  The history is provided by the patient. No language interpreter was used.    Past Medical History:  Diagnosis Date  . Abnormal Pap smear 1997   LAST PAP 10/2010  . Allergy    SEASONAL  . Anemia    CHRONIC  . 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 Arthritis   . Blood dyscrasia    SICKLE CELL TRAIT  . Blood transfusion without reported diagnosis 2005   after vaginal delivery  . Breast mass in female 2003  . Broken foot 03/16/2016   right  . Bruises easily   . BV (bacterial vaginosis)   . Cancer ([place])   . Cyst of ovary 2003  . Depression 2003  . DUB (dysfunctional uterine bleeding) 2003  . DVT, lower extremity ([place]) 10/2014   left leg  . Fatigue 06/10/2018  . Fibroid    LONG TERM DX  . Fibromyalgia 2004  . Fibrositis   . GERD (gastroesophageal reflux disease) 2007  . H/O amenorrhea 2006  . H/O menorrhagia 2011  . H/O sickle cell trait   . Heart murmur 2007  . History of abnormal cervical Pap smear 2003   had colposcopy and cryo.  Follow up paps are normal.  . Hx: UTI (urinary tract infection) 2003  . Hypertension 2005   RESOLVED WITH WT LOSS  . IBD (inflammatory bowel disease)   . IBS (irritable bowel syndrome)   . Infection    OCC YEAST  . Low back pain 2003  . Menses, irregular 2003  . Migraine   . Nausea and vomiting 06/10/2018  . Neck pain 06/10/2018  . Night sweats 06/10/2018  . Obese 2003  . Pulmonary embolism ([place]) 10/2014  . Sacroiliitis ([place])   . Sickle-cell trait (  H[facility])   . Trichomonas   . Uterine leiomyoma   . Vaginitis and vulvovaginitis 2004  . Weight loss 06/10/2018  . Yeast vaginitis 04/24/2010    Patient Active Problem List   Diagnosis Date Noted  . Cyst of ovary 06/10/2018  . Weight loss 06/10/2018  . Neck pain 06/10/2018  . Night sweats 06/10/2018  . Fatigue 06/10/2018  . Nausea and vomiting 06/10/2018  . ASCUS with positive high risk HPV cervical 12/17/2017  . Anemia 11/27/2017  . Palpitations 11/28/2016  . Vitamin D deficiency 11/28/2016  . Abdominal bloating 12/27/2015  . Abnormal weight gain 12/23/2015  . Peripheral edema 11/22/2015  . Atypical chest pain 11/10/2015  . Gastric polyps 11/10/2015  . Hypokalemia 08/15/2015  . Seasonal allergic rhinitis due to  pollen 07/05/2015  . Sickle cell trait ([place]) 07/05/2015  . Genetic testing 06/13/2015  . Family history of breast cancer 06/02/2015  . Family history of malignant neoplasm of ovary 06/02/2015  . BV (bacterial vaginosis) 02/18/2015  . Hemorrhoids 02/18/2015  . Fibrositis 11/30/2014  . Left leg DVT ([place]) 11/18/2014  . History of DVT of lower extremity 11/18/2014  . Left leg pain 11/09/2014  . History of laparoscopic adjustable gastric banding 01/29/2012  . Depression 12/19/2011  . Fibromyalgia 12/19/2011  . Arthritis 12/19/2011  . Irritable bowel syndrome 12/19/2011  . Gastroesophageal reflux disease 12/19/2011  . Uterine leiomyoma 12/19/2011  . Current moderate episode of major depressive disorder without prior episode ([place]) 12/19/2011  . Undifferentiated inflammatory arthritis ([place]) 12/19/2011  . Abnormal cervical Papanicolaou smear 04/10/1995    Past Surgical History:  Procedure Laterality Date  . AUGMENTATION MAMMAPLASTY  3295   Silicone implants  . broken ankle Right   . CERVICAL CONE BIOPSY  1997  . COSMETIC SURGERY  2012   thigh lift  . CYSTECTOMY Right    obtain date from patient, was not on history form dated 06/16/10--benign cyst removed Rt.ovary  . DILATION AND CURETTAGE OF UTERUS    . LAPAROSCOPIC GASTRIC BANDING  05/20/2007  . WISDOM TOOTH EXTRACTION       OB History    Gravida  12   Para  6   Term  6   Preterm      AB  6   Living  6     SAB  6   TAB      Ectopic      Multiple      Live Births  6            Home Medications    Prior to Admission medications   Medication Sig Start Date End Date Taking? Authorizing Provider  acetaminophen (TYLENOL) 500 MG tablet Take 1 tablet (500 mg total) by mouth every 6 (six) hours as needed. Patient not taking: Reported on 06/10/2018 10/14/16   Frederica Kuster, PA-C  ALPRAZolam (XANAX XR) 1 MG 24 hr tablet Take 1 mg by mouth as needed for anxiety.    [provider]  ALPRAZolam Duanne Moron) 1 MG  tablet Take 1 mg by mouth at bedtime as needed for anxiety.    [provider]  amphetamine-dextroamphetamine (ADDERALL) 20 MG tablet Take 20 mg by mouth daily.  06/19/16   [provider]  betamethasone valerate ointment (VALISONE) 0.1 % Apply a pea sized amount topically BID for 1-2 weeks as needed. Not for daily long term use. Patient not taking: Reported on 04/15/2018 04/30/17   Salvadore Dom, MD  cetirizine (ZYRTEC) 10 MG tablet Take 10  mg by mouth daily.  09/07/15   [provider]  fluconazole (DIFLUCAN) 150 MG tablet Take 1 tablet (150 mg total) by mouth daily. 06/11/18   Pattricia Boss, MD  fluticasone (FLONASE) 50 MCG/ACT nasal spray Place 1 spray into both nostrils daily as needed for allergies.  10/12/15   [provider]  levofloxacin (LEVAQUIN) 500 MG tablet Take 1 tablet (500 mg total) by mouth daily. 06/11/18   Pattricia Boss, MD  lubiprostone (AMITIZA) 24 MCG capsule Take 1 capsule (24 mcg total) by mouth daily with breakfast. Patient not taking: Reported on 04/15/2018 12/09/14   Boykin Nearing, MD  magnesium oxide (MAG-OX) 400 MG tablet Take 1 tablet (400 mg total) by mouth daily. Patient not taking: Reported on 06/10/2018 04/15/18   Isla Pence, MD  MYDAYIS 50 MG CP24 Take 50 mg by mouth daily.  04/23/16   [provider]  nystatin cream (MYCOSTATIN) Apply 1 application topically 2 (two) times daily. Apply to affected area BID for up to 7 days. Patient not taking: Reported on 04/15/2018 09/05/16   [name], CNM  ondansetron (ZOFRAN) 4 MG tablet Take 1 tablet (4 mg total) by mouth every 8 (eight) hours as needed for nausea or vomiting. 06/15/17   Zigmund Gottron, NP  Potassium Chloride ER 20 MEQ TBCR Take 40 mEq by mouth daily. 06/11/18   Pattricia Boss, MD  promethazine (PHENERGAN) 25 MG tablet Take 1 tablet (25 mg total) by mouth every 6 (six) hours as needed for nausea or vomiting. 04/15/18   Isla Pence, MD  vortioxetine HBr  (TRINTELLIX) 10 MG TABS tablet Take 10 mg by mouth daily.    [provider]  zolpidem (AMBIEN) 10 MG tablet Take 10 mg by mouth at bedtime.    [provider]    Family History Family History  Problem Relation Age of Onset  . Diabetes Mother   . Arthritis Mother   . Hyperlipidemia Mother   . Other Mother        VARICOSE VEINS; DECREASED HEART RATE; +Hysterectomy for unspecified reason  . Fibromyalgia Mother   . Colon polyps Mother        unspecified number  . Depression Mother   . Heart disease Father   . Hypertension Father   . Alcohol abuse Father   . Drug abuse Father   . Stroke Father   . Ovarian cancer Sister        dx. <43y  . Cervical cancer Sister 35  . Multiple sclerosis Brother   . Thyroid disease Brother        hypothyroidism  . Lung cancer Maternal Grandmother 74       secondhand smoke  . Depression Maternal Grandmother   . Miscarriages / Stillbirths Maternal Grandmother   . Birth defects Maternal Aunt        EXTRA DIGITS; MUSCULAR DISEASE  . Early death Maternal Aunt 69  . COPD Maternal Grandfather   . Stroke Maternal Grandfather   . Ovarian cancer Maternal Aunt 14       Dec.age 24 Ovarian CA  . Early death Maternal Aunt        d. three days after birth; unknown cause  . Breast cancer Maternal Aunt 14  . Colon polyps Maternal Aunt        unspecified number  . Liver disease Paternal Aunt   . Ovarian cancer Maternal Aunt 20  . Depression Maternal Aunt   . Heart attack Paternal Uncle  in mid-50s  . Alcohol abuse Maternal Uncle   . Breast cancer Cousin        maternal    Social History Social History   Tobacco Use  . Smoking status: Never Smoker  . Smokeless tobacco: Never Used  Substance Use Topics  . Alcohol use: Yes    Alcohol/week: 1.0 standard drinks    Types: 1 Glasses of wine per week  . Drug use: No     Allergies   Flexeril [cyclobenzaprine hcl]; Latex; and Other   Review of Systems Review of Systems   All other systems reviewed and are negative.    Physical Exam Updated Vital Signs BP 127/88   Pulse 74   Temp 98.3 F (36.8 C) (Oral)   Resp 17   Ht 5\' 6"  (1.676 m)   Wt 68.5 kg   LMP 05/23/2018 (Approximate)   SpO2 100%   BMI 24.37 kg/m   Physical Exam Vitals signs and nursing note reviewed.  Constitutional:      Appearance: She is well-developed.  HENT:     Head: Normocephalic and atraumatic.  Eyes:     Conjunctiva/sclera: Conjunctivae normal.     Pupils: Pupils are equal, round, and reactive to light.  Neck:     Musculoskeletal: Normal range of motion and neck supple.  Cardiovascular:     Rate and Rhythm: Normal rate and regular rhythm.     Heart sounds: No murmur. No friction rub. No gallop.   Pulmonary:     Effort: Pulmonary effort is normal. No respiratory distress.     Breath sounds: Normal breath sounds. No wheezing or rales.  Chest:     Chest wall: No tenderness.  Abdominal:     General: Bowel sounds are normal. There is no distension.     Palpations: Abdomen is soft. There is no mass.     Tenderness: There is no abdominal tenderness. There is no guarding or rebound.  Musculoskeletal: Normal range of motion.        General: No tenderness.  Skin:    General: Skin is warm and dry.  Neurological:     Mental Status: She is alert and oriented to person, place, and time.  Psychiatric:        Behavior: Behavior normal.        Thought Content: Thought content normal.        Judgment: Judgment normal.      ED Treatments / Results  Labs (all labs ordered are listed, but only abnormal results are displayed) Labs Reviewed  CBC WITH DIFFERENTIAL/PLATELET - Abnormal; Notable for the following components:      Result Value   Hemoglobin 10.9 (*)    HCT 32.9 (*)    Neutro Abs 1.3 (*)    All other components within normal limits  BASIC METABOLIC PANEL - Abnormal; Notable for the following components:   Potassium 3.3 (*)    Calcium 8.8 (*)    All other  components within normal limits  I-STAT TROPONIN, ED    EKG EKG Interpretation  Date/Time:  Sunday June 22 2018 04:52:23 EDT Ventricular Rate:  77 PR Interval:    QRS Duration: 87 QT Interval:  403 QTC Calculation: 457 R Axis:   78 Text Interpretation:  Sinus rhythm Low voltage, precordial leads Borderline T abnormalities, anterior leads TWI similar to previous Confirmed by Ezequiel Essex [phone]) on 06/22/2018 4:58:06 AM   Radiology Dg Chest 2 View  Result Date: 06/22/2018 CLINICAL DATA:  Chest pain EXAM:  CHEST - 2 VIEW COMPARISON:  06/11/2018, 04/15/2018 FINDINGS: The heart size and mediastinal contours are within normal limits. Both lungs are clear. The visualized skeletal structures are unremarkable. IMPRESSION: No active cardiopulmonary disease. Electronically Signed   By: Donavan Foil M.D.   On: 06/22/2018 01:48    Procedures Procedures (including critical care time)  Medications Ordered in ED Medications - No data to display   Initial Impression / Assessment and Plan / ED Course  I have reviewed the triage vital signs and the nursing notes.  Pertinent labs & imaging results that were available during my care of the patient were reviewed by me and considered in my medical decision making (see chart for details).     HEAR Score: 2  Patient with central and left-sided chest pain.  This is not a new problem.  She has been seen recently for the same both here and reportedly at [facility].  States that she had groundglass opacities seen on CT scan here, but had blood clots seen on CT scan at [facility].  I am unable to view [facility]'s recent visit in care everywhere.  I have asked the secretary to have them fax over the records.   Patient did not have a CT scan at [facility], I have reviewed the records, in which [facility] did not feel that repeat scan was indicated.  There was no blood clot seen on prior CT scan here.  She has follow-up with cardiology and pulmonology next week.  Patient seen by  and discussed with Dr. Dayna Barker.  Do not feel that patient requires any additional emergent workup tonight.  Final Clinical Impressions(s) / ED Diagnoses   Final diagnoses:  Chest pain, unspecified type    ED Discharge Orders    None       Montine Circle, PA-C 06/22/18 [id number]    Mesner, [name], MD 06/22/18 [phone]

## 2018-06-22 NOTE — ED Triage Notes (Signed)
Pt brought in by [facility] from home for CP, dx with lung cancer and a PE recently. Has not followed up with pulmonology. CP left sided, radiation to back, sudden onset, similar episode earlier today. x1 nitro, 4mg  zofran, and 324 asa given PTA. Pt also endorses intermittent nausea. Pt skin warm and dry, A+Ox4.

## 2018-07-02 ENCOUNTER — Encounter: Payer: Self-pay | Admitting: *Deleted

## 2018-07-02 ENCOUNTER — Telehealth: Payer: Self-pay | Admitting: *Deleted

## 2018-07-02 NOTE — Telephone Encounter (Signed)
Called pt & LVM (ok per DPR) advising pt that d/t covid 19 pandemic, our office is severely reducing in person visits for at least the next two week, in order to minimize the risk to our patients and healthcare providers. Advised that we recommend patient convert to a telemedicine visit. There is an app she can download for this visit. Advised we can do this tomorrow. Asked for call back to discuss. Alternatively if she does not wish to do a video appt she can r/s for a later date. Left office number in message.

## 2018-07-02 NOTE — Addendum Note (Signed)
Addended by: Gildardo Griffes on: 07/02/2018 02:24 PM   Modules accepted: Orders

## 2018-07-02 NOTE — Telephone Encounter (Signed)
Spoke with patient and discussed appt. She consented to do a video webex visit and for [place] to file the visit with her insurance. She has both laptop and smart phone capability and is aware that camera is necessary for appt. I gave her the website & app to download webex. I updated her med/social/fam/sx history, allergies & medications today during the call. She provided her email address (see below) and I scheduled her video appt for 8:00 AM on 07/03/2018. Pt advised that if she doesn't receive an email this afternoon to call office so we can resolve issues before appt tomorrow. She verbalized understanding & appreciation.  [email or web address]  Virtual webex visit created & pt emailed with instructions to join meeting tomorrow morning.

## 2018-07-03 ENCOUNTER — Other Ambulatory Visit: Payer: Self-pay

## 2018-07-03 ENCOUNTER — Encounter: Payer: Self-pay | Admitting: Neurology

## 2018-07-03 ENCOUNTER — Ambulatory Visit (INDEPENDENT_AMBULATORY_CARE_PROVIDER_SITE_OTHER): Payer: Managed Care, Other (non HMO) | Admitting: Neurology

## 2018-07-03 VITALS — Ht 66.0 in | Wt 172.0 lb

## 2018-07-03 DIAGNOSIS — F32A Depression, unspecified: Secondary | ICD-10-CM

## 2018-07-03 DIAGNOSIS — F988 Other specified behavioral and emotional disorders with onset usually occurring in childhood and adolescence: Secondary | ICD-10-CM | POA: Diagnosis not present

## 2018-07-03 DIAGNOSIS — R413 Other amnesia: Secondary | ICD-10-CM | POA: Diagnosis not present

## 2018-07-03 DIAGNOSIS — M797 Fibromyalgia: Secondary | ICD-10-CM

## 2018-07-03 DIAGNOSIS — Z820 Family history of epilepsy and other diseases of the nervous system: Secondary | ICD-10-CM

## 2018-07-03 DIAGNOSIS — R202 Paresthesia of skin: Secondary | ICD-10-CM

## 2018-07-03 DIAGNOSIS — R531 Weakness: Secondary | ICD-10-CM | POA: Diagnosis not present

## 2018-07-03 DIAGNOSIS — F329 Major depressive disorder, single episode, unspecified: Secondary | ICD-10-CM

## 2018-07-03 DIAGNOSIS — R4189 Other symptoms and signs involving cognitive functions and awareness: Secondary | ICD-10-CM

## 2018-07-03 DIAGNOSIS — H539 Unspecified visual disturbance: Secondary | ICD-10-CM

## 2018-07-03 NOTE — Progress Notes (Signed)
[facility]    Provider:  Dr Jaynee Eagles Requesting Provider: Bernerd Limbo, MD Primary Care Provider:  Bernerd Limbo, MD  CC:  Memory loss  Virtual Visit via Video Note  I connected with@ on 07/12/2018 at  8:00 AM EDT by a video enabled telemedicine application and verified that I am speaking with the correct person using two identifiers.   I discussed the limitations of evaluation and management by telemedicine and the availability of in person appointments. The patient expressed understanding and agreed to proceed. Patient is at home. Physician is in the office.   Melvenia Beam, MD  HPI:  Tiffany [name] is a 43 y.o. female here as requested by Bernerd Limbo, MD for memory loss. PMHx sickle cell trait, DVT of lower extremity, undifferentiated inflammatory arthritis, depressive disorder, IBS, fibromyalgia. She has been having brain fog, short term memory loss, can;t remember things from 5 minutes ago, she is listening and she is hearing but can't recall anything. Affecting her work life. She has a hard time holding her job down because of it. It started at least 2-3 years ago. They thought it was ADD so she started on Adderall which helps with concentration but at 5pm when she shuts down she has to review everything again to try and recall what they talked about during the meetings. She gets backed up on the work she is supposed to have done. She feels it is worsening. Mother has dementia and grandmother with Alzheimers in her 60s. Her mother is 73 but patient does not know what kind of dementia she has. Mother has atrophy in the brain. Mother with early onset dementia around 78, mother had diabetes but is under control, no hx of alcohol use in mother or any other reason for dementia that she knows of. Patient is a Glass blower/designer. Patient denies any significant alcohol use, smoking - in her 30s she had a few years of drinking alcohol 3x a week but now she has a drink 2x a  month. She has raised 8 children most are older(high school and college) but she has one young one. Her coworkers have noticed. Her associate director gets on her about forgetting. She has depression and is a single mother but the kids are mostly older. She has a 63 year old but also has support. She has gone through several deaths in 2009-07-11 her father andgrandfather , 33 her cousin, her sister died and her brother died of 07-12-2015. Sister died of cervical cancer, brother with MS. She takes [place] every night. She takes adderall during the day. If she doesn't take the [place] her mind is racing all night and she can't sleep. She is not missing bills at home. She appears very detailed with her accounts and bills. She had one episode of getting lost, not knowing where she was - maybe 3-4 episodes- she also walks into the grocery store and forgets what she is there for. Friends and family are noticing. She is also forgetting things that have happened in her childhood. She was diagnosed by a psychiatrist with ADD in her teens, she made it through college without medication she graduated with honors. She started in clinical research in 07-12-1998 and in 07-12-06 she felt things "escapng her" and was started on adderall. It helps her concentrate but not with memory. If she reviews a patient chart she can forget and try to recall things she read, she has to take a lot of notes. She has weakness of  the left arm and leg with tingling and going numb, she has headaches, sometimes blurry vision but eye exams are good. Brother with MS. No alteration of awareness but her son was just diagnosed with Absence Seizures. Brother also had seizures. She has Fibromyalgia and has lots of neck and back pain, she went to rheumatology and had lots of lab work. She worries about her health. Rheumatology did not show anything per patient.   Reviewed notes, labs and imaging from outside physicians, which showed:   Reviewed Dr. Chales Abrahams notes.  At last  visit multiple labs were taken including Lyme, TSH, CBC, vitamin D, CMP.  I do not have those results but I will request them.    CT head 10/14/2016 showed No acute intracranial abnormalities including mass lesion or mass effect, hydrocephalus, extra-axial fluid collection, midline shift, hemorrhage, or acute infarction, large ischemic events (personally reviewed images)     Patient was seen by rheumatology in September.  Nothing was identified and evaluation.  Patient is frustrated.  She has chronic fatigue, pain of muscles, visual impairment and short-term memory loss.  She has been seen by multiple providers including rheumatology and no cause for her symptoms have been found.  Generally feels like providers are blowing her off not taking her symptoms seriously.  She believes there is something wrong.  She is tired of not feeling good at interfering with her job.  She is changed jobs 3 times in the past 3 years.  Forgetfulness, heavy fatigue, heavy lethargy all the time, confusion.  Feels like her vision is blurry but when she sees her eye specialist her prescription is unchanged.  Lots of muscle pain especially in the neck.  She also cares for her 8 children.  Her psychiatrist recommended evaluation for Lyme disease.  She also has unexplained weight loss, following up with GI, previous weight loss with associated with gastric polyps.  Sickle cell trait unlikely the cause of her myalgias, neck pain, visual changes and short-term memory problems.  Memory loss unclear etiology possibly due to chronic stressors and pain.  Review of Systems: Patient complains of symptoms per HPI as well as the following symptoms: joint pain, muscle pain, vision changes, paresthesias, memory loss, insomnia. Pertinent negatives and positives per HPI. All others negative.   Social History   Socioeconomic History   Marital status: Single    Spouse name: Not on file   Number of children: 8   Years of education: 16     Highest education level: Not on file  Occupational History   Occupation: CLINICAL RESEARCH  Social Needs   Financial resource strain: Not on file   Food insecurity:    Worry: Not on file    Inability: Not on file   Transportation needs:    Medical: Not on file    Non-medical: Not on file  Tobacco Use   Smoking status: Never Smoker   Smokeless tobacco: Never Used  Substance and Sexual Activity   Alcohol use: Yes    Alcohol/week: 1.0 standard drinks    Types: 1 Glasses of wine per week    Comment: maybe once or twice a month   Drug use: No   Sexual activity: Yes    Partners: Male    Birth control/protection: Condom  Lifestyle   Physical activity:    Days per week: Not on file    Minutes per session: Not on file   Stress: Not on file  Relationships   Social connections:  Talks on phone: Not on file    Gets together: Not on file    Attends religious service: Not on file    Active member of club or organization: Not on file    Attends meetings of clubs or organizations: Not on file    Relationship status: Not on file   Intimate partner violence:    Fear of current or ex partner: Not on file    Emotionally abused: Not on file    Physically abused: Not on file    Forced sexual activity: Not on file  Other Topics Concern   Not on file  Social History Narrative   Lives at home with 4 of her 8 children   Right handed    Family History  Problem Relation Age of Onset   Diabetes Mother    Arthritis Mother    Hyperlipidemia Mother    Other Mother        VARICOSE VEINS; [place]; +Hysterectomy for unspecified reason   Fibromyalgia Mother    Colon polyps Mother        unspecified number   Depression Mother    Dementia Mother    Heart disease Father    Hypertension Father    Alcohol abuse Father    Drug abuse Father    Stroke Father    Ovarian cancer Sister        dx. <43y   Cervical cancer Sister 52   Multiple  sclerosis Brother    Thyroid disease Brother        hypothyroidism   Lung cancer Maternal Grandmother 74       secondhand smoke   Depression Maternal Grandmother    Miscarriages / Stillbirths Maternal Grandmother    Other Maternal Grandmother        binswanger's disease   Birth defects Maternal Aunt        EXTRA DIGITS; MUSCULAR DISEASE   Early death Maternal Aunt 11   COPD Maternal Grandfather    Stroke Maternal Grandfather    Ovarian cancer Maternal Aunt 45       Dec.age 55 Ovarian CA   Early death Maternal Aunt        d. three days after birth; unknown cause   Breast cancer Maternal Aunt 45   Colon polyps Maternal Aunt        unspecified number   Liver disease Paternal Aunt    Ovarian cancer Maternal Aunt 45   Depression Maternal Aunt    Heart attack Paternal Uncle        in mid-50s   Alcohol abuse Maternal Uncle    Breast cancer Cousin        maternal    Past Medical History:  Diagnosis Date   Abnormal Pap smear 1997   LAST PAP 10/2010   Allergy    SEASONAL   Anemia    CHRONIC   Anxiety    TAKING MEDS; DR [facility]   Arthritis    Blood dyscrasia    SICKLE CELL TRAIT   Blood transfusion without reported diagnosis 2005   after vaginal delivery   Breast mass in female 2003   Broken foot 03/16/2016   right   Bruises easily    BV (bacterial vaginosis)    Cancer ([place])    Cyst of ovary 2003   Depression 2003   DUB (dysfunctional uterine bleeding) 2003   DVT, lower extremity ([place]) 10/2014   left leg   Fatigue 06/10/2018   Fibroid  LONG TERM DX   Fibromyalgia 2004   Fibrositis    GERD (gastroesophageal reflux disease) 2007   H/O amenorrhea 2006   H/O menorrhagia 2011   H/O sickle cell trait    Heart murmur 2007   History of abnormal cervical Pap smear 2003   had colposcopy and cryo.  Follow up paps are normal.   Hx: UTI (urinary tract infection) 2003   Hypertension 2005   RESOLVED WITH WT LOSS   IBD  (inflammatory bowel disease)    IBS (irritable bowel syndrome)    Infection    OCC YEAST   Low back pain 2003   Menses, irregular 2003   Migraine    Nausea and vomiting 06/10/2018   Neck pain 06/10/2018   Night sweats 06/10/2018   Obese 2003   Pulmonary embolism ([place]) 10/2014   Sacroiliitis ([place])    Sickle-cell trait ([place])    Trichomonas    Uterine leiomyoma    Vaginitis and vulvovaginitis 2004   Weight loss 06/10/2018   Yeast vaginitis 04/24/2010    Patient Active Problem List   Diagnosis Date Noted   Cognitive changes 07/03/2018   Attention deficit disorder 07/03/2018   Cyst of ovary 06/10/2018   Weight loss 06/10/2018   Neck pain 06/10/2018   Night sweats 06/10/2018   Fatigue 06/10/2018   Nausea and vomiting 06/10/2018   ASCUS with positive high risk HPV cervical 12/17/2017   Anemia 11/27/2017   Palpitations 11/28/2016   Vitamin D deficiency 11/28/2016   Abdominal bloating 12/27/2015   Abnormal weight gain 12/23/2015   Peripheral edema 11/22/2015   Atypical chest pain 11/10/2015   Gastric polyps 11/10/2015   Hypokalemia 08/15/2015   Seasonal allergic rhinitis due to pollen 07/05/2015   Sickle cell trait ([place]) 07/05/2015   Genetic testing 06/13/2015   Family history of breast cancer 06/02/2015   Family history of malignant neoplasm of ovary 06/02/2015   BV (bacterial vaginosis) 02/18/2015   Hemorrhoids 02/18/2015   Fibrositis 11/30/2014   Left leg DVT ([place]) 11/18/2014   History of DVT of lower extremity 11/18/2014   Left leg pain 11/09/2014   History of laparoscopic adjustable gastric banding 01/29/2012   Depression 12/19/2011   Fibromyalgia 12/19/2011   Arthritis 12/19/2011   Irritable bowel syndrome 12/19/2011   Gastroesophageal reflux disease 12/19/2011   Uterine leiomyoma 12/19/2011   Current moderate episode of major depressive disorder without prior episode ([place]) 12/19/2011   Undifferentiated inflammatory  arthritis ([place]) 12/19/2011   Abnormal cervical Papanicolaou smear 04/10/1995    Past Surgical History:  Procedure Laterality Date   AUGMENTATION MAMMAPLASTY  3235   Silicone implants   broken ankle Right    CERVICAL CONE BIOPSY  1997   COSMETIC SURGERY  2012   thigh lift   CYSTECTOMY Right    obtain date from patient, was not on history form dated 06/16/10--benign cyst removed Rt.ovary   DILATION AND CURETTAGE OF UTERUS     LAPAROSCOPIC GASTRIC BANDING  05/20/2007   WISDOM TOOTH EXTRACTION      Current Outpatient Medications  Medication Sig Dispense Refill   acetaminophen (TYLENOL) 500 MG tablet Take 1 tablet (500 mg total) by mouth every 6 (six) hours as needed. (Patient not taking: Reported on 06/10/2018) 30 tablet 0   ALPRAZolam (XANAX XR) 1 MG 24 hr tablet Take 1 mg by mouth as needed for anxiety.     ALPRAZolam (XANAX) 1 MG tablet Take 1 mg by mouth at bedtime as needed for anxiety.  amphetamine-dextroamphetamine (ADDERALL) 20 MG tablet Take 20 mg by mouth daily as needed.      betamethasone valerate ointment (VALISONE) 0.1 % Apply a pea sized amount topically BID for 1-2 weeks as needed. Not for daily long term use. (Patient not taking: Reported on 04/15/2018) 15 g 0   cetirizine (ZYRTEC) 10 MG tablet Take 10 mg by mouth daily.      fluticasone (FLONASE) 50 MCG/ACT nasal spray Place 1 spray into both nostrils daily as needed for allergies.      levofloxacin (LEVAQUIN) 500 MG tablet Take 1 tablet (500 mg total) by mouth daily. (Patient not taking: Reported on 07/02/2018) 7 tablet 0   lubiprostone (AMITIZA) 24 MCG capsule Take 1 capsule (24 mcg total) by mouth daily with breakfast. (Patient not taking: Reported on 04/15/2018) 30 capsule 5   magnesium oxide (MAG-OX) 400 MG tablet Take 1 tablet (400 mg total) by mouth daily. (Patient not taking: Reported on 06/10/2018) 14 tablet 0   MYDAYIS 50 MG CP24 Take 50 mg by mouth daily as needed.   0   nystatin cream (MYCOSTATIN)  Apply 1 application topically 2 (two) times daily. Apply to affected area BID for up to 7 days. 30 g 0   ondansetron (ZOFRAN) 4 MG tablet Take 1 tablet (4 mg total) by mouth every 8 (eight) hours as needed for nausea or vomiting. 10 tablet 0   Potassium Chloride ER 20 MEQ TBCR Take 40 mEq by mouth daily. 14 tablet 0   promethazine (PHENERGAN) 25 MG tablet Take 1 tablet (25 mg total) by mouth every 6 (six) hours as needed for nausea or vomiting. 10 tablet 0   SPRAVATO, 56 MG DOSE, 28 MG/DEVICE SOPK Place 2 sprays into the nose every 14 (fourteen) days.     vortioxetine HBr (TRINTELLIX) 10 MG TABS tablet Take 10 mg by mouth daily.     zolpidem (AMBIEN) 10 MG tablet Take 10 mg by mouth at bedtime.     No current facility-administered medications for this visit.     Allergies as of 07/03/2018 - Review Complete 07/02/2018  Allergen Reaction Noted   Flexeril [cyclobenzaprine hcl] Shortness Of Breath, Itching, Swelling, and Rash 04/05/2011   Latex Shortness Of Breath, Itching, and Rash 04/05/2011   Other Other (See Comments) 09/06/2015    Vitals: Ht 5\' 6"  (1.676 m)    Wt 172 lb (78 kg)    BMI 27.76 kg/m    Height is 5 8, weight 157, BMI 25, pulse 89, blood pressure 124/84. Last Weight:  Wt Readings from Last 1 Encounters:  07/03/18 172 lb (78 kg)   Last Height:   Ht Readings from Last 1 Encounters:  07/03/18 5\' 6"  (1.676 m)     Physical exam: Exam: Gen: NAD, conversant      CV: attempted, Could not perform over Web Video  Eyes: Conjunctivae clear without exudates or hemorrhage  Neuro: Detailed Neurologic Exam  Speech:    Speech is normal; fluent and spontaneous with normal comprehension.  Cognition:    The patient is oriented to person, place, and time;     recent and remote memory intact;     language fluent;     normal attention, concentration,     fund of knowledge Cranial Nerves:    The pupils are equal, round, and reactive to light. Attempted, Cannot perform  fundoscopic exam. Visual fields are full to finger confrontation. Extraocular movements are intact.  The face is symmetric with normal sensation. The palate elevates  in the midline. Hearing intact. Voice is normal. Shoulder shrug is normal. The tongue has normal motion without fasciculations.   Coordination:    Normal finger to nose  Gait:    Normal native gait  Motor Observation:   no involuntary movements noted. Tone:    Appears normal  Posture:    Posture is normal. normal erect    Strength:    Strength is anti-gravity and symmetric in the upper and lower limbs.      Sensation: intact to LT     Reflex Exam:  DTR's:    Attempted, Could not perform over Web Video   Toes: Attempted Could not perform over Web Video  Clonus:   Attempted, Could not perform over Web Video     Assessment/Plan:   43 y.o. female here as requested by Bernerd Limbo, MD for memory loss. PMHx sickle cell trait, DVT of lower extremity, undifferentiated inflammatory arthritis, depressive disorder, IBS, fibromyalgia. Memory concerns seem to cluster around her work and difficulties retaining information, concentrating remembering tasks and information from meetings and emails however she does endorse also having memory loss of events that happened as a child or adolescent.   Patient is highly intelligent, graduated college with honors and is a Glass blower/designer. It could be that her memory and concentration complaints are multi- factorial including chronic pain syndrome (fibromyalgia), medications, depression, life stressors, hx of ADD. However she is very concerned and her mother was diagnosed with early-onset dementia(age 51), her brother had multiple sclerosis, she does endorse focal neurologic deficits of the left side.   - MRI of the brain to evaluate for Multiple Sclerosis, atrophy, strokes, seizure focus or any other etiology - Formal memory testing. She agrees to be referred to [facility] which may be  able to accommodate her much sooner than neuropsychiatry in this are. - continue to follow with psychiatry. May consider trying to decrease medications especially ambien, cognitive behavioral therapy and therapy for insomnia. - recommended healthy lifestyle, exercise, eating well, social life, putting self first.   Follow Up Instructions:MRI of the brain, formal memory testing, followup with me in clinic after formal memory testing completed.    I discussed the assessment and treatment plan with the patient. The patient was provided an opportunity to ask questions and all were answered. The patient agreed with the plan and demonstrated an understanding of the instructions.   The patient was advised to call back or seek an in-person evaluation if the symptoms worsen or if the condition fails to improve as anticipated.  I provided 60 minutes of non-face-to-face time during this encounter.  Orders Placed This Encounter  Procedures   MR [place]   Ambulatory referral to Neuropsychology   Cc: Bernerd Limbo, MD,    Sarina Ill, MD  [facility] [address] [place], [place] [address]  Phone [phone] Fax [phone]

## 2018-07-04 ENCOUNTER — Telehealth: Payer: Self-pay | Admitting: Neurology

## 2018-07-04 NOTE — Telephone Encounter (Signed)
Tiffany [name] it has been approved but it is going to inform choice & then once they contact the pt about the location they will then release the auth number.

## 2018-07-08 ENCOUNTER — Telehealth: Payer: Self-pay | Admitting: Neurology

## 2018-07-08 ENCOUNTER — Ambulatory Visit
Admission: RE | Admit: 2018-07-08 | Discharge: 2018-07-08 | Disposition: A | Discharge status: Home / Self Care | Payer: Managed Care, Other (non HMO) | Source: Ambulatory Visit | Attending: Neurology | Admitting: Neurology

## 2018-07-08 ENCOUNTER — Other Ambulatory Visit: Payer: Self-pay

## 2018-07-08 DIAGNOSIS — Z820 Family history of epilepsy and other diseases of the nervous system: Secondary | ICD-10-CM

## 2018-07-08 DIAGNOSIS — R531 Weakness: Secondary | ICD-10-CM

## 2018-07-08 DIAGNOSIS — H539 Unspecified visual disturbance: Secondary | ICD-10-CM

## 2018-07-08 DIAGNOSIS — R202 Paresthesia of skin: Secondary | ICD-10-CM

## 2018-07-08 DIAGNOSIS — R413 Other amnesia: Secondary | ICD-10-CM

## 2018-07-08 DIAGNOSIS — R4189 Other symptoms and signs involving cognitive functions and awareness: Secondary | ICD-10-CM

## 2018-07-08 MED ORDER — GADOBENATE DIMEGLUMINE 529 MG/ML IV SOLN
15.0000 mL | Freq: Once | INTRAVENOUS | Status: AC | PRN
  Administered 2018-07-08: 15 mL via INTRAVENOUS

## 2018-07-08 NOTE — Telephone Encounter (Signed)
Patient is scheduled with Archie Patten For telephone Interview 07/14/2018 . Telephone interview Dr. Jaynee Eagles will get a dictated note sent via Fax.  Neuropsychology Exam TBD maybe May.  Neuro psy Testing is not covered buy telephone call neither is feed back results . Archie Patten will keep [place] posted on details.  Telephone [phone]

## 2018-07-08 NOTE — Telephone Encounter (Signed)
Novella Rob: [id number] (exp. 07/04/18 to 12/31/18) order sent to GI. They will reach out to the pt to schedule.

## 2018-07-08 NOTE — Telephone Encounter (Signed)
Noted thanks °

## 2018-07-16 ENCOUNTER — Telehealth: Payer: Self-pay | Admitting: *Deleted

## 2018-07-16 NOTE — Telephone Encounter (Signed)
Received neuropsych testing report from [facility]. Report ready for Dr. Cathren Laine review. Copy sent to medical records for scanning.

## 2018-07-28 ENCOUNTER — Encounter: Payer: Self-pay | Admitting: Infectious Disease

## 2018-07-28 ENCOUNTER — Other Ambulatory Visit: Payer: Self-pay

## 2018-07-28 ENCOUNTER — Ambulatory Visit (INDEPENDENT_AMBULATORY_CARE_PROVIDER_SITE_OTHER): Payer: Managed Care, Other (non HMO) | Admitting: Infectious Disease

## 2018-07-28 DIAGNOSIS — M1990 Unspecified osteoarthritis, unspecified site: Secondary | ICD-10-CM

## 2018-07-28 DIAGNOSIS — F900 Attention-deficit hyperactivity disorder, predominantly inattentive type: Secondary | ICD-10-CM | POA: Diagnosis not present

## 2018-07-28 DIAGNOSIS — D573 Sickle-cell trait: Secondary | ICD-10-CM

## 2018-07-28 DIAGNOSIS — R002 Palpitations: Secondary | ICD-10-CM

## 2018-07-28 DIAGNOSIS — M064 Inflammatory polyarthropathy: Secondary | ICD-10-CM | POA: Diagnosis not present

## 2018-07-28 DIAGNOSIS — M542 Cervicalgia: Secondary | ICD-10-CM

## 2018-07-28 DIAGNOSIS — M797 Fibromyalgia: Secondary | ICD-10-CM | POA: Diagnosis not present

## 2018-07-28 DIAGNOSIS — M79605 Pain in left leg: Secondary | ICD-10-CM

## 2018-07-28 DIAGNOSIS — R61 Generalized hyperhidrosis: Secondary | ICD-10-CM

## 2018-07-28 DIAGNOSIS — Z20822 Contact with and (suspected) exposure to covid-19: Secondary | ICD-10-CM

## 2018-07-28 DIAGNOSIS — R6889 Other general symptoms and signs: Secondary | ICD-10-CM

## 2018-07-28 HISTORY — DX: Contact with and (suspected) exposure to covid-19: Z20.822

## 2018-07-28 NOTE — Progress Notes (Signed)
Virtual Visit via Telephone Note  I connected with Tiffany [name] on 07/28/18 at  9:30 AM EDT by telephone and verified that I am speaking with the correct person using two identifiers.   I discussed the limitations, risks, security and privacy concerns of performing an evaluation and management service by telephone and the availability of in person appointments. I also discussed with the patient that there may be a patient responsible charge related to this service. The patient expressed understanding and agreed to proceed.   History of Present Illness:   43 year old African-American woman who I saw in early March for work-up of a myriad of symptoms.  She did have a medical history significant for an unprovoked deep venous thrombosis in 2016 for which she received 6 months of anticoagulation, history of major depression on treatment via psychiatrist also with ADHD and fibromyalgia.  She had a myriad of symptoms.  She has a history of problems with diffuse joint pains but more prominent in her wrists and knees along with neck stiffness.  She has been worked up by rheumatology and had a negative rheumatological work-up.  Hence 1 of the diagnoses is fibromyalgia.  She had a CPK level done which was negative as well.  She also describes problems with blurry vision headache lethargy memory loss nausea and vomiting.  She states that over the last 10 weeks she has lost 20 pounds.  She describes having night sweats at least 5 out of 7 nights per week in which her clothes are drenched in sweat and she has to change her clothes and sheets at night.  She also states that she has profound fatigue.  She describes also numbness in her arms and legs as well as intermittent back pain.  She described pain in both of her legs but more prominently in her left leg where she previously had a deep venous thrombosis.  She also was stating that she has difficulty breathing with exertion at times and brief chest  heaviness in the center of her chest that lasts for a few minutes and resolves after she lies down in bed.   She is been worked up as mentioned by her care physician as well as rheumatologist.  We did further work-up in our clinic for her and laboratory testing for infectious diseases were unremarkable for anything being an active problem at this point in time.  I did get a d-dimer and a Doppler of her leg given her complaints of swelling.  The d-dimer came back positive and I obtained a CT angiogram.  CT angiogram did not show pulmonary embolism but did show groundglass opacities left upper lobe and lingula.  At that time on March 4 I was not considering novel coronavirus 2019 as it was in the very early days of the pandemic.  With retrospect though given the radiographic appearance this could seems to be highly likely.  I was talking to her about her travel history and found out that she had been in [place] in early December which was before the pandemic hit   But she also had been in the meeting in mid February with several representative some the [employer].  February meeting 12th, 17th, 18th.   There were several people at the meeting who have subsequently tested positive for CoVID.  Since our last visit she states that she feels not much different and that she still does have  SOB going up and dow the steps going up and down the stairs.  She has started doing yoga and pilates.  She endorses 99.8 as highest temperature since first of March.  She is working from home at present.   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   She is living at home, 84, 76 24 and 45 years old. They have been out of school since March 13th.   The 45 year old is not always sheltering in place  45 year old is with her Mom (67) and also with the patient.  With regards to optimize the health of her family members I suggested that  she actually not have her daughter with her mother because her mother being 61 is clearly the highest risk individual and her family network.  It would be better if her mother was sheltering in place without contact with her granddaughter.  Point now nearly 6 weeks since she had those findings on CT scan I think it is highly unlikely that the patient is contagious to others.  Her symptoms even at that time were very mild and frankly do not think we would have even obtained a CT scan except for an abundance of caution on my part with the anxiety I had about possibility of a pulmonary embolism.  He is also at present being worked up by neurology and has had an MRI of the brain as well as telephonic visit with a neuropsychiatrist in [place].  We have agreed to set up another E visit in the next couple of months.  She is concerned about what her CT of the lungs will look like but I have counseled her that we will not build to get any CT scans in the near future but that we could consider getting 1 4 to 6 months down the road.  She does tell me that the problems with memory are still something that are quite distressing for her.     Observations/Objective:  Ms. Slutsky seems relatively stable considering the different symptoms that she had when she presented to me on March 4.  I now suspect with retrospect that she actually had novel coronavirus 2019 infection in March when she was found to have groundglass opacities in the left lingula and left upper lobe.  She had what turns out to have been confirmed contacts that were found out afterwards, in [place] in mid February.  That being said I do not think that she is at all likely to be contagious at this point in time now almost 6 weeks from then.  She is sheltering in place though it is imperfect given that her 45 year old apparently is not always staying in the house.  I also really think that she needs to keep her 66-year-old with her at home  and not run the risk of exposing her mother who is 32 years old.  With regards to her all of her other symptoms or arthralgias joint complaints fatigue I still think the most likely unifying diagnosis for most of these is probably fibromyalgia and/or depression.  As mentioned above she is being worked up for neurological conditions and even MS was brought up in the differential based on an MRI by 1 of the neurologist though the other one felt that the findings were more consistent with normal variant.    Assessment and Plan:  Likely novel coronavirus 2019 infection in early March: At this point in time she is likely recovered.  It is difficult to tell though because she has so many chronic  symptoms including chronic symptoms of shortness of breath.  I do think the fact that is 6 weeks later would suggest that she now is recovered and noninfectious other so.  In terms of preventing further potential acquisition of an spread of the disease have asked her and her family members to shelter and placed as best they can as mentioned above  Myalgias arthralgias fatigue low-grade temperatures, chronic night sweats: This seems most likely to be due to something like fibromyalgia or chronic fatigue, complicated likely by depression.  Memory problems: Being worked up by neurology and neuropsychiatry.    Follow Up Instructions:  I have scheduled an ED visit with her again in the next few months.   I discussed the assessment and treatment plan with the patient. The patient was provided an opportunity to ask questions and all were answered. The patient agreed with the plan and demonstrated an understanding of the instructions.   The patient was advised to call back or seek an in-person evaluation if the symptoms worsen or if the condition fails to improve as anticipated.  I provided 25 minutes of non-face-to-face time during this encounter.   Alcide Evener, MD

## 2018-07-28 NOTE — Progress Notes (Signed)
No problem. I wish I had thought of it in March but it was not high on my radar and she was so benign appearing at the bedside with those contacts thoguh Im sure she had it superimposed upons her other complaints

## 2018-09-29 ENCOUNTER — Telehealth: Payer: Self-pay | Admitting: *Deleted

## 2018-09-29 ENCOUNTER — Telehealth: Payer: Self-pay | Admitting: Neurology

## 2018-09-29 NOTE — Telephone Encounter (Signed)
Pt called in to schedule her appointment for neck pain,shoulder pain she wa referred by Harrison Mons, PA-C I sched pt for Tues 06/30 for VV apt but pt was not happy pt stated she wants to come in to see the Dr. And wants to be seen ASAP. Patient said that she has been in the bed for over a week in severe pain and feels like she is dying and that no one cares. Pt wants to speak to Dr. Jaynee Eagles or her nurse. Please call pt at [phone]

## 2018-09-29 NOTE — Telephone Encounter (Signed)
Received neuropsych testing results from [facility]. Ready for Dr. Cathren Laine review. Copy sent to medical records for scanning.

## 2018-09-29 NOTE — Telephone Encounter (Signed)
I called [facility] and spoke with Lauren. She will fax the report to [phone].

## 2018-09-29 NOTE — Telephone Encounter (Signed)
I spoke to [employer]. We are going to have her come in for an emg/ncs. Chelle will talk to her about it tomorrow. When is my next emg/ncs if she agrees can I squeeze her in this Thursday?

## 2018-10-01 ENCOUNTER — Telehealth: Payer: Self-pay

## 2018-10-01 NOTE — Telephone Encounter (Signed)
Spoke with patient and confirmed Nerve conduction study for 10-02-18

## 2018-10-02 ENCOUNTER — Other Ambulatory Visit: Payer: Self-pay

## 2018-10-02 ENCOUNTER — Encounter

## 2018-10-02 ENCOUNTER — Ambulatory Visit: Payer: Managed Care, Other (non HMO) | Admitting: Neurology

## 2018-10-02 ENCOUNTER — Ambulatory Visit (INDEPENDENT_AMBULATORY_CARE_PROVIDER_SITE_OTHER): Payer: Managed Care, Other (non HMO) | Admitting: Neurology

## 2018-10-02 DIAGNOSIS — Z0289 Encounter for other administrative examinations: Secondary | ICD-10-CM

## 2018-10-02 DIAGNOSIS — F449 Dissociative and conversion disorder, unspecified: Secondary | ICD-10-CM

## 2018-10-02 DIAGNOSIS — R52 Pain, unspecified: Secondary | ICD-10-CM | POA: Diagnosis not present

## 2018-10-02 DIAGNOSIS — M79602 Pain in left arm: Secondary | ICD-10-CM | POA: Diagnosis not present

## 2018-10-02 DIAGNOSIS — M79603 Pain in arm, unspecified: Secondary | ICD-10-CM

## 2018-10-02 NOTE — Patient Instructions (Signed)
Conversion Disorder Conversion disorder is also known as functional neurological symptom disorder. It is a mental (psychiatric) condition in which a person shows symptoms of a problem with the brain, spinal cord, or nerves (nervous system), such as a stroke or seizure. However, these symptoms cannot be explained by a medical condition. This means that the symptoms are real, but the condition is not caused by an injury to the nervous system. It is important to note that the symptoms are not faked. Rather, they point to a person's underlying depression, stress, or anxiety. These symptoms are often started (triggered) by a recent or past stressful event. Conversion disorder may begin anytime from late childhood through the young adult years. This disorder is more common in females than males. What are the causes? The exact cause of this disorder is not known. What increases the risk? You are more likely to develop this condition if:  You are in a very stressful situation.  You have a psychiatric condition, such as depression, anxiety, or bipolar disorder.  You have a personality disorder, such as histrionic personality disorder, borderline personality disorder, or narcissistic personality disorder.  You have a history of childhood abuse. What are the signs or symptoms? Symptoms of this condition may include:  Muscle weakness or paralysis. If this involves the bladder muscle, it can cause difficulty urinating or loss of bladder control.  Spells of uncontrollable shaking or spells that look like seizures (pseudoseizures).  Abnormal movements, such as tremors, jerks, muscle spasms, loss of balance, or difficulty walking.  Difficulty swallowing, or a feeling of having a lump in the throat.  Loss of voice (aphonia), stuttering, or a struggle with speech movements (dysarthria).  Loss of the sensation of touch or pain.  Changes in vision, such as blindness or double vision.  Seeing or  hearing things that are not there (hallucinations).  Changes in hearing, including deafness. Symptoms usually occur suddenly, often around times of intense stress. How is this diagnosed? This condition is diagnosed based on an exam by your health care provider. Exams and tests will also be done to make sure you do not have serious physical health problems. These exams and tests will vary depending on your specific symptoms. They may include:  A physical exam. This involves tests to check for problems with the brain or another part of the nervous system (neurological exam).  Blood and urine tests.  X-rays or other imaging studies.  An electroencephalogram (EEG). This monitors brain waves to look for seizures. Your health care provider may refer you to a mental health specialist for more tests. How is this treated? The main goal of treatment is to get the symptoms to go away as soon as possible. For most people, this condition may be treated with:  Relaxation techniques.  Reassurance from the health care provider that the symptoms are stress-related. For people with symptoms that do not go away, treatment options may include:  Counseling. Acceptance of the diagnosis is the first step toward successful treatment of the disorder.  Medicine. Antidepressant medicine may be helpful even if a person with conversion symptoms is not depressed.  Cognitive behavioral therapy (CBT). This involves discussing stressful life events that may have triggered the symptoms and talking about ways to cope with the stress.  Physical therapy (PT) and occupational therapy (OT). OT can help to restore strength, and PT can improve walking if your strength and the way that you walk (gait) have been affected.  Amobarbital interview (rare). This involves  discussing stressful life events that may have triggered the symptoms. The mental health specialist asks you questions after you take a short-acting medicine called  amobarbital. This medicine puts a person in a hypnotic state. Follow these instructions at home:  Take over-the-counter and prescription medicines only as told by your health care provider.  Work with your health care provider to identify and reduce stress (use stress management).  Keep all follow-up visits as told by your health care provider. This is important. Follow-up visits may include counseling or physical therapy sessions. Contact a health care provider if:  Your symptoms do not go away or they become worse.  You develop new symptoms. Get help right away if:  You have serious thoughts about hurting yourself or someone else. If you ever feel like you may hurt yourself or others, or have thoughts about taking your own life, get help right away. You can go to your nearest emergency department or call:  Your local emergency services (911 in the [place]  A suicide crisis helpline, such as the [place] [facility] at 410-577-2166. This is open 24 hours a day. Summary  Conversion disorder is a mental (psychiatric) disorder with symptoms that wrongly suggest an injury to the brain, spinal cord, or nerves (nervous system).  Symptoms of conversion disorder may include weakness, numbness, and spells that look like seizures.  Treatment may involve counseling for stress management, talk therapy, medicines, and physical or occupational therapy.  If you ever feel like you may hurt yourself or others, or have thoughts about taking your own life, get help right away. This information is not intended to replace advice given to you by your health care provider. Make sure you discuss any questions you have with your health care provider. Document Released: 04/28/2010 Document Revised: 01/14/2017 Document Reviewed: 01/14/2017 Elsevier Interactive Patient Education  2019 [employer].
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
## 2018-10-06 DIAGNOSIS — R52 Pain, unspecified: Secondary | ICD-10-CM | POA: Insufficient documentation

## 2018-10-06 DIAGNOSIS — F449 Dissociative and conversion disorder, unspecified: Secondary | ICD-10-CM | POA: Insufficient documentation

## 2018-10-06 NOTE — Progress Notes (Signed)
See procedure note.

## 2018-10-06 NOTE — Progress Notes (Signed)
        Full Name: Tiffany [name] Gender: Female MRN #: [id number] Date of Birth: [birth year]    Visit Date: 10/02/2018 09:20 Age: 43 Years 79 Months Old Examining Physician: Sarina Ill, MD  Referring Physician: Sarina Ill, MD  History:  Left arm pain, neck pain, radiculopathy, full body pain  Summary: EMG/NCS performed on the left upper extremity. All nerves and muscles (as indicated in the following tables) were within normal limits.       Conclusion: This is a normal study  ------------------------------- Sarina Ill M.D.  [facility] [place], [place] [address] Tel: [phone] Fax: [phone]        [employer]    Nerve / Sites Muscle Latency Ref. Amplitude Ref. Rel Amp Segments Distance Velocity Ref. Area    ms ms mV mV %  cm m/s m/s mVms  L Median - APB     Wrist APB 2.7 ?4.4 11.2 ?4.0 100 Wrist - APB 7   46.7     Upper arm APB 6.4  10.7  95.7 Upper arm - Wrist 22 59 ?49 42.9  L Ulnar - ADM     Wrist ADM 2.0 ?3.3 12.2 ?6.0 100 Wrist - ADM 7   43.4     B.Elbow ADM 5.2  11.2  91.7 B.Elbow - Wrist 20 63 ?49 41.3     A.Elbow ADM 6.9  11.3  101 A.Elbow - B.Elbow 10 60 ?49 41.4         A.Elbow - Wrist             SNC    Nerve / Sites Rec. Site Peak Lat Ref.  Amp Ref. Segments Distance    ms ms V V  cm  L Median - Orthodromic (Dig II, Mid palm)     Dig II Wrist 2.7 ?3.4 15 ?10 Dig II - Wrist 13  L Ulnar - Orthodromic, (Dig V, Mid palm)     Dig V Wrist 2.6 ?3.1 9 ?5 Dig V - Wrist 100         F  Wave    Nerve F Lat Ref.   ms ms  L Ulnar - ADM 24.8 ?32.0       EMG full       EMG Summary Table    Spontaneous MUAP Recruitment  Muscle IA Fib PSW Fasc Other Amp Dur. Poly Pattern  L. Deltoid Normal None None None _______ Normal Normal Normal Normal  L. Triceps brachii Normal None None None _______ Normal Normal Normal Normal  L. Opponens pollicis Normal None None None _______ Normal Normal Normal Normal  L. Pronator teres Normal None  None None _______ Normal Normal Normal Normal  L. First dorsal interosseous Normal None None None _______ Normal Normal Normal Normal  L. Cervical paraspinals (low) Normal None None None _______ Normal Normal Normal Normal

## 2018-10-07 ENCOUNTER — Telehealth: Payer: Managed Care, Other (non HMO) | Admitting: Neurology

## 2018-10-21 ENCOUNTER — Other Ambulatory Visit: Payer: Self-pay

## 2018-10-21 ENCOUNTER — Ambulatory Visit (INDEPENDENT_AMBULATORY_CARE_PROVIDER_SITE_OTHER): Payer: Managed Care, Other (non HMO) | Admitting: Infectious Disease

## 2018-10-21 DIAGNOSIS — R5383 Other fatigue: Secondary | ICD-10-CM

## 2018-10-21 DIAGNOSIS — F329 Major depressive disorder, single episode, unspecified: Secondary | ICD-10-CM

## 2018-10-21 NOTE — Progress Notes (Signed)
Patient did not pick up the phone after I called her on 3 different times over 30 to 40-minute.

## 2018-12-14 ENCOUNTER — Other Ambulatory Visit: Payer: Self-pay

## 2018-12-14 ENCOUNTER — Ambulatory Visit (INDEPENDENT_AMBULATORY_CARE_PROVIDER_SITE_OTHER): Payer: Managed Care, Other (non HMO)

## 2018-12-14 ENCOUNTER — Ambulatory Visit (HOSPITAL_COMMUNITY)
Admission: EM | Admit: 2018-12-14 | Discharge: 2018-12-14 | Disposition: A | Discharge status: Home / Self Care | Payer: Managed Care, Other (non HMO) | Attending: Family Medicine | Admitting: Family Medicine

## 2018-12-14 ENCOUNTER — Encounter (HOSPITAL_COMMUNITY): Payer: Self-pay

## 2018-12-14 DIAGNOSIS — W19XXXA Unspecified fall, initial encounter: Secondary | ICD-10-CM

## 2018-12-14 DIAGNOSIS — M25551 Pain in right hip: Secondary | ICD-10-CM

## 2018-12-14 MED ORDER — KETOROLAC TROMETHAMINE 60 MG/2ML IM SOLN
60.0000 mg | Freq: Once | INTRAMUSCULAR | Status: AC
  Administered 2018-12-14: 17:00:00 60 mg via INTRAMUSCULAR

## 2018-12-14 MED ORDER — KETOROLAC TROMETHAMINE 60 MG/2ML IM SOLN
INTRAMUSCULAR | Status: AC
  Filled 2018-12-14: qty 2

## 2018-12-14 NOTE — Discharge Instructions (Signed)
Please try ice  Please try ibuprofen for pain  Please follow up if your pain doesn't improve.

## 2018-12-14 NOTE — ED Provider Notes (Signed)
[place]    CSN: [id number] Arrival date & time: 12/14/18  1512      History   Chief Complaint Chief Complaint  Patient presents with  . Fall  . Hip Pain    HPI Tiffany [name] is a 43 y.o. female.   She is presenting with right hip pain.  She had a fall a few days ago.  Her pain is occurring over the lateral aspect as well as the anterior aspect of the right hip.  The pain is severe and constant in nature.  No improvement with conservative measures.  Any form of movement causes severe worsening of the pain.  Also having some reported numbness down the lower leg.  No history of surgery.  Symptoms are more constant and severe nature.  HPI  Past Medical History:  Diagnosis Date  . Abnormal Pap smear 1997   LAST PAP 10/2010  . Allergy    SEASONAL  . Anemia    CHRONIC  . 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 Arthritis   . Blood dyscrasia    SICKLE CELL TRAIT  . Blood transfusion without reported diagnosis 2005   after vaginal delivery  . Breast mass in female 2003  . Broken foot 03/16/2016   right  . Bruises easily   . BV (bacterial vaginosis)   . Cancer ([place])   . Cyst of ovary 2003  . Depression 2003  . DUB (dysfunctional uterine bleeding) 2003  . DVT, lower extremity ([place]) 10/2014   left leg  . Fatigue 06/10/2018  . Fibroid    LONG TERM DX  . Fibromyalgia 2004  . Fibrositis   . GERD (gastroesophageal reflux disease) 2007  . H/O amenorrhea 2006  . H/O menorrhagia 2011  . H/O sickle cell trait   . Heart murmur 2007  . History of abnormal cervical Pap smear 2003   had colposcopy and cryo.  Follow up paps are normal.  . Hx: UTI (urinary tract infection) 2003  . Hypertension 2005   RESOLVED WITH WT LOSS  . IBD (inflammatory bowel disease)   . IBS (irritable bowel syndrome)   . Infection    OCC YEAST  . Low back pain 2003  . Menses, irregular 2003  . Migraine   . Nausea and vomiting 06/10/2018  . Neck pain 06/10/2018  . Night sweats 06/10/2018  . Obese  2003  . Pulmonary embolism ([place]) 10/2014  . Sacroiliitis ([place])   . Sickle-cell trait ([place])   . Suspected Covid-19 Virus Infection 07/28/2018  . Trichomonas   . Uterine leiomyoma   . Vaginitis and vulvovaginitis 2004  . Weight loss 06/10/2018  . Yeast vaginitis 04/24/2010    Patient Active Problem List   Diagnosis Date Noted  . Conversion disorder 10/06/2018  . Complaints of total body pain 10/06/2018  . Suspected Covid-19 Virus Infection 07/28/2018  . Cognitive changes 07/03/2018  . Attention deficit disorder 07/03/2018  . Cyst of ovary 06/10/2018  . Weight loss 06/10/2018  . Neck pain 06/10/2018  . Night sweats 06/10/2018  . Fatigue 06/10/2018  . Nausea and vomiting 06/10/2018  . ASCUS with positive high risk HPV cervical 12/17/2017  . Anemia 11/27/2017  . Palpitations 11/28/2016  . Vitamin D deficiency 11/28/2016  . Abdominal bloating 12/27/2015  . Abnormal weight gain 12/23/2015  . Peripheral edema 11/22/2015  . Atypical chest pain 11/10/2015  . Gastric polyps 11/10/2015  . Hypokalemia 08/15/2015  . Seasonal allergic rhinitis due to pollen 07/05/2015  .  Sickle cell trait ([place]) 07/05/2015  . Genetic testing 06/13/2015  . Family history of breast cancer 06/02/2015  . Family history of malignant neoplasm of ovary 06/02/2015  . BV (bacterial vaginosis) 02/18/2015  . Hemorrhoids 02/18/2015  . Fibrositis 11/30/2014  . Left leg DVT ([place]) 11/18/2014  . History of DVT of lower extremity 11/18/2014  . Left leg pain 11/09/2014  . History of laparoscopic adjustable gastric banding 01/29/2012  . Depression 12/19/2011  . Fibromyalgia 12/19/2011  . Arthritis 12/19/2011  . Irritable bowel syndrome 12/19/2011  . Gastroesophageal reflux disease 12/19/2011  . Uterine leiomyoma 12/19/2011  . Current moderate episode of major depressive disorder without prior episode ([place]) 12/19/2011  . Undifferentiated inflammatory arthritis ([place]) 12/19/2011  . Abnormal cervical Papanicolaou smear  04/10/1995    Past Surgical History:  Procedure Laterality Date  . AUGMENTATION MAMMAPLASTY  [id number]   Silicone implants  . broken ankle Right   . CERVICAL CONE BIOPSY  1997  . COSMETIC SURGERY  2012   thigh lift  . CYSTECTOMY Right    obtain date from patient, was not on history form dated 06/16/10--benign cyst removed Rt.ovary  . DILATION AND CURETTAGE OF UTERUS    . LAPAROSCOPIC GASTRIC BANDING  05/20/2007  . WISDOM TOOTH EXTRACTION      OB History    Gravida  12   Para  6   Term  6   Preterm      AB  6   Living  6     SAB  6   TAB      Ectopic      Multiple      Live Births  6            Home Medications    Prior to Admission medications   Medication Sig Start Date End Date Taking? Authorizing Provider  acetaminophen (TYLENOL) 500 MG tablet Take 1 tablet (500 mg total) by mouth every 6 (six) hours as needed. Patient not taking: Reported on 06/10/2018 10/14/16   Frederica Kuster, PA-C  ALPRAZolam (XANAX XR) 1 MG 24 hr tablet Take 1 mg by mouth as needed for anxiety.    [provider]  ALPRAZolam Duanne Moron) 1 MG tablet Take 1 mg by mouth at bedtime as needed for anxiety.    [provider]  amphetamine-dextroamphetamine (ADDERALL) 20 MG tablet Take 20 mg by mouth daily as needed.  06/19/16   [provider]  betamethasone valerate ointment (VALISONE) 0.1 % Apply a pea sized amount topically BID for 1-2 weeks as needed. Not for daily long term use. Patient not taking: Reported on 04/15/2018 04/30/17   Salvadore Dom, MD  cetirizine (ZYRTEC) 10 MG tablet Take 10 mg by mouth daily.  09/07/15   [provider]  fluticasone (FLONASE) 50 MCG/ACT nasal spray Place 1 spray into both nostrils daily as needed for allergies.  10/12/15   [provider]  levofloxacin (LEVAQUIN) 500 MG tablet Take 1 tablet (500 mg total) by mouth daily. Patient not taking: Reported on 07/02/2018 06/11/18   Pattricia Boss, MD  lubiprostone (AMITIZA) 24  MCG capsule Take 1 capsule (24 mcg total) by mouth daily with breakfast. Patient not taking: Reported on 04/15/2018 12/09/14   Boykin Nearing, MD  magnesium oxide (MAG-OX) 400 MG tablet Take 1 tablet (400 mg total) by mouth daily. Patient not taking: Reported on 06/10/2018 04/15/18   Isla Pence, MD  MYDAYIS 50 MG CP24 Take 50 mg by mouth daily as needed.  04/23/16  [provider]  nystatin cream (MYCOSTATIN) Apply 1 application topically 2 (two) times daily. Apply to affected area BID for up to 7 days. 09/05/16   [name], CNM  ondansetron (ZOFRAN) 4 MG tablet Take 1 tablet (4 mg total) by mouth every 8 (eight) hours as needed for nausea or vomiting. 06/15/17   Zigmund Gottron, NP  Potassium Chloride ER 20 MEQ TBCR Take 40 mEq by mouth daily. 06/11/18   Pattricia Boss, MD  promethazine (PHENERGAN) 25 MG tablet Take 1 tablet (25 mg total) by mouth every 6 (six) hours as needed for nausea or vomiting. 04/15/18   Isla Pence, MD  SPRAVATO, 56 MG DOSE, 28 MG/DEVICE SOPK Place 2 sprays into the nose every 14 (fourteen) days. 04/22/18   [provider]  vortioxetine HBr (TRINTELLIX) 10 MG TABS tablet Take 10 mg by mouth daily.    [provider]  zolpidem (AMBIEN) 10 MG tablet Take 10 mg by mouth at bedtime.    [provider]    Family History Family History  Problem Relation Age of Onset  . Diabetes Mother   . Arthritis Mother   . Hyperlipidemia Mother   . Other Mother        VARICOSE VEINS; DECREASED HEART RATE; +Hysterectomy for unspecified reason  . Fibromyalgia Mother   . Colon polyps Mother        unspecified number  . Depression Mother   . Dementia Mother   . Heart disease Father   . Hypertension Father   . Alcohol abuse Father   . Drug abuse Father   . Stroke Father   . Ovarian cancer Sister        dx. <43y  . Cervical cancer Sister 55  . Multiple sclerosis Brother   . Thyroid disease Brother        hypothyroidism  . Lung cancer  Maternal Grandmother 74       secondhand smoke  . Depression Maternal Grandmother   . Miscarriages / Stillbirths Maternal Grandmother   . Other Maternal Grandmother        binswanger's disease  . Birth defects Maternal Aunt        EXTRA DIGITS; MUSCULAR DISEASE  . Early death Maternal Aunt 26  . COPD Maternal Grandfather   . Stroke Maternal Grandfather   . Ovarian cancer Maternal Aunt 77       Dec.age 46 Ovarian CA  . Early death Maternal Aunt        d. three days after birth; unknown cause  . Breast cancer Maternal Aunt 72  . Colon polyps Maternal Aunt        unspecified number  . Liver disease Paternal Aunt   . Ovarian cancer Maternal Aunt 22  . Depression Maternal Aunt   . Heart attack Paternal Uncle        in mid-50s  . Alcohol abuse Maternal Uncle   . Breast cancer Cousin        maternal    Social History Social History   Tobacco Use  . Smoking status: Never Smoker  . Smokeless tobacco: Never Used  Substance Use Topics  . Alcohol use: Yes    Alcohol/week: 1.0 standard drinks    Types: 1 Glasses of wine per week    Comment: maybe once or twice a month  . Drug use: No     Allergies   Flexeril [cyclobenzaprine hcl], Latex, and Other   Review of Systems Review of Systems  Constitutional:  Negative for fever.  HENT: Negative for congestion.   Respiratory: Negative for cough.   Cardiovascular: Negative for chest pain.  Gastrointestinal: Negative for abdominal pain.  Musculoskeletal: Positive for gait problem.  Skin: Negative for color change.  Neurological: Negative for weakness.  Hematological: Negative for adenopathy.     Physical Exam Triage Vital Signs ED Triage Vitals  Enc Vitals Group     BP 12/14/18 1537 138/86     Pulse Rate 12/14/18 1537 68     Resp 12/14/18 1537 18     Temp 12/14/18 1537 97.8 F (36.6 C)     Temp Source 12/14/18 1537 Oral     SpO2 12/14/18 1537 99 %     Weight --      Height --      Head Circumference --      Peak  Flow --      Pain Score 12/14/18 1533 10     Pain Loc --      Pain Edu? --      Excl. in [place]? --    No data found.  Updated Vital Signs BP 138/86 (BP Location: Left Arm)   Pulse 68   Temp 97.8 F (36.6 C) (Oral)   Resp 18   LMP 12/01/2018   SpO2 99%   Visual Acuity Right Eye Distance:   Left Eye Distance:   Bilateral Distance:    Right Eye Near:   Left Eye Near:    Bilateral Near:     Physical Exam Gen: NAD, alert, cooperative with exam, well-appearing ENT: normal lips, normal nasal mucosa,  Eye: normal EOM, normal conjunctiva and lids CV:  no edema, +2 pedal pulses   Resp: no accessory muscle use, non-labored,  GI: no masses or tenderness, no hernia  Skin: no rashes, no areas of induration  Neuro: normal tone, normal sensation to touch Psych:  normal insight, alert and oriented MSK:  Right hip:  TTP over the Greater trochanter  Exam limited secondary to pain. Normal strength resistance with hip flexion. Negative straight leg raise. Neurovascular intact   UC Treatments / Results  Labs (all labs ordered are listed, but only abnormal results are displayed) Labs Reviewed - No data to display  EKG   Radiology Dg Hip Unilat With Pelvis 2-3 Views Right  Result Date: 12/14/2018 CLINICAL DATA:  Right hip pain for 2 days. No previous injury or fracture. EXAM: DG HIP (WITH OR WITHOUT PELVIS) 2-3V RIGHT COMPARISON:  None. FINDINGS: There are lucencies overlying the right femoral head which are likely artifactual. There is no definite hip fracture or dislocation. There are mild degenerative changes similar to the left hip. No acute finding in the visualized skeleton. Nonobstructive bowel gas pattern. IMPRESSION: Lucencies overlying the right femoral head are favored to be artifactual. If there is persistent concern for right hip pathology, consider CT or MRI. Electronically Signed   By: Audie Pinto M.D.   On: 12/14/2018 16:42    Procedures Procedures (including  critical care time)  Medications Ordered in UC Medications  ketorolac (TORADOL) injection 60 mg (60 mg Intramuscular Given 12/14/18 1654)  ketorolac (TORADOL) 60 MG/2ML injection (has no administration in time range)    Initial Impression / Assessment and Plan / UC Course  I have reviewed the triage vital signs and the nursing notes.  Pertinent labs & imaging results that were available during my care of the patient were reviewed by me and considered in my medical decision making (see chart for details).  Tyranny is a 43 year old female is presenting with right hip pain.  Likely contusion as she fell on that side.  Provided with Toradol in clinic.  Counseled on supportive care.  Given indications to follow-up.  Provided with word to follow-up if symptoms are ongoing. Final Clinical Impressions(s) / UC Diagnoses   Final diagnoses:  Right hip pain     Discharge Instructions     Please try ice  Please try ibuprofen for pain  Please follow up if your pain doesn't improve.     ED Prescriptions    None     Controlled Substance Prescriptions [place] Controlled Substance Registry consulted? Not Applicable   Rosemarie Ax, MD 12/14/18 [phone]

## 2018-12-14 NOTE — ED Triage Notes (Signed)
Patient presents to Urgent Care with complaints of right hip pain since falling while weed eating 3 days ago. Patient reports she has been trying to stay off of it but it has not gotten any less painful.

## 2018-12-18 ENCOUNTER — Other Ambulatory Visit (HOSPITAL_COMMUNITY): Payer: Self-pay | Admitting: Family Medicine

## 2018-12-18 ENCOUNTER — Other Ambulatory Visit: Payer: Self-pay | Admitting: Family Medicine

## 2018-12-18 ENCOUNTER — Ambulatory Visit (HOSPITAL_COMMUNITY)
Admission: RE | Admit: 2018-12-18 | Discharge: 2018-12-18 | Disposition: A | Discharge status: Home / Self Care | Payer: Managed Care, Other (non HMO) | Source: Ambulatory Visit | Attending: Family Medicine | Admitting: Family Medicine

## 2018-12-18 ENCOUNTER — Other Ambulatory Visit: Payer: Self-pay

## 2018-12-18 DIAGNOSIS — M25551 Pain in right hip: Secondary | ICD-10-CM

## 2018-12-18 DIAGNOSIS — M79604 Pain in right leg: Secondary | ICD-10-CM | POA: Insufficient documentation

## 2018-12-18 DIAGNOSIS — M7989 Other specified soft tissue disorders: Secondary | ICD-10-CM | POA: Diagnosis not present

## 2018-12-18 NOTE — Progress Notes (Addendum)
VASCULAR LAB PRELIMINARY  PRELIMINARY  PRELIMINARY  PRELIMINARY  Right lower extremity venous duplex completed.    Preliminary report:  See CV proc for preliminary results.   Called results to Dr. Dolores Lory, [facility], RVT 12/18/2018, 11:54 AM

## 2018-12-19 ENCOUNTER — Other Ambulatory Visit: Payer: Self-pay | Admitting: Family Medicine

## 2018-12-22 ENCOUNTER — Other Ambulatory Visit: Payer: Self-pay

## 2018-12-22 ENCOUNTER — Ambulatory Visit (HOSPITAL_COMMUNITY)
Admission: RE | Admit: 2018-12-22 | Discharge: 2018-12-22 | Disposition: A | Discharge status: Home / Self Care | Payer: Managed Care, Other (non HMO) | Source: Ambulatory Visit | Attending: Family Medicine | Admitting: Family Medicine

## 2018-12-22 DIAGNOSIS — M25551 Pain in right hip: Secondary | ICD-10-CM

## 2018-12-25 ENCOUNTER — Inpatient Hospital Stay (HOSPITAL_COMMUNITY): Payer: Managed Care, Other (non HMO) | Admitting: Certified Registered Nurse Anesthetist

## 2018-12-25 ENCOUNTER — Other Ambulatory Visit: Payer: Self-pay | Admitting: Radiation Oncology

## 2018-12-25 ENCOUNTER — Other Ambulatory Visit (HOSPITAL_COMMUNITY)
Admission: RE | Admit: 2018-12-25 | Discharge: 2018-12-25 | Disposition: A | Discharge status: Home / Self Care | Payer: Managed Care, Other (non HMO) | Source: Ambulatory Visit | Attending: Orthopedic Surgery | Admitting: Orthopedic Surgery

## 2018-12-25 ENCOUNTER — Inpatient Hospital Stay (HOSPITAL_COMMUNITY): Payer: Managed Care, Other (non HMO)

## 2018-12-25 ENCOUNTER — Encounter (HOSPITAL_COMMUNITY)
Admission: RE | Disposition: A | Discharge status: Home Health | Payer: Self-pay | Source: Home / Self Care | Attending: Orthopedic Surgery

## 2018-12-25 ENCOUNTER — Other Ambulatory Visit (HOSPITAL_COMMUNITY): Payer: Self-pay

## 2018-12-25 ENCOUNTER — Inpatient Hospital Stay (HOSPITAL_COMMUNITY)
Admission: RE | Admit: 2018-12-25 | Discharge: 2018-12-29 | DRG: 516 | Disposition: A | Discharge status: Home Health | Payer: Managed Care, Other (non HMO) | Attending: Orthopedic Surgery | Admitting: Orthopedic Surgery

## 2018-12-25 ENCOUNTER — Encounter (HOSPITAL_COMMUNITY): Payer: Self-pay | Admitting: *Deleted

## 2018-12-25 ENCOUNTER — Other Ambulatory Visit: Payer: Self-pay

## 2018-12-25 DIAGNOSIS — K589 Irritable bowel syndrome without diarrhea: Secondary | ICD-10-CM | POA: Diagnosis present

## 2018-12-25 DIAGNOSIS — Z8249 Family history of ischemic heart disease and other diseases of the circulatory system: Secondary | ICD-10-CM | POA: Diagnosis not present

## 2018-12-25 DIAGNOSIS — Y93H2 Activity, gardening and landscaping: Secondary | ICD-10-CM | POA: Diagnosis not present

## 2018-12-25 DIAGNOSIS — M797 Fibromyalgia: Secondary | ICD-10-CM | POA: Diagnosis present

## 2018-12-25 DIAGNOSIS — Y92017 Garden or yard in single-family (private) house as the place of occurrence of the external cause: Secondary | ICD-10-CM

## 2018-12-25 DIAGNOSIS — Z86718 Personal history of other venous thrombosis and embolism: Secondary | ICD-10-CM | POA: Diagnosis not present

## 2018-12-25 DIAGNOSIS — K219 Gastro-esophageal reflux disease without esophagitis: Secondary | ICD-10-CM | POA: Diagnosis present

## 2018-12-25 DIAGNOSIS — Z825 Family history of asthma and other chronic lower respiratory diseases: Secondary | ICD-10-CM | POA: Diagnosis not present

## 2018-12-25 DIAGNOSIS — Z419 Encounter for procedure for purposes other than remedying health state, unspecified: Secondary | ICD-10-CM

## 2018-12-25 DIAGNOSIS — Z888 Allergy status to other drugs, medicaments and biological substances status: Secondary | ICD-10-CM

## 2018-12-25 DIAGNOSIS — R110 Nausea: Secondary | ICD-10-CM | POA: Diagnosis not present

## 2018-12-25 DIAGNOSIS — J302 Other seasonal allergic rhinitis: Secondary | ICD-10-CM | POA: Diagnosis present

## 2018-12-25 DIAGNOSIS — Z833 Family history of diabetes mellitus: Secondary | ICD-10-CM | POA: Diagnosis not present

## 2018-12-25 DIAGNOSIS — S32401A Unspecified fracture of right acetabulum, initial encounter for closed fracture: Secondary | ICD-10-CM

## 2018-12-25 DIAGNOSIS — Z823 Family history of stroke: Secondary | ICD-10-CM | POA: Diagnosis not present

## 2018-12-25 DIAGNOSIS — W1781XA Fall down embankment (hill), initial encounter: Secondary | ICD-10-CM | POA: Diagnosis not present

## 2018-12-25 DIAGNOSIS — Z8349 Family history of other endocrine, nutritional and metabolic diseases: Secondary | ICD-10-CM

## 2018-12-25 DIAGNOSIS — F329 Major depressive disorder, single episode, unspecified: Secondary | ICD-10-CM | POA: Diagnosis present

## 2018-12-25 DIAGNOSIS — Z820 Family history of epilepsy and other diseases of the nervous system: Secondary | ICD-10-CM

## 2018-12-25 DIAGNOSIS — Z9884 Bariatric surgery status: Secondary | ICD-10-CM

## 2018-12-25 DIAGNOSIS — S32421A Displaced fracture of posterior wall of right acetabulum, initial encounter for closed fracture: Principal | ICD-10-CM | POA: Diagnosis present

## 2018-12-25 DIAGNOSIS — Z01818 Encounter for other preprocedural examination: Secondary | ICD-10-CM

## 2018-12-25 DIAGNOSIS — D573 Sickle-cell trait: Secondary | ICD-10-CM | POA: Diagnosis present

## 2018-12-25 DIAGNOSIS — F32A Depression, unspecified: Secondary | ICD-10-CM | POA: Diagnosis present

## 2018-12-25 DIAGNOSIS — Z6832 Body mass index (BMI) 32.0-32.9, adult: Secondary | ICD-10-CM | POA: Diagnosis not present

## 2018-12-25 DIAGNOSIS — E8889 Other specified metabolic disorders: Secondary | ICD-10-CM | POA: Diagnosis present

## 2018-12-25 DIAGNOSIS — Z20828 Contact with and (suspected) exposure to other viral communicable diseases: Secondary | ICD-10-CM | POA: Diagnosis present

## 2018-12-25 DIAGNOSIS — F419 Anxiety disorder, unspecified: Secondary | ICD-10-CM | POA: Diagnosis present

## 2018-12-25 DIAGNOSIS — D62 Acute posthemorrhagic anemia: Secondary | ICD-10-CM | POA: Diagnosis not present

## 2018-12-25 DIAGNOSIS — Z298 Encounter for other specified prophylactic measures: Secondary | ICD-10-CM | POA: Diagnosis not present

## 2018-12-25 DIAGNOSIS — Z8261 Family history of arthritis: Secondary | ICD-10-CM | POA: Diagnosis not present

## 2018-12-25 DIAGNOSIS — Z79899 Other long term (current) drug therapy: Secondary | ICD-10-CM

## 2018-12-25 DIAGNOSIS — E559 Vitamin D deficiency, unspecified: Secondary | ICD-10-CM | POA: Diagnosis present

## 2018-12-25 DIAGNOSIS — Z91040 Latex allergy status: Secondary | ICD-10-CM

## 2018-12-25 DIAGNOSIS — E669 Obesity, unspecified: Secondary | ICD-10-CM | POA: Diagnosis present

## 2018-12-25 HISTORY — PX: ORIF ACETABULAR FRACTURE: SHX5029

## 2018-12-25 LAB — PROTIME-INR
INR: 1.1 (ref 0.8–1.2)
Prothrombin Time: 14.5 seconds (ref 11.4–15.2)

## 2018-12-25 LAB — COMPREHENSIVE METABOLIC PANEL
ALT: 10 U/L (ref 0–44)
AST: 16 U/L (ref 15–41)
Albumin: 3.1 g/dL — ABNORMAL LOW (ref 3.5–5.0)
Alkaline Phosphatase: 58 U/L (ref 38–126)
Anion gap: 11 (ref 5–15)
BUN: 6 mg/dL (ref 6–20)
CO2: 19 mmol/L — ABNORMAL LOW (ref 22–32)
Calcium: 8.3 mg/dL — ABNORMAL LOW (ref 8.9–10.3)
Chloride: 105 mmol/L (ref 98–111)
Creatinine, Ser: 0.64 mg/dL (ref 0.44–1.00)
GFR calc Af Amer: >60 mL/min (ref >60)
GFR calc non Af Amer: >60 mL/min (ref >60)
Glucose, Bld: 71 mg/dL (ref 70–99)
Potassium: 3.9 mmol/L (ref 3.5–5.1)
Sodium: 135 mmol/L (ref 135–145)
Total Bilirubin: 0.4 mg/dL (ref 0.3–1.2)
Total Protein: 5.8 g/dL — ABNORMAL LOW (ref 6.5–8.1)

## 2018-12-25 LAB — URINALYSIS, ROUTINE W REFLEX MICROSCOPIC
Bilirubin Urine: NEGATIVE
Glucose, UA: NEGATIVE mg/dL
Hgb urine dipstick: NEGATIVE
Ketones, ur: NEGATIVE mg/dL
Nitrite: NEGATIVE
Protein, ur: 30 mg/dL — AB
Specific Gravity, Urine: 1.018 (ref 1.005–1.030)
pH: 5 (ref 5.0–8.0)

## 2018-12-25 LAB — POCT I-STAT 4, (NA,K, GLUC, HGB,HCT)
Glucose, Bld: 103 mg/dL — ABNORMAL HIGH (ref 70–99)
Glucose, Bld: 117 mg/dL — ABNORMAL HIGH (ref 70–99)
HCT: 27 % — ABNORMAL LOW (ref 36.0–46.0)
HCT: 29 % — ABNORMAL LOW (ref 36.0–46.0)
Hemoglobin: 9.2 g/dL — ABNORMAL LOW (ref 12.0–15.0)
Hemoglobin: 9.9 g/dL — ABNORMAL LOW (ref 12.0–15.0)
Potassium: 3.6 mmol/L (ref 3.5–5.1)
Potassium: 3.4 mmol/L — ABNORMAL LOW (ref 3.5–5.1)
Sodium: 138 mmol/L (ref 135–145)
Sodium: 137 mmol/L (ref 135–145)

## 2018-12-25 LAB — TYPE AND SCREEN
ABO/RH(D): A POS
Antibody Screen: NEGATIVE

## 2018-12-25 LAB — CBC
HCT: 28.2 % — ABNORMAL LOW (ref 36.0–46.0)
Hemoglobin: 9.3 g/dL — ABNORMAL LOW (ref 12.0–15.0)
MCH: 26 pg (ref 26.0–34.0)
MCHC: 33 g/dL (ref 30.0–36.0)
MCV: 78.8 fL — ABNORMAL LOW (ref 80.0–100.0)
Platelets: 343 10*3/uL (ref 150–400)
RBC: 3.58 MIL/uL — ABNORMAL LOW (ref 3.87–5.11)
RDW: 19.9 % — ABNORMAL HIGH (ref 11.5–15.5)
WBC: 4.2 10*3/uL (ref 4.0–10.5)
nRBC: 0 % (ref 0.0–0.2)

## 2018-12-25 LAB — APTT: aPTT: 32 seconds (ref 24–36)

## 2018-12-25 LAB — ABO/RH: ABO/RH(D): A POS

## 2018-12-25 LAB — POCT PREGNANCY, URINE: Preg Test, Ur: NEGATIVE

## 2018-12-25 LAB — SARS CORONAVIRUS 2 BY RT PCR (HOSPITAL ORDER, PERFORMED IN ~~LOC~~ HOSPITAL LAB): SARS Coronavirus 2: NEGATIVE

## 2018-12-25 SURGERY — OPEN REDUCTION INTERNAL FIXATION (ORIF) ACETABULAR FRACTURE
Anesthesia: General | Site: Hip | Laterality: Right

## 2018-12-25 MED ORDER — MIDAZOLAM HCL 5 MG/5ML IJ SOLN
INTRAMUSCULAR | Status: DC | PRN
  Administered 2018-12-25: 2 mg via INTRAVENOUS

## 2018-12-25 MED ORDER — ROCURONIUM BROMIDE 10 MG/ML (PF) SYRINGE
PREFILLED_SYRINGE | INTRAVENOUS | Status: AC
  Filled 2018-12-25: qty 30

## 2018-12-25 MED ORDER — ACETAMINOPHEN 500 MG PO TABS
1000.0000 mg | ORAL_TABLET | Freq: Once | ORAL | Status: AC
  Administered 2018-12-25: 16:00:00 1000 mg via ORAL
  Filled 2018-12-25: qty 2

## 2018-12-25 MED ORDER — LIDOCAINE 2% (20 MG/ML) 5 ML SYRINGE
INTRAMUSCULAR | Status: AC
  Filled 2018-12-25: qty 5

## 2018-12-25 MED ORDER — SUCCINYLCHOLINE CHLORIDE 200 MG/10ML IV SOSY
PREFILLED_SYRINGE | INTRAVENOUS | Status: AC
  Filled 2018-12-25: qty 10

## 2018-12-25 MED ORDER — PROMETHAZINE HCL 25 MG/ML IJ SOLN: 6.2500 mg | INTRAMUSCULAR | Status: DC | PRN

## 2018-12-25 MED ORDER — GLYCOPYRROLATE 0.2 MG/ML IJ SOLN
INTRAMUSCULAR | Status: DC | PRN
  Administered 2018-12-25: 0.2 mg via INTRAVENOUS

## 2018-12-25 MED ORDER — SUGAMMADEX SODIUM 500 MG/5ML IV SOLN
INTRAVENOUS | Status: AC
  Filled 2018-12-25: qty 5

## 2018-12-25 MED ORDER — LACTATED RINGERS IV SOLN
INTRAVENOUS | Status: DC
  Administered 2018-12-25 (×3): via INTRAVENOUS

## 2018-12-25 MED ORDER — SUGAMMADEX SODIUM 200 MG/2ML IV SOLN
INTRAVENOUS | Status: DC | PRN
  Administered 2018-12-25: 200 mg via INTRAVENOUS

## 2018-12-25 MED ORDER — CHLORHEXIDINE GLUCONATE 4 % EX LIQD: 60.0000 mL | Freq: Once | CUTANEOUS | Status: DC

## 2018-12-25 MED ORDER — SODIUM CHLORIDE 0.9 % IV SOLN
INTRAVENOUS | Status: DC | PRN
  Administered 2018-12-25: 21:00:00 50 ug/min via INTRAVENOUS

## 2018-12-25 MED ORDER — ACETAMINOPHEN 10 MG/ML IV SOLN
1000.0000 mg | Freq: Once | INTRAVENOUS | Status: AC
  Administered 2018-12-25: 23:00:00 1000 mg via INTRAVENOUS

## 2018-12-25 MED ORDER — HYDROMORPHONE HCL 1 MG/ML IJ SOLN
0.5000 mg | INTRAMUSCULAR | Status: DC | PRN
  Administered 2018-12-25 (×2): 0.5 mg via INTRAVENOUS

## 2018-12-25 MED ORDER — HYDROMORPHONE HCL 1 MG/ML IJ SOLN
INTRAMUSCULAR | Status: AC
  Filled 2018-12-25: qty 1

## 2018-12-25 MED ORDER — LIDOCAINE HCL (CARDIAC) PF 100 MG/5ML IV SOSY
PREFILLED_SYRINGE | INTRAVENOUS | Status: DC | PRN
  Administered 2018-12-25: 60 mg via INTRAVENOUS

## 2018-12-25 MED ORDER — 0.9 % SODIUM CHLORIDE (POUR BTL) OPTIME
TOPICAL | Status: DC | PRN
  Administered 2018-12-25: 1000 mL

## 2018-12-25 MED ORDER — POVIDONE-IODINE 10 % EX SWAB: 2.0000 "application " | Freq: Once | CUTANEOUS | Status: DC

## 2018-12-25 MED ORDER — PROPOFOL 10 MG/ML IV BOLUS
INTRAVENOUS | Status: AC
  Filled 2018-12-25: qty 20

## 2018-12-25 MED ORDER — SUCCINYLCHOLINE CHLORIDE 20 MG/ML IJ SOLN
INTRAMUSCULAR | Status: DC | PRN
  Administered 2018-12-25: 100 mg via INTRAVENOUS

## 2018-12-25 MED ORDER — FENTANYL CITRATE (PF) 250 MCG/5ML IJ SOLN
INTRAMUSCULAR | Status: AC
  Filled 2018-12-25: qty 5

## 2018-12-25 MED ORDER — ONDANSETRON HCL 4 MG/2ML IJ SOLN
INTRAMUSCULAR | Status: DC | PRN
  Administered 2018-12-25: 4 mg via INTRAVENOUS

## 2018-12-25 MED ORDER — ACETAMINOPHEN 10 MG/ML IV SOLN
INTRAVENOUS | Status: AC
  Filled 2018-12-25: qty 100

## 2018-12-25 MED ORDER — ONDANSETRON HCL 4 MG/2ML IJ SOLN
INTRAMUSCULAR | Status: AC
  Filled 2018-12-25: qty 2

## 2018-12-25 MED ORDER — FENTANYL CITRATE (PF) 100 MCG/2ML IJ SOLN
INTRAMUSCULAR | Status: DC | PRN
  Administered 2018-12-25: 50 ug via INTRAVENOUS
  Administered 2018-12-25 (×3): 100 ug via INTRAVENOUS

## 2018-12-25 MED ORDER — FENTANYL CITRATE (PF) 100 MCG/2ML IJ SOLN
INTRAMUSCULAR | Status: AC
  Filled 2018-12-25: qty 2

## 2018-12-25 MED ORDER — FENTANYL CITRATE (PF) 100 MCG/2ML IJ SOLN
25.0000 ug | INTRAMUSCULAR | Status: DC | PRN
  Administered 2018-12-25 (×3): 50 ug via INTRAVENOUS

## 2018-12-25 MED ORDER — ROCURONIUM BROMIDE 100 MG/10ML IV SOLN
INTRAVENOUS | Status: DC | PRN
  Administered 2018-12-25 (×3): 20 mg via INTRAVENOUS
  Administered 2018-12-25: 30 mg via INTRAVENOUS

## 2018-12-25 MED ORDER — DEXAMETHASONE SODIUM PHOSPHATE 10 MG/ML IJ SOLN
INTRAMUSCULAR | Status: DC | PRN
  Administered 2018-12-25: 10 mg via INTRAVENOUS

## 2018-12-25 MED ORDER — CEFAZOLIN SODIUM-DEXTROSE 2-4 GM/100ML-% IV SOLN
2.0000 g | INTRAVENOUS | Status: AC
  Administered 2018-12-25: 19:00:00 2 g via INTRAVENOUS
  Filled 2018-12-25: qty 100

## 2018-12-25 MED ORDER — MIDAZOLAM HCL 2 MG/2ML IJ SOLN
INTRAMUSCULAR | Status: AC
  Filled 2018-12-25: qty 2

## 2018-12-25 SURGICAL SUPPLY — 69 items
BIT DRILL AO MATTA 2.5MX230M (BIT) IMPLANT
BIT DRILL STEP 3.5 (DRILL) IMPLANT
BLADE CLIPPER SURG (BLADE) IMPLANT
BONE CANC CHIPS 20CC PCAN1/4 (Bone Implant) ×3 IMPLANT
BRUSH SCRUB EZ PLAIN DRY (MISCELLANEOUS) ×6 IMPLANT
CHIPS CANC BONE 20CC PCAN1/4 (Bone Implant) ×1 IMPLANT
CLOSURE WOUND 1/2 X4 (GAUZE/BANDAGES/DRESSINGS) ×1
COVER SURGICAL LIGHT HANDLE (MISCELLANEOUS) ×3 IMPLANT
COVER WAND RF STERILE (DRAPES) ×3 IMPLANT
DRAPE C-ARM 42X72 X-RAY (DRAPES) ×3 IMPLANT
DRAPE C-ARMOR (DRAPES) ×3 IMPLANT
DRAPE INCISE IOBAN 66X45 STRL (DRAPES) ×3 IMPLANT
DRAPE INCISE IOBAN 85X60 (DRAPES) ×3 IMPLANT
DRAPE ORTHO SPLIT 77X108 STRL (DRAPES) ×6
DRAPE SURG ORHT 6 SPLT 77X108 (DRAPES) ×2 IMPLANT
DRAPE U-SHAPE 47X51 STRL (DRAPES) ×3 IMPLANT
DRILL BIT AO MATTA 2.5MX230M (BIT) ×3
DRILL STEP 3.5 (DRILL)
DRSG MEPILEX BORDER 4X12 (GAUZE/BANDAGES/DRESSINGS) ×2 IMPLANT
DRSG MEPILEX BORDER 4X8 (GAUZE/BANDAGES/DRESSINGS) IMPLANT
ELECT BLADE 6.5 EXT (BLADE) ×3 IMPLANT
ELECT REM PT RETURN 9FT ADLT (ELECTROSURGICAL) ×3
ELECTRODE REM PT RTRN 9FT ADLT (ELECTROSURGICAL) ×1 IMPLANT
GLOVE BIO SURGEON STRL SZ7.5 (GLOVE) ×3 IMPLANT
GLOVE BIO SURGEON STRL SZ8 (GLOVE) ×3 IMPLANT
GLOVE BIOGEL PI IND STRL 7.5 (GLOVE) ×1 IMPLANT
GLOVE BIOGEL PI IND STRL 8 (GLOVE) ×1 IMPLANT
GLOVE BIOGEL PI INDICATOR 7.5 (GLOVE) ×2
GLOVE BIOGEL PI INDICATOR 8 (GLOVE) ×2
GOWN STRL REUS W/ TWL LRG LVL3 (GOWN DISPOSABLE) ×2 IMPLANT
GOWN STRL REUS W/ TWL XL LVL3 (GOWN DISPOSABLE) ×2 IMPLANT
GOWN STRL REUS W/TWL LRG LVL3 (GOWN DISPOSABLE) ×6
GOWN STRL REUS W/TWL XL LVL3 (GOWN DISPOSABLE) ×6
GRAFT BNE CANC CHIPS 1-8 20CC (Bone Implant) IMPLANT
HANDPIECE INTERPULSE COAX TIP (DISPOSABLE) ×3
KIT BASIN OR (CUSTOM PROCEDURE TRAY) ×3 IMPLANT
KIT TURNOVER KIT B (KITS) ×3 IMPLANT
MANIFOLD NEPTUNE II (INSTRUMENTS) ×3 IMPLANT
NS IRRIG 1000ML POUR BTL (IV SOLUTION) ×3 IMPLANT
PACK TOTAL JOINT (CUSTOM PROCEDURE TRAY) ×3 IMPLANT
PAD ARMBOARD 7.5X6 YLW CONV (MISCELLANEOUS) ×6 IMPLANT
PIN APEX 6X180MM EXFIX (EXFIX) ×2 IMPLANT
PLATE ACET STRT 94.5M 8H (Plate) ×2 IMPLANT
PLATE SPRING 3.5MM 3H STRL (Plate) ×2 IMPLANT
RETRIEVER SUT HEWSON (MISCELLANEOUS) ×3 IMPLANT
SCREW CORTEX ST MATTA 3.5X20 (Screw) ×2 IMPLANT
SCREW CORTEX ST MATTA 3.5X30MM (Screw) ×6 IMPLANT
SCREW CORTEX ST MATTA 3.5X40MM (Screw) ×4 IMPLANT
SET HNDPC FAN SPRY TIP SCT (DISPOSABLE) ×1 IMPLANT
SPONGE LAP 18X18 RF (DISPOSABLE) IMPLANT
STAPLER VISISTAT 35W (STAPLE) ×3 IMPLANT
STRIP CLOSURE SKIN 1/2X4 (GAUZE/BANDAGES/DRESSINGS) ×2 IMPLANT
SUCTION FRAZIER HANDLE 10FR (MISCELLANEOUS) ×2
SUCTION TUBE FRAZIER 10FR DISP (MISCELLANEOUS) ×1 IMPLANT
SUT ETHILON 2 0 PSLX (SUTURE) ×6 IMPLANT
SUT FIBERWIRE #2 38 T-5 BLUE (SUTURE) ×6
SUT VIC AB 0 CT1 27 (SUTURE) ×3
SUT VIC AB 0 CT1 27XBRD ANBCTR (SUTURE) ×1 IMPLANT
SUT VIC AB 1 CT1 18XCR BRD 8 (SUTURE) ×1 IMPLANT
SUT VIC AB 1 CT1 27 (SUTURE) ×3
SUT VIC AB 1 CT1 27XBRD ANBCTR (SUTURE) ×1 IMPLANT
SUT VIC AB 1 CT1 8-18 (SUTURE) ×3
SUT VIC AB 2-0 CT1 27 (SUTURE) ×3
SUT VIC AB 2-0 CT1 TAPERPNT 27 (SUTURE) ×1 IMPLANT
SUTURE FIBERWR #2 38 T-5 BLUE (SUTURE) ×2 IMPLANT
TOWEL GREEN STERILE (TOWEL DISPOSABLE) ×6 IMPLANT
TOWEL GREEN STERILE FF (TOWEL DISPOSABLE) ×6 IMPLANT
TRAY FOLEY MTR SLVR 16FR STAT (SET/KITS/TRAYS/PACK) IMPLANT
WATER STERILE IRR 1000ML POUR (IV SOLUTION) IMPLANT

## 2018-12-25 NOTE — Anesthesia Preprocedure Evaluation (Signed)
Anesthesia Evaluation  Patient identified by MRN, date of birth, ID band Patient awake    Reviewed: Allergy & Precautions, NPO status , Patient's Chart, lab work & pertinent test results  Airway Mallampati: II  TM Distance: >3 FB Neck ROM: Full    Dental  (+) Teeth Intact, Dental Advisory Given, Caps,    Pulmonary neg pulmonary ROS,    Pulmonary exam normal breath sounds clear to auscultation       Cardiovascular Exercise Tolerance: Good negative cardio ROS Normal cardiovascular exam Rhythm:Regular Rate:Normal     Neuro/Psych  Headaches, PSYCHIATRIC DISORDERS Anxiety Depression  Neuromuscular disease    GI/Hepatic Neg liver ROS, GERD  ,  Endo/Other  Obesity   Renal/GU negative Renal ROS     Musculoskeletal  (+) Arthritis , Fibromyalgia -left acetabular fracture   Abdominal   Peds  Hematology  (+) Blood dyscrasia, Sickle cell trait and anemia ,   Anesthesia Other Findings Day of surgery medications reviewed with the patient.  Reproductive/Obstetrics                             Anesthesia Physical Anesthesia Plan  ASA: III  Anesthesia Plan: General   Post-op Pain Management:    Induction: Intravenous  PONV Risk Score and Plan: 4 or greater and Midazolam, Dexamethasone, Ondansetron and Diphenhydramine  Airway Management Planned: Oral ETT  Additional Equipment:   Intra-op Plan:   Post-operative Plan: Extubation in OR  Informed Consent: I have reviewed the patients History and Physical, chart, labs and discussed the procedure including the risks, benefits and alternatives for the proposed anesthesia with the patient or authorized representative who has indicated his/her understanding and acceptance.     Dental advisory given  Plan Discussed with: CRNA  Anesthesia Plan Comments:         Anesthesia Quick Evaluation

## 2018-12-25 NOTE — Anesthesia Procedure Notes (Signed)
Procedure Name: Intubation Date/Time: 12/25/2018 6:41 PM Performed by: Omid Deardorff T, CRNA Pre-anesthesia Checklist: Patient identified, Emergency Drugs available, Suction available and Patient being monitored Patient Re-evaluated:Patient Re-evaluated prior to induction Oxygen Delivery Method: Circle system utilized Preoxygenation: Pre-oxygenation with 100% oxygen Induction Type: IV induction Ventilation: Mask ventilation without difficulty Laryngoscope Size: Miller and 2 Grade View: Grade II Tube type: Oral Tube size: 7.5 mm Number of attempts: 1 Airway Equipment and Method: Patient positioned with wedge pillow and Stylet Placement Confirmation: ETT inserted through vocal cords under direct vision,  positive ETCO2 and breath sounds checked- equal and bilateral Secured at: 21 cm Tube secured with: Tape Dental Injury: Teeth and Oropharynx as per pre-operative assessment

## 2018-12-25 NOTE — Transfer of Care (Signed)
Immediate Anesthesia Transfer of Care Note  Patient: Tiffany [name]  Procedure(s) Performed: OPEN REDUCTION INTERNAL FIXATION (ORIF) ACETABULAR FRACTURE (Right Hip)  Patient Location: PACU  Anesthesia Type:General  Level of Consciousness: drowsy  Airway & Oxygen Therapy: Patient Spontanous Breathing  Post-op Assessment: Report given to RN and Post -op Vital signs reviewed and stable  Post vital signs: Reviewed and stable  Last Vitals:  Vitals Value Taken Time  BP 117/72 12/25/18 2216  Temp    Pulse 93 12/25/18 2217  Resp 14 12/25/18 2217  SpO2 100 % 12/25/18 2217  Vitals shown include unvalidated device data.  Last Pain:  Vitals:   12/25/18 1458  TempSrc:   PainSc: 0-No pain      Patients Stated Pain Goal: 0 ([id number])  Complications: No apparent anesthesia complications

## 2018-12-25 NOTE — H&P (Signed)
[facility] H&P/Consult     Patient ID: Tiffany [name] MRN: [id number] DOB/AGE: 45/22/1977 78 y.o.  Chief Complaint: right posterior acetabular fracture HPI: Tiffany [name] is an 43 y.o. female.who sustained a right acetabular fracture in a ground level fall. Initial films were considered possible artifact but no further studies done at that point. Persistent right hip pain and inability to bear weight. CT obtained two days ago showed large posterior wall fracture with marginal impaction. Denies numbness or tingling in the leg. H/o DVT/ PE, two prior fragility fractures. Son with newly recurrent grand mal seizures over the last two months.  Past Medical History:  Diagnosis Date  . Abnormal Pap smear 1997   LAST PAP 10/2010  . Allergy    SEASONAL  . Anemia    CHRONIC  . 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 Arthritis   . Blood dyscrasia    SICKLE CELL TRAIT  . Blood transfusion without reported diagnosis 2005   after vaginal delivery  . Breast mass in female 2003  . Broken foot 03/16/2016   right  . Bruises easily   . BV (bacterial vaginosis)   . Cancer ([place])   . Cyst of ovary 2003  . Depression 2003  . DUB (dysfunctional uterine bleeding) 2003  . DVT, lower extremity ([place]) 10/2014   left leg  . Fatigue 06/10/2018  . Fibroid    LONG TERM DX  . Fibromyalgia 2004  . Fibrositis   . GERD (gastroesophageal reflux disease) 2007  . H/O amenorrhea 2006  . H/O menorrhagia 2011  . H/O sickle cell trait   . Heart murmur 2007  . History of abnormal cervical Pap smear 2003   had colposcopy and cryo.  Follow up paps are normal.  . Hx: UTI (urinary tract infection) 2003  . Hypertension 2005   RESOLVED WITH WT LOSS  . IBD (inflammatory bowel disease)   . IBS (irritable bowel syndrome)   . Infection    OCC YEAST  . Low back pain 2003  . Menses, irregular 2003  . Migraine   . Nausea and vomiting 06/10/2018  . Neck pain 06/10/2018  . Night sweats  06/10/2018  . Obese 2003  . Pulmonary embolism ([place]) 10/2014  . Sacroiliitis ([place])   . Sickle-cell trait ([place])   . Suspected Covid-19 Virus Infection 07/28/2018  . Trichomonas   . Uterine leiomyoma   . Vaginitis and vulvovaginitis 2004  . Weight loss 06/10/2018  . Yeast vaginitis 04/24/2010    Past Surgical History:  Procedure Laterality Date  . AUGMENTATION MAMMAPLASTY  [id number]   Silicone implants  . broken ankle Right   . CERVICAL CONE BIOPSY  1997  . COSMETIC SURGERY  2012   thigh lift  . CYSTECTOMY Right    obtain date from patient, was not on history form dated 06/16/10--benign cyst removed Rt.ovary  . DILATION AND CURETTAGE OF UTERUS    . LAPAROSCOPIC GASTRIC BANDING  05/20/2007  . WISDOM TOOTH EXTRACTION      Family History  Problem Relation Age of Onset  . Diabetes Mother   . Arthritis Mother   . Hyperlipidemia Mother   . Other Mother        VARICOSE VEINS; DECREASED HEART RATE; +Hysterectomy for unspecified reason  . Fibromyalgia Mother   . Colon polyps Mother        unspecified number  . Depression Mother   . Dementia Mother   . Heart disease  Father   . Hypertension Father   . Alcohol abuse Father   . Drug abuse Father   . Stroke Father   . Ovarian cancer Sister        dx. <43y  . Cervical cancer Sister 11  . Multiple sclerosis Brother   . Thyroid disease Brother        hypothyroidism  . Lung cancer Maternal Grandmother 74       secondhand smoke  . Depression Maternal Grandmother   . Miscarriages / Stillbirths Maternal Grandmother   . Other Maternal Grandmother        binswanger's disease  . Birth defects Maternal Aunt        EXTRA DIGITS; MUSCULAR DISEASE  . Early death Maternal Aunt 66  . COPD Maternal Grandfather   . Stroke Maternal Grandfather   . Ovarian cancer Maternal Aunt 86       Dec.age 42 Ovarian CA  . Early death Maternal Aunt        d. three days after birth; unknown cause  . Breast cancer Maternal Aunt 49  . Colon polyps Maternal Aunt         unspecified number  . Liver disease Paternal Aunt   . Ovarian cancer Maternal Aunt 73  . Depression Maternal Aunt   . Heart attack Paternal Uncle        in mid-50s  . Alcohol abuse Maternal Uncle   . Breast cancer Cousin        maternal   Social History:  reports that she has never smoked. She has never used smokeless tobacco. She reports current alcohol use of about 1.0 standard drinks of alcohol per week. She reports that she does not use drugs.  Allergies:  Allergies  Allergen Reactions  . Flexeril [Cyclobenzaprine Hcl] Shortness Of Breath, Itching, Swelling and Rash    Swelling of throat  . Latex Shortness Of Breath, Itching and Rash  . Lamotrigine Rash  . Seasonal Ic  [Cholestatin] Other (See Comments)  . Other Other (See Comments)    RUBBER    Medications Prior to Admission  Medication Sig Dispense Refill  . ALPRAZolam (XANAX) 1 MG tablet Take 1 mg by mouth at bedtime as needed for anxiety.    [name] amphetamine-dextroamphetamine (ADDERALL) 30 MG tablet Take 30 mg by mouth 2 (two) times daily. Morning and noon    . desloratadine (CLARINEX) 5 MG tablet Take 5 mg by mouth daily.    . fluticasone (FLONASE) 50 MCG/ACT nasal spray Place 1 spray into both nostrils daily as needed for allergies.     [name] linaclotide (LINZESS) 145 MCG CAPS capsule Take 145 mcg by mouth as needed.    . lubiprostone (AMITIZA) 24 MCG capsule Take 1 capsule (24 mcg total) by mouth daily with breakfast. (Patient taking differently: Take 24 mcg by mouth daily with breakfast. As needed) 30 capsule 5  . oxyCODONE-acetaminophen (PERCOCET/ROXICET) 5-325 MG tablet Take 1 tablet by mouth 2 (two) times daily as needed for pain.    . pantoprazole (PROTONIX) 40 MG tablet Take 40 mg by mouth daily.    [name] SPRAVATO, 56 MG DOSE, 28 MG/DEVICE SOPK Place 2 sprays into the nose every 14 (fourteen) days.    [name] zolpidem (AMBIEN) 5 MG tablet Take 5 mg by mouth at bedtime.     [name] acetaminophen (TYLENOL) 500 MG tablet Take 1 tablet  (500 mg total) by mouth every 6 (six) hours as needed. (Patient not taking: Reported on 06/10/2018) 30 tablet 0  .  betamethasone valerate ointment (VALISONE) 0.1 % Apply a pea sized amount topically BID for 1-2 weeks as needed. Not for daily long term use. (Patient not taking: Reported on 04/15/2018) 15 g 0  . levofloxacin (LEVAQUIN) 500 MG tablet Take 1 tablet (500 mg total) by mouth daily. (Patient not taking: Reported on 07/02/2018) 7 tablet 0  . magnesium oxide (MAG-OX) 400 MG tablet Take 1 tablet (400 mg total) by mouth daily. (Patient not taking: Reported on 06/10/2018) 14 tablet 0  . nystatin cream (MYCOSTATIN) Apply 1 application topically 2 (two) times daily. Apply to affected area BID for up to 7 days. (Patient not taking: Reported on 12/25/2018) 30 g 0  . ondansetron (ZOFRAN) 4 MG tablet Take 1 tablet (4 mg total) by mouth every 8 (eight) hours as needed for nausea or vomiting. (Patient not taking: Reported on 12/25/2018) 10 tablet 0  . Potassium Chloride ER 20 MEQ TBCR Take 40 mEq by mouth daily. (Patient not taking: Reported on 12/25/2018) 14 tablet 0  . promethazine (PHENERGAN) 25 MG tablet Take 1 tablet (25 mg total) by mouth every 6 (six) hours as needed for nausea or vomiting. (Patient not taking: Reported on 12/25/2018) 10 tablet 0    Results for orders placed or performed during the hospital encounter of 12/25/18 (from the past 48 hour(s))  Pregnancy, urine POC     Status: None   Collection Time: 12/25/18  3:14 PM  Result Value Ref Range   Preg Test, Ur NEGATIVE NEGATIVE    Comment:        THE SENSITIVITY OF THIS METHODOLOGY IS >24 mIU/mL   CBC     Status: Abnormal   Collection Time: 12/25/18  3:22 PM  Result Value Ref Range   WBC 4.2 4.0 - 10.5 K/uL   RBC 3.58 (L) 3.87 - 5.11 MIL/uL   Hemoglobin 9.3 (L) 12.0 - 15.0 g/dL   HCT 28.2 (L) 36.0 - 46.0 %   MCV 78.8 (L) 80.0 - 100.0 fL   MCH 26.0 26.0 - 34.0 pg   MCHC 33.0 30.0 - 36.0 g/dL   RDW 19.9 (H) 11.5 - 15.5 %   Platelets 343  150 - 400 K/uL   nRBC 0.0 0.0 - 0.2 %    Comment: Performed at [facility], [place] [address] [place], [place] [address]  Comprehensive metabolic panel     Status: Abnormal   Collection Time: 12/25/18  3:22 PM  Result Value Ref Range   Sodium 135 135 - 145 mmol/L   Potassium 3.9 3.5 - 5.1 mmol/L   Chloride 105 98 - 111 mmol/L   CO2 19 (L) 22 - 32 mmol/L   Glucose, Bld 71 70 - 99 mg/dL   BUN 6 6 - 20 mg/dL   Creatinine, Ser 0.64 0.44 - 1.00 mg/dL   Calcium 8.3 (L) 8.9 - 10.3 mg/dL   Total Protein 5.8 (L) 6.5 - 8.1 g/dL   Albumin 3.1 (L) 3.5 - 5.0 g/dL   AST 16 15 - 41 U/L   ALT 10 0 - 44 U/L   Alkaline Phosphatase 58 38 - 126 U/L   Total Bilirubin 0.4 0.3 - 1.2 mg/dL   GFR calc non Af Amer >60 >60 mL/min   GFR calc Af Amer >60 >60 mL/min   Anion gap 11 5 - 15    Comment: Performed at [facility] [address], [place], [place] [address]  Protime-INR     Status: None   Collection  Time: 12/25/18  3:22 PM  Result Value Ref Range   Prothrombin Time 14.5 11.4 - 15.2 seconds   INR 1.1 0.8 - 1.2    Comment: (NOTE) INR goal varies based on device and disease states. Performed at [facility], [place] [address], [place], [place] [address]   APTT     Status: None   Collection Time: 12/25/18  3:22 PM  Result Value Ref Range   aPTT 32 24 - 36 seconds    Comment: Performed at [facility], [place] [address], [place], [place] [address]  Type and screen Order type and screen if day of surgery is less than 15 days from draw of preadmission visit or order morning of surgery if day of surgery is greater than 6 days from preadmission visit.     Status: None   Collection Time: 12/25/18  3:35 PM  Result Value Ref Range   ABO/RH(D) A POS    Antibody Screen NEG    Sample Expiration      12/28/2018,2359 Performed at [facility], [place] [address], [place], [place] [address]   ABO/Rh     Status: None (Preliminary result)   Collection Time: 12/25/18  3:35 PM  Result  Value Ref Range   ABO/RH(D) PENDING   Urinalysis, Routine w reflex microscopic     Status: Abnormal   Collection Time: 12/25/18  3:50 PM  Result Value Ref Range   Color, Urine AMBER (A) YELLOW    Comment: BIOCHEMICALS MAY BE AFFECTED BY COLOR   APPearance CLOUDY (A) CLEAR   Specific Gravity, Urine 1.018 1.005 - 1.030   pH 5.0 5.0 - 8.0   Glucose, UA NEGATIVE NEGATIVE mg/dL   Hgb urine dipstick NEGATIVE NEGATIVE   Bilirubin Urine NEGATIVE NEGATIVE   Ketones, ur NEGATIVE NEGATIVE mg/dL   Protein, ur 30 (A) NEGATIVE mg/dL   Nitrite NEGATIVE NEGATIVE   Leukocytes,Ua MODERATE (A) NEGATIVE   RBC / HPF 0-5 0 - 5 RBC/hpf   WBC, UA 6-10 0 - 5 WBC/hpf   Bacteria, UA FEW (A) NONE SEEN   Squamous Epithelial / LPF 6-10 0 - 5   Mucus PRESENT    Hyaline Casts, UA PRESENT     Comment: Performed at [facility], [address], [place], [place] [address]   Dg Chest Portable 1 View  Result Date: 12/25/2018 CLINICAL DATA:  Preoperative exam for repair of acetabular fracture. EXAM: PORTABLE CHEST 1 VIEW COMPARISON:  June 22, 2018 FINDINGS: The heart size and mediastinal contours are within normal limits. Both lungs are clear. The visualized skeletal structures are unremarkable. IMPRESSION: No active disease. Electronically Signed   By: Dorise Bullion III M.D   On: 12/25/2018 15:00    ROS  Multiple as above. No recent fever, bleeding abnormalities, urologic dysfunction, GI problems, or weight gain.  Blood pressure 137/90, pulse (!) 107, temperature 98.5 F (36.9 C), temperature source Oral, resp. rate 20, height 5\' 6"  (1.676 m), weight 90.7 kg, last menstrual period 12/01/2018, SpO2 100 %. Physical Exam  NCAT RRR CTA S/ND/NT RLE No traumatic wounds, ecchymosis, or rash  Tender and pain with movement  No knee or ankle effusion  Knee stable to varus/ valgus and anterior/posterior stress  Sens DPN, SPN, TN intact  Motor EHL, ext, flex, evers 5/5  DP 2+, PT 2+, No significant edema    Assessment/Plan Two week old right posterior wall fracture with marginal impaction H/o DVT, fragility fractures  I discussed  with the patient the risks and benefits of surgery, including the possibility of infection, nerve injury, vessel injury, wound breakdown, arthritis, symptomatic hardware, DVT/ PE, loss of motion, malunion, nonunion, and need for further surgery among others.  We also specifically discussed the  elevated risks of heterotopic bone, loss of fixation or nonunion, arthritis, and thromboembolic disease.  She acknowledged these risks and wished to proceed.  Altamese Sylvia, MD [facility], [facility] [phone]   12/25/2018, 5:25 PM  [facility] [place] [address] [phone] (F)

## 2018-12-25 NOTE — Op Note (Signed)
12/25/2018  9:51 PM  PATIENT:  Tiffany [name]  43 y.o. female  PRE-OPERATIVE DIAGNOSIS:  RIGHT POSTERIOR WALL ACETABULAR FRACTURE WITH MARGINAL IMPACTION  POST-OPERATIVE DIAGNOSIS: RIGHT POSTERIOR WALL ACETABULAR FRACTURE WITH MARGINAL IMPACTION  PROCEDURE:  Procedure(s): OPEN REDUCTION INTERNAL FIXATION (ORIF) ACETABULAR FRACTURE (Right)   SURGEON:  Surgeon(s) and Role:    Altamese Peotone, MD - Primary  PHYSICIAN ASSISTANT: Ainsley Spinner, PA-C  ANESTHESIA:   general  In: 200 mL cryst EBL:  250 mL  UOP: 400 mL  BLOOD ADMINISTERED:none  DRAINS: none   LOCAL MEDICATIONS USED:  NONE  SPECIMEN:  No Specimen  DISPOSITION OF SPECIMEN:  N/A  COUNTS:  YES  TOURNIQUET:  * No tourniquets in log *  DICTATION: .Note written in EPIC  PLAN OF CARE: Admit to inpatient   PATIENT DISPOSITION:  PACU - hemodynamically stable.   Delay start of Pharmacological VTE agent (>24hrs) due to surgical blood loss or risk of bleeding: no  BRIEF SUMMARY OF INDICATION FOR PROCEDURE: Tiffany [name] a 43 y.o. involved in ground level fall while doing yardwork despite having eight children, sustaining right hip pain. She was seen at Urgent Care and films read as artifact rather than fracture, subsequent CT demonstrated displaced posterior wall fracture with marginal impaction. Given the location and complexity of the acetabular fracture, Dr. Rip Harbour asserted this was outside his scope of practice and that it would be in the best interest of the patient to have these injuries evaluated and treated by a fellowship trained orthopaedic traumatologist. Consequently, I was consulted to provide further evaluation and management. We discussed with the patient the risks and benefits of surgical treatment including infection, avascular necrosis, arthritis, nerve injury/ foot drop, vessel injury, malunion, nonunion, instability, DVT, PE, heart attack, stroke, heterotopic ossification, need  for blood transfusion or further surgery including total hip arthroplasty. These risks were acknowledged and consent provided to proceed.  BRIEF SUMMARY OF PROCEDURE: After administration of 2g of Ancef, the patient was taken to the operating room where general anesthesia was induced. Patient was then was positioned right side up with all prominences padded appropriately and axillary roll. After thorough prep with Chlorhexidine wash and betadine scrub and paint, drapes were applied and time-out called. A standard Kocher-Langenbeck approach was made. Because of the patient's morbid obesity, the case required an assistant and was more difficult and lengthy than is typically encountered. Once we exposed the tensor, it was split in line with the skin incision and the deep Charnley retractor placed. The short rotators were identified and divided near their insertion. We evacuated the hematoma from the fracture site. In addition to the hematoma, there was some fracture callous but little muscle contusion.The retroacetabular space was cleared with Cobb and then distally along the ischium after using the short rotators to reflect the sciatic nerve. The hip was brought into abduction, extension and the knee in flexion to fully relax the sciatic nerve. We were careful to guard against applying pressure to the nerve during retraction and this was diligently watched throughout. The findings included two large sections of impacted cartilage and some mild scuffing of the femoral head itself adjacent to this lesion. A Schanz pin was placed in the proximal femur and distraction pulled by my assistant while I thoroughly inspected and irrigated to rid the joint of all the potential third body wear. After this was performed, the marginal impaction segments were elevated off and cancellous bone graft placed behind them to restore articular congruity. The  large posterior wall fragments were then brought down and  reduced. These were held provisionally with pins and then a rim plate and posterior wall buttress plate applied, securing fixation in the ischium and superiorly in the retroacetabular space. The wounds were irrigated thoroughly after final images showed appropriate reduction, hardware placement, trajectory and length.  Ainsley Spinner, PA-C assisted me throughout and assistance was absolutely necessary. Closure was performed in standard layered fashion using FiberWire for the short rotators and piriformis tendons back through bone tunnels. The tensor was closed in line with the skin using a figure-of-eight #1 Vicryl and then 0 Vicryl for multiple layers of the deep adipose and 2-0 Vicryl and nylon for the skin. Sterile gently compressive dressing was applied. The patient was then taken to the PACU in stable condition.  PROGNOSIS:  Reduction and integrity of the acetabulum has been restored. Because of the articular involvement, risk of arthritis is significantly elevated, which may eventually require a total hip arthroplasty. Because of the delay to surgery the risk of heterotopic ossification is also significantly increased. Patient will require posterior hip precautions and be touchdown weightbearing for the next 8 weeks with gradual weightbearing thereafter. DVT prophylaxis will begin with Xarelto tomorrow. We will pursue prophylactic radiation for heterotopic ossification at this time.     Astrid Divine. Marcelino Scot, M.D.

## 2018-12-25 NOTE — Brief Op Note (Signed)
12/25/2018  9:51 PM  PATIENT:  Tiffany [name]  43 y.o. female  PRE-OPERATIVE DIAGNOSIS:  RIGHT POSTERIOR WALL ACETABULAR FRACTURE WITH MARGINAL IMPACTION  POST-OPERATIVE DIAGNOSIS: RIGHT POSTERIOR WALL ACETABULAR FRACTURE WITH MARGINAL IMPACTION  PROCEDURE:  Procedure(s): OPEN REDUCTION INTERNAL FIXATION (ORIF) ACETABULAR FRACTURE (Right)   SURGEON:  Surgeon(s) and Role:    Altamese Payson, MD - Primary  PHYSICIAN ASSISTANT: Ainsley Spinner, PA-C  ANESTHESIA:   general  In: 200 mL cryst EBL:  250 mL  UOP: 400 mL  BLOOD ADMINISTERED:none  DRAINS: none   LOCAL MEDICATIONS USED:  NONE  SPECIMEN:  No Specimen  DISPOSITION OF SPECIMEN:  N/A  COUNTS:  YES  TOURNIQUET:  * No tourniquets in log *  DICTATION: .Note written in EPIC  PLAN OF CARE: Admit to inpatient   PATIENT DISPOSITION:  PACU - hemodynamically stable.   Delay start of Pharmacological VTE agent (>24hrs) due to surgical blood loss or risk of bleeding: no  BRIEF SUMMARY OF INDICATION FOR PROCEDURE: ANAIYA [name] is a 43 y.o. involved in ground level fall while doing yardwork despite having eight children, sustaining right hip pain. She was seen at Urgent Care and films read as artifact rather than fracture, subsequent CT demonstrated displaced posterior wall fracture with marginal impaction. Given the location and complexity of the acetabular fracture, Dr. Rip Harbour asserted this was outside his scope of practice and that it would be in the best interest of the patient to have these injuries evaluated and treated by a fellowship trained orthopaedic traumatologist. Consequently, I was consulted to provide further evaluation and management. We discussed with the patient the risks and benefits of surgical treatment including infection, avascular necrosis, arthritis, nerve injury/ foot drop, vessel injury, malunion, nonunion, instability, DVT, PE, heart attack, stroke, heterotopic ossification, need for blood transfusion  or further surgery including total hip arthroplasty.  These risks were acknowledged and consent provided to proceed.   BRIEF SUMMARY OF PROCEDURE:  After administration of 2g of Ancef, the patient was taken to the operating room where general anesthesia was induced. Patient was then was positioned right side up with all prominences padded appropriately and axillary roll.  After thorough prep with Chlorhexidine wash and betadine scrub and paint, drapes were applied and time-out called. A standard Kocher-Langenbeck approach was made.  Because of the patient's morbid obesity, the case required an assistant and was more difficult and lengthy than is typically encountered.  Once we exposed the tensor, it was split in line with the skin incision and the deep Charnley retractor placed.  The short rotators were identified and divided near their insertion.  We evacuated the hematoma from the fracture site.  In addition to the hematoma, there was some fracture callous but little muscle contusion.The retroacetabular space was cleared with Cobb and then distally along the ischium after using the short rotators to reflect the sciatic nerve.  The hip was brought into abduction, extension and the knee in flexion to fully relax the sciatic nerve. We were careful to guard against applying pressure to the nerve during retraction and this was diligently watched throughout.  The findings included two large sections of impacted cartilage and some mild scuffing of the femoral head itself adjacent to this lesion. A Schanz pin was placed in the proximal femur and distraction pulled by my assistant while I thoroughly inspected and irrigated to rid the joint of all the potential third body wear.  After this was performed, the marginal impaction segments were elevated  off and cancellous bone graft placed behind them to restore articular congruity.  The large posterior wall fragments were then brought down and reduced.  These were  held provisionally with pins and then a rim plate and posterior wall buttress plate applied, securing fixation in the ischium and superiorly in the retroacetabular space.  The wounds were irrigated thoroughly after final images showed appropriate reduction, hardware placement, trajectory and length.   Ainsley Spinner, PA-C assisted me throughout and assistance was absolutely necessary.  Closure was performed in standard layered fashion using FiberWire for the short rotators and piriformis tendons back through bone tunnels.  The tensor was closed in line with the skin using a figure-of-eight #1 Vicryl and then 0 Vicryl for multiple layers of the deep adipose and 2-0 Vicryl and nylon for the skin. Sterile gently compressive dressing was applied.  The patient was then taken to the PACU in stable condition.   PROGNOSIS:   Reduction and integrity of the acetabulum has been restored. Because of the articular involvement, risk of arthritis is significantly elevated, which may eventually require a total hip arthroplasty. Because of the delay to surgery the risk of heterotopic ossification is also significantly increased. Patient will require posterior hip precautions and be touchdown weightbearing for the next 8 weeks with gradual weightbearing thereafter. DVT prophylaxis will begin with Xarelto tomorrow.  We will pursue prophylactic radiation for heterotopic ossification at this time.        Astrid Divine. Marcelino Scot, M.D.

## 2018-12-26 ENCOUNTER — Inpatient Hospital Stay (HOSPITAL_COMMUNITY): Payer: Managed Care, Other (non HMO)

## 2018-12-26 ENCOUNTER — Ambulatory Visit
Admission: RE | Admit: 2018-12-26 | Discharge: 2018-12-26 | Disposition: A | Discharge status: Home / Self Care | Payer: Managed Care, Other (non HMO) | Source: Ambulatory Visit | Attending: Radiation Oncology | Admitting: Radiation Oncology

## 2018-12-26 ENCOUNTER — Ambulatory Visit: Payer: Managed Care, Other (non HMO)

## 2018-12-26 ENCOUNTER — Ambulatory Visit: Payer: Managed Care, Other (non HMO) | Attending: Radiation Oncology

## 2018-12-26 ENCOUNTER — Encounter (HOSPITAL_COMMUNITY): Payer: Self-pay | Admitting: Orthopedic Surgery

## 2018-12-26 ENCOUNTER — Encounter: Payer: Self-pay | Admitting: Radiation Oncology

## 2018-12-26 DIAGNOSIS — S32401A Unspecified fracture of right acetabulum, initial encounter for closed fracture: Secondary | ICD-10-CM | POA: Diagnosis present

## 2018-12-26 LAB — CBC
HCT: 28.8 % — ABNORMAL LOW (ref 36.0–46.0)
Hemoglobin: 9.3 g/dL — ABNORMAL LOW (ref 12.0–15.0)
MCH: 24.9 pg — ABNORMAL LOW (ref 26.0–34.0)
MCHC: 32.3 g/dL (ref 30.0–36.0)
MCV: 77.2 fL — ABNORMAL LOW (ref 80.0–100.0)
Platelets: 425 10*3/uL — ABNORMAL HIGH (ref 150–400)
RBC: 3.73 MIL/uL — ABNORMAL LOW (ref 3.87–5.11)
RDW: 19.5 % — ABNORMAL HIGH (ref 11.5–15.5)
WBC: 9.4 10*3/uL (ref 4.0–10.5)
nRBC: 0 % (ref 0.0–0.2)

## 2018-12-26 LAB — BASIC METABOLIC PANEL
Anion gap: 5 (ref 5–15)
BUN: 5 mg/dL — ABNORMAL LOW (ref 6–20)
CO2: 23 mmol/L (ref 22–32)
Calcium: 8.5 mg/dL — ABNORMAL LOW (ref 8.9–10.3)
Chloride: 108 mmol/L (ref 98–111)
Creatinine, Ser: 0.76 mg/dL (ref 0.44–1.00)
GFR calc Af Amer: >60 mL/min (ref >60)
GFR calc non Af Amer: >60 mL/min (ref >60)
Glucose, Bld: 103 mg/dL — ABNORMAL HIGH (ref 70–99)
Potassium: 4.1 mmol/L (ref 3.5–5.1)
Sodium: 136 mmol/L (ref 135–145)

## 2018-12-26 LAB — PREALBUMIN: Prealbumin: 23.3 mg/dL (ref 18–38)

## 2018-12-26 LAB — TSH: TSH: 0.758 u[IU]/mL (ref 0.350–4.500)

## 2018-12-26 MED ORDER — BISACODYL 5 MG PO TBEC: 5.0000 mg | DELAYED_RELEASE_TABLET | Freq: Every day | ORAL | Status: DC | PRN

## 2018-12-26 MED ORDER — RIVAROXABAN 10 MG PO TABS: 10.0000 mg | ORAL_TABLET | Freq: Every day | ORAL | 1 refills | Status: DC

## 2018-12-26 MED ORDER — METHOCARBAMOL 500 MG PO TABS: 500.0000 mg | ORAL_TABLET | Freq: Four times a day (QID) | ORAL | 0 refills | Status: DC | PRN

## 2018-12-26 MED ORDER — METHOCARBAMOL 1000 MG/10ML IJ SOLN: 500.0000 mg | Freq: Three times a day (TID) | INTRAVENOUS | Status: DC

## 2018-12-26 MED ORDER — ACETAMINOPHEN 325 MG PO TABS
325.0000 mg | ORAL_TABLET | Freq: Four times a day (QID) | ORAL | Status: DC | PRN
  Administered 2018-12-28: 08:00:00 325 mg via ORAL
  Filled 2018-12-26: qty 2

## 2018-12-26 MED ORDER — POTASSIUM CHLORIDE IN NACL 20-0.9 MEQ/L-% IV SOLN
INTRAVENOUS | Status: DC
  Administered 2018-12-26: 01:00:00 via INTRAVENOUS
  Filled 2018-12-26: qty 1000

## 2018-12-26 MED ORDER — MORPHINE SULFATE (PF) 2 MG/ML IV SOLN
0.5000 mg | INTRAVENOUS | Status: DC | PRN
  Administered 2018-12-26 (×4): 1 mg via INTRAVENOUS
  Filled 2018-12-26 (×5): qty 1

## 2018-12-26 MED ORDER — HYDROCODONE-ACETAMINOPHEN 7.5-325 MG PO TABS
1.0000 | ORAL_TABLET | ORAL | Status: DC | PRN
  Administered 2018-12-26 (×2): 2 via ORAL
  Administered 2018-12-26 (×2): 1 via ORAL
  Filled 2018-12-26 (×3): qty 2
  Filled 2018-12-26: qty 1
  Filled 2018-12-26 (×3): qty 2

## 2018-12-26 MED ORDER — METHOCARBAMOL 750 MG PO TABS
750.0000 mg | ORAL_TABLET | Freq: Three times a day (TID) | ORAL | Status: DC
  Administered 2018-12-26 – 2018-12-29 (×10): 750 mg via ORAL
  Filled 2018-12-26 (×9): qty 1

## 2018-12-26 MED ORDER — RIVAROXABAN 10 MG PO TABS
10.0000 mg | ORAL_TABLET | Freq: Every day | ORAL | Status: DC
  Administered 2018-12-26 – 2018-12-29 (×4): 10 mg via ORAL
  Filled 2018-12-26 (×4): qty 1

## 2018-12-26 MED ORDER — ACETAMINOPHEN 500 MG PO TABS
500.0000 mg | ORAL_TABLET | Freq: Two times a day (BID) | ORAL | Status: DC
  Administered 2018-12-26 – 2018-12-29 (×6): 500 mg via ORAL
  Filled 2018-12-26 (×7): qty 1

## 2018-12-26 MED ORDER — DOCUSATE SODIUM 100 MG PO CAPS: 100.0000 mg | ORAL_CAPSULE | Freq: Two times a day (BID) | ORAL | 0 refills | Status: DC

## 2018-12-26 MED ORDER — METOCLOPRAMIDE HCL 5 MG/ML IJ SOLN
5.0000 mg | Freq: Three times a day (TID) | INTRAMUSCULAR | Status: DC | PRN
  Administered 2018-12-26: 10 mg via INTRAVENOUS
  Filled 2018-12-26: qty 2

## 2018-12-26 MED ORDER — ONDANSETRON HCL 4 MG/2ML IJ SOLN
4.0000 mg | Freq: Four times a day (QID) | INTRAMUSCULAR | Status: DC | PRN
  Administered 2018-12-26 (×2): 4 mg via INTRAVENOUS
  Filled 2018-12-26 (×2): qty 2

## 2018-12-26 MED ORDER — CEFAZOLIN SODIUM-DEXTROSE 1-4 GM/50ML-% IV SOLN
1.0000 g | Freq: Four times a day (QID) | INTRAVENOUS | Status: AC
  Administered 2018-12-26 (×3): 1 g via INTRAVENOUS
  Filled 2018-12-26 (×3): qty 50

## 2018-12-26 MED ORDER — ACETAMINOPHEN 500 MG PO TABS: 500.0000 mg | ORAL_TABLET | Freq: Two times a day (BID) | ORAL | 0 refills | Status: DC

## 2018-12-26 MED ORDER — HYDROCODONE-ACETAMINOPHEN 5-325 MG PO TABS: 1.0000 | ORAL_TABLET | Freq: Four times a day (QID) | ORAL | 0 refills | Status: DC | PRN

## 2018-12-26 MED ORDER — METOCLOPRAMIDE HCL 10 MG PO TABS: 5.0000 mg | ORAL_TABLET | Freq: Three times a day (TID) | ORAL | Status: DC | PRN

## 2018-12-26 MED ORDER — HYDROCODONE-ACETAMINOPHEN 5-325 MG PO TABS: 1.0000 | ORAL_TABLET | ORAL | Status: DC | PRN

## 2018-12-26 MED ORDER — DIPHENHYDRAMINE HCL 12.5 MG/5ML PO ELIX
12.5000 mg | ORAL_SOLUTION | ORAL | Status: DC | PRN
  Administered 2018-12-27: 21:00:00 25 mg via ORAL
  Filled 2018-12-26: qty 10

## 2018-12-26 MED ORDER — HYDROMORPHONE HCL 1 MG/ML IJ SOLN
0.5000 mg | INTRAMUSCULAR | Status: DC | PRN
  Administered 2018-12-26 (×2): 1 mg via INTRAVENOUS
  Administered 2018-12-27 (×2): 0.5 mg via INTRAVENOUS
  Administered 2018-12-27: 1 mg via INTRAVENOUS
  Administered 2018-12-27: 0.5 mg via INTRAVENOUS
  Administered 2018-12-27 (×2): 1 mg via INTRAVENOUS
  Administered 2018-12-28: 03:00:00 0.5 mg via INTRAVENOUS
  Administered 2018-12-28: 1 mg via INTRAVENOUS
  Filled 2018-12-26 (×11): qty 1

## 2018-12-26 MED ORDER — DOCUSATE SODIUM 100 MG PO CAPS
100.0000 mg | ORAL_CAPSULE | Freq: Two times a day (BID) | ORAL | Status: DC
  Administered 2018-12-26 – 2018-12-28 (×7): 100 mg via ORAL
  Filled 2018-12-26 (×8): qty 1

## 2018-12-26 MED ORDER — ONDANSETRON HCL 4 MG PO TABS: 4.0000 mg | ORAL_TABLET | Freq: Four times a day (QID) | ORAL | Status: DC | PRN

## 2018-12-26 MED ORDER — POLYETHYLENE GLYCOL 3350 17 G PO PACK
17.0000 g | PACK | Freq: Every day | ORAL | Status: DC
  Filled 2018-12-26 (×3): qty 1

## 2018-12-26 NOTE — Progress Notes (Signed)
Received pt - pt to xray at 0724.

## 2018-12-26 NOTE — Progress Notes (Signed)
Orthopedic Trauma Service Progress Note  Patient ID: Tiffany [name] MRN: [id number] DOB/AGE: 04-16-1975 43 y.o.  Subjective:  Doing well Pain tolerable No specific complaints  XRT today for HO prophylaxis at [facility]   Review of Systems  Constitutional: Negative for chills and fever.  Respiratory: Negative for shortness of breath and wheezing.   Cardiovascular: Negative for chest pain and palpitations.  Gastrointestinal: Negative for nausea and vomiting.  Neurological: Negative for tingling and sensory change.    Objective:   VITALS:   Vitals:   12/26/18 0501 12/26/18 0838 12/26/18 0931 12/26/18 1146  BP: 111/77 115/77  128/85  Pulse: 95 88 99 99  Resp: 18   18  Temp: 97.8 F (36.6 C) 98.5 F (36.9 C)  98.3 F (36.8 C)  TempSrc: Oral Oral  Oral  SpO2:  98% 98% 96%  Weight:      Height:        Estimated body mass index is 32.28 kg/m as calculated from the following:   Height as of this encounter: 5\' 6"  (1.676 m).   Weight as of this encounter: 90.7 kg.   Intake/Output      09/17 0701 - 09/18 0700 09/18 0701 - 09/19 0700   I.V. (mL/kg) 2194.9 (24.2)    IV Piggyback 50    Total Intake(mL/kg) 2244.9 (24.8)    Urine (mL/kg/hr) 2100    Blood 250    Total Output 2350    Net -105.2           LABS  Results for orders placed or performed during the hospital encounter of 12/25/18 (from the past 24 hour(s))  Pregnancy, urine POC     Status: None   Collection Time: 12/25/18  3:14 PM  Result Value Ref Range   Preg Test, Ur NEGATIVE NEGATIVE  CBC     Status: Abnormal   Collection Time: 12/25/18  3:22 PM  Result Value Ref Range   WBC 4.2 4.0 - 10.5 K/uL   RBC 3.58 (L) 3.87 - 5.11 MIL/uL   Hemoglobin 9.3 (L) 12.0 - 15.0 g/dL   HCT 28.2 (L) 36.0 - 46.0 %   MCV 78.8 (L) 80.0 - 100.0 fL   MCH 26.0 26.0 - 34.0 pg   MCHC 33.0 30.0 - 36.0 g/dL   RDW 19.9 (H) 11.5 - 15.5 %   Platelets 343 150 -  400 K/uL   nRBC 0.0 0.0 - 0.2 %  Comprehensive metabolic panel     Status: Abnormal   Collection Time: 12/25/18  3:22 PM  Result Value Ref Range   Sodium 135 135 - 145 mmol/L   Potassium 3.9 3.5 - 5.1 mmol/L   Chloride 105 98 - 111 mmol/L   CO2 19 (L) 22 - 32 mmol/L   Glucose, Bld 71 70 - 99 mg/dL   BUN 6 6 - 20 mg/dL   Creatinine, Ser 0.64 0.44 - 1.00 mg/dL   Calcium 8.3 (L) 8.9 - 10.3 mg/dL   Total Protein 5.8 (L) 6.5 - 8.1 g/dL   Albumin 3.1 (L) 3.5 - 5.0 g/dL   AST 16 15 - 41 U/L   ALT 10 0 - 44 U/L   Alkaline Phosphatase 58 38 - 126 U/L   Total Bilirubin 0.4 0.3 - 1.2 mg/dL   GFR calc non Af Amer >60 >  60 mL/min   GFR calc Af Amer >60 >60 mL/min   Anion gap 11 5 - 15  Protime-INR     Status: None   Collection Time: 12/25/18  3:22 PM  Result Value Ref Range   Prothrombin Time 14.5 11.4 - 15.2 seconds   INR 1.1 0.8 - 1.2  APTT     Status: None   Collection Time: 12/25/18  3:22 PM  Result Value Ref Range   aPTT 32 24 - 36 seconds  Type and screen Order type and screen if day of surgery is less than 15 days from draw of preadmission visit or order morning of surgery if day of surgery is greater than 6 days from preadmission visit.     Status: None   Collection Time: 12/25/18  3:35 PM  Result Value Ref Range   ABO/RH(D) A POS    Antibody Screen NEG    Sample Expiration      12/28/2018,2359 Performed at [facility], [place] [address], [place], [place] [address]   ABO/Rh     Status: None   Collection Time: 12/25/18  3:35 PM  Result Value Ref Range   ABO/RH(D)      A POS Performed at [place] [address] [place], [place] [address]   Urinalysis, Routine w reflex microscopic     Status: Abnormal   Collection Time: 12/25/18  3:50 PM  Result Value Ref Range   Color, Urine AMBER (A) YELLOW   APPearance CLOUDY (A) CLEAR   Specific Gravity, Urine 1.018 1.005 - 1.030   pH 5.0 5.0 - 8.0   Glucose, UA NEGATIVE NEGATIVE mg/dL   Hgb urine dipstick NEGATIVE  NEGATIVE   Bilirubin Urine NEGATIVE NEGATIVE   Ketones, ur NEGATIVE NEGATIVE mg/dL   Protein, ur 30 (A) NEGATIVE mg/dL   Nitrite NEGATIVE NEGATIVE   Leukocytes,Ua MODERATE (A) NEGATIVE   RBC / HPF 0-5 0 - 5 RBC/hpf   WBC, UA 6-10 0 - 5 WBC/hpf   Bacteria, UA FEW (A) NONE SEEN   Squamous Epithelial / LPF 6-10 0 - 5   Mucus PRESENT    Hyaline Casts, UA PRESENT   I-STAT 4, (NA,K, GLUC, HGB,HCT)     Status: Abnormal   Collection Time: 12/25/18  8:08 PM  Result Value Ref Range   Sodium 138 135 - 145 mmol/L   Potassium 3.6 3.5 - 5.1 mmol/L   Glucose, Bld 103 (H) 70 - 99 mg/dL   HCT 27.0 (L) 36.0 - 46.0 %   Hemoglobin 9.2 (L) 12.0 - 15.0 g/dL  I-STAT 4, (NA,K, GLUC, HGB,HCT)     Status: Abnormal   Collection Time: 12/25/18  9:30 PM  Result Value Ref Range   Sodium 137 135 - 145 mmol/L   Potassium 3.4 (L) 3.5 - 5.1 mmol/L   Glucose, Bld 117 (H) 70 - 99 mg/dL   HCT 29.0 (L) 36.0 - 46.0 %   Hemoglobin 9.9 (L) 12.0 - 15.0 g/dL  TSH     Status: None   Collection Time: 12/26/18  8:35 AM  Result Value Ref Range   TSH 0.758 0.350 - 4.500 uIU/mL  Prealbumin     Status: None   Collection Time: 12/26/18  8:35 AM  Result Value Ref Range   Prealbumin 23.3 18 - 38 mg/dL  Basic metabolic panel     Status: Abnormal   Collection Time: 12/26/18  8:35 AM  Result Value Ref Range   Sodium 136 135 -  145 mmol/L   Potassium 4.1 3.5 - 5.1 mmol/L   Chloride 108 98 - 111 mmol/L   CO2 23 22 - 32 mmol/L   Glucose, Bld 103 (H) 70 - 99 mg/dL   BUN 5 (L) 6 - 20 mg/dL   Creatinine, Ser 0.76 0.44 - 1.00 mg/dL   Calcium 8.5 (L) 8.9 - 10.3 mg/dL   GFR calc non Af Amer >60 >60 mL/min   GFR calc Af Amer >60 >60 mL/min   Anion gap 5 5 - 15  CBC     Status: Abnormal   Collection Time: 12/26/18  8:35 AM  Result Value Ref Range   WBC 9.4 4.0 - 10.5 K/uL   RBC 3.73 (L) 3.87 - 5.11 MIL/uL   Hemoglobin 9.3 (L) 12.0 - 15.0 g/dL   HCT 28.8 (L) 36.0 - 46.0 %   MCV 77.2 (L) 80.0 - 100.0 fL   MCH 24.9 (L) 26.0 -  34.0 pg   MCHC 32.3 30.0 - 36.0 g/dL   RDW 19.5 (H) 11.5 - 15.5 %   Platelets 425 (H) 150 - 400 K/uL   nRBC 0.0 0.0 - 0.2 %     PHYSICAL EXAM:   Gen: awake, alert, sitting up in bed, NAD, appears well Lungs: unlabored Cardiac: regular  Ext:       Right Lower Extremity   Dressing stable  Distal motor and sensory function are intact  Extremity is warm  Swelling is stable   Assessment/Plan: 1 Day Post-Op   Principal Problem:   Acetabulum fracture, right (HCC) Active Problems:   Depression   Fibromyalgia   History of DVT of lower extremity   Sickle cell trait (HCC)   Vitamin D deficiency   Anti-infectives (From admission, onward)   Start     Dose/Rate Route Frequency Ordered Stop   12/26/18 0600  ceFAZolin (ANCEF) IVPB 2g/100 mL premix     2 g 200 mL/hr over 30 Minutes Intravenous On call to O.R. 12/25/18 1443 12/26/18 0630   12/26/18 0100  ceFAZolin (ANCEF) IVPB 1 g/50 mL premix     1 g 100 mL/hr over 30 Minutes Intravenous Every 6 hours 12/26/18 0024 12/26/18 1859    .  POD/HD#: 54  43 year old female with multiple chronic medical problems s/p fall 2 weeks ago with right acetabulum fracture  -Comminuted right posterior wall acetabular fracture s/p ORIF 12/25/2018  Touchdown weightbearing x8 weeks  Posterior precautions x12 weeks  PT and OT  Dressing changes as needed starting on 12/27/2018  Continue with ice for swelling and pain control  TED hose right leg   XRT today for heterotopic ossification prophylaxis it was a long hospital  - Pain management:  Continue with current regimen  Wean IV medications, encourage oral  - ABL anemia/Hemodynamics  Stable  - Medical issues   Chronic medical issues are stable  - DVT/PE prophylaxis:  Given history of unprovoked DVT we will place the patient on Xarelto 10 mg daily for 8 weeks  - ID:   periop abx completed  - Metabolic Bone Disease:  Metabolic bone labs are processing  Start vitamin D and vitamin C  supplementation   4000-5000 IUs vitamin D3 daily   1000 mg vitamin C daily - Activity:  Touchdown weightbearing right leg, posterior hip precautions.  Up with assistance using walker or crutches  - FEN/GI prophylaxis/Foley/Lines:  Regular diet  Continue with IV fluids  Discontinue Foley  - Impediments to fracture healing:  Low-energy fracture  Vitamin D  deficiency  - Dispo:  Therapy evaluations  DME  XRT [facility] at [facility] for heterotopic ossification prophylaxis  Anticipate ready for home on Sunday  Follow-up with orthopedic trauma service in 10 to 14 days    Jari Pigg, PA-C [phone] (C) 12/26/2018, 11:58 AM  [facility] [place] [address] [phone] Domingo Sep (F)

## 2018-12-26 NOTE — Progress Notes (Signed)
[facility] set up - plan to pick up between 1245 and 1300.  Pt nauseated while working with PT - given prn Zofran per MAR. Dry heaves, no emesis. Posterior hip precautions maintained.

## 2018-12-26 NOTE — Evaluation (Addendum)
Physical Therapy Evaluation Patient Details Name: Tiffany [name] MRN: [id number] DOB: 1976/03/04 Today's Date: 12/26/2018   History of Present Illness  (P) Patient is a 43 y/o female who presents with right posterior acetabular fracture s/p ORIF. Plan for postoperative radiation treatment for the prevention of heterotopic ossification. PMH includes but not limited to sickle cell trait, PE, HTN, DVT, depression, fibyomyalgia,anxiety.  Clinical Impression  Patient presents with pain, nausea, decreased sensation RLE, new TDWB status RLE and impaired mobility s/p above. Pt independent PTA, lives with 8 children and mother who can assist at d/c. Today, pt requires Min A for standing transfer and Min guard assist for SPT with use of RW. Pt able to maintain TDWB during mobility. Limited due to nausea, dizziness and dry heaves. RN present to provide Zofran. Discussed bumping up stairs using butt scoot method as pt motivated to get upstairs to bedroom. Education re: posterior hip precautions, use of gait belt to assist with RLE for bed mobility and importance of mobility. Will follow acutely to maximize independence and mobility prior to return home.     Follow Up Recommendations Home health PT;Supervision for mobility/OOB;Supervision/Assistance - 24 hour    Equipment Recommendations  Rolling walker with 5" wheels;3in1 (PT)    Recommendations for Other Services       Precautions / Restrictions Precautions Precautions: (P) Fall;Posterior Hip Precaution Comments: (P) Reviewed posterior hip precautions Restrictions Weight Bearing Restrictions: (P) Yes RLE Weight Bearing: (P) Touchdown weight bearing      Mobility  Bed Mobility Overal bed mobility: (P) Needs Assistance Bed Mobility: (P) Supine to Sit     Supine to sit: (P) Min guard;HOB elevated     General bed mobility comments: (P) Increased time and effort to get to EOB; use of LLE to assist moving RLE to EOB as well as gait belt used as  leg lifter to assist as well. + dizziness. BP stable.  Transfers Overall transfer level: (P) Needs assistance Equipment used: (P) Rolling walker (2 wheeled) Transfers: (P) Sit to/from Stand;Stand Pivot Transfers Sit to Stand: (P) Min assist;From elevated surface Stand pivot transfers: (P) Min guard       General transfer comment: (P) Assist to power to standing with cues to adhere to TDWB RLE. Cues for hand placement. Difficulty standing upright despite cues. Able to SPT to chair towards left maintaining WB. + nausea and dry heaves. RN notified.  Ambulation/Gait             General Gait Details: Deferred  Stairs            Wheelchair Mobility    Modified Rankin (Stroke Patients Only)       Balance Overall balance assessment: (P) Needs assistance Sitting-balance support: (P) Feet supported;No upper extremity supported Sitting balance-Leahy Scale: (P) Fair     Standing balance support: (P) During functional activity Standing balance-Leahy Scale: (P) Poor Standing balance comment: (P) Reliant on BUEs for support in standng esp due to TDWB RLE.                             Pertinent Vitals/Pain Pain Assessment: 0-10 Pain Score: 9  Pain Location: (P) right hip Pain Descriptors / Indicators: (P) Guarding;Sore Pain Intervention(s): Repositioned;Monitored during session;Premedicated before session;Limited activity within patient's tolerance    Home Living Family/patient expects to be discharged to:: (P) Private residence Living Arrangements: (P) Children;Parent(50 years-98 years old) Available Help at Discharge: (P) Family;Available 24 hours/day Type  of Home: (P) House Home Access: (P) Stairs to enter   Entrance Stairs-Number of Steps: (P) 1 Home Layout: (P) Two level Home Equipment: (P) None      Prior Function Level of Independence: (P) Independent         Comments: (P) Does her own Adls, drives.     Hand Dominance         Extremity/Trunk Assessment   Upper Extremity Assessment Upper Extremity Assessment: Defer to OT evaluation    Lower Extremity Assessment Lower Extremity Assessment: RLE deficits/detail RLE Deficits / Details: (P) Ankle AROM WFL; limited movement secondary to post op and pain. RLE: (P) Unable to fully assess due to pain RLE Sensation: (P) decreased light touch(knee distal to foot)    Cervical / Trunk Assessment Cervical / Trunk Assessment: (P) Normal  Communication   Communication: (P) No difficulties  Cognition Arousal/Alertness: (P) Awake/alert Behavior During Therapy: (P) WFL for tasks assessed/performed Overall Cognitive Status: (P) Within Functional Limits for tasks assessed                                        General Comments General comments (skin integrity, edema, etc.): Pink foam placed on skin tear on left upper chest. RN aware.    Exercises     Assessment/Plan    PT Assessment Patient needs continued PT services  PT Problem List Decreased mobility;Decreased knowledge of precautions;Decreased range of motion;Decreased activity tolerance;Decreased skin integrity;Pain;Impaired sensation;Decreased balance;Decreased knowledge of use of DME       PT Treatment Interventions Therapeutic activities;Gait training;Therapeutic exercise;Patient/family education;Balance training;Stair training;Neuromuscular re-education;Functional mobility training;DME instruction    PT Goals (Current goals can be found in the Care Plan section)  Acute Rehab PT Goals Patient Stated Goal: to be able to get up my stairs to get to my bedroom PT Goal Formulation: With patient Time For Goal Achievement: 01/09/19 Potential to Achieve Goals: Good    Frequency Min 4X/week   Barriers to discharge Inaccessible home environment stairs to get to bedroom    Co-evaluation PT/OT/SLP Co-Evaluation/Treatment: Yes Reason for Co-Treatment: To address functional/ADL transfers PT  goals addressed during session: Mobility/safety with mobility;Balance;Proper use of DME         AM-PAC PT "6 Clicks" Mobility  Outcome Measure Help needed turning from your back to your side while in a flat bed without using bedrails?: A Little Help needed moving from lying on your back to sitting on the side of a flat bed without using bedrails?: A Little Help needed moving to and from a bed to a chair (including a wheelchair)?: A Little Help needed standing up from a chair using your arms (e.g., wheelchair or bedside chair)?: A Little Help needed to walk in hospital room?: A Little Help needed climbing 3-5 steps with a railing? : A Lot 6 Click Score: 17    End of Session Equipment Utilized During Treatment: Gait belt Activity Tolerance: Other (comment);Patient limited by pain;Patient tolerated treatment well(nausea, dry heaves) Patient left: in chair;with call bell/phone within reach;with nursing/sitter in room Nurse Communication: Mobility status;Other (comment)(need for Zofran) PT Visit Diagnosis: Pain;Difficulty in walking, not elsewhere classified (R26.2) Pain - Right/Left: Right Pain - part of body: Hip    Time: [id number] PT Time Calculation (min) (ACUTE ONLY): 41 min   Charges:   PT Evaluation $PT Eval Moderate Complexity: 1 Mod PT Treatments $Therapeutic Activity: 8-22 mins  Wray Kearns, PT, DPT Acute Rehabilitation Services Pager [phone] Office [phone]      Rosa Sanchez 12/26/2018, 12:09 PM

## 2018-12-26 NOTE — Progress Notes (Signed)
ANTICOAGULATION CONSULT NOTE - Initial Consult  Pharmacy Consult for Xarelto Indication: VTE prophylaxis (s/p R ORIF)  Allergies  Allergen Reactions  . Flexeril [Cyclobenzaprine Hcl] Shortness Of Breath, Itching, Swelling and Rash    Swelling of throat  . Latex Shortness Of Breath, Itching and Rash  . Lamotrigine Rash  . Seasonal Ic  [Cholestatin] Other (See Comments)  . Other Other (See Comments)    RUBBER    Patient Measurements: Height: 5\' 6"  (167.6 cm) Weight: 200 lb (90.7 kg) IBW/kg (Calculated) : 59.3  Vital Signs: Temp: 98.2 F (36.8 C) (09/18 0000) Temp Source: Oral (09/17 1435) BP: 119/81 (09/18 0000) Pulse Rate: 79 (09/18 0000)  Labs: Recent Labs    12/25/18 1522 12/25/18 2008 12/25/18 2130  HGB 9.3* 9.2* 9.9*  HCT 28.2* 27.0* 29.0*  PLT 343  --   --   APTT 32  --   --   LABPROT 14.5  --   --   INR 1.1  --   --   CREATININE 0.64  --   --     Estimated Creatinine Clearance: 100.8 mL/min (by C-G formula based on SCr of 0.64 mg/dL).   Medical History: Past Medical History:  Diagnosis Date  . Abnormal Pap smear 1997   LAST PAP 10/2010  . Allergy    SEASONAL  . Anemia    CHRONIC  . 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 Arthritis   . Blood dyscrasia    SICKLE CELL TRAIT  . Blood transfusion without reported diagnosis 2005   after vaginal delivery  . Breast mass in female 2003  . Broken foot 03/16/2016   right  . Bruises easily   . BV (bacterial vaginosis)   . Cancer ([place])   . Cyst of ovary 2003  . Depression 2003  . DUB (dysfunctional uterine bleeding) 2003  . DVT, lower extremity ([place]) 10/2014   left leg  . Fatigue 06/10/2018  . Fibroid    LONG TERM DX  . Fibromyalgia 2004  . Fibrositis   . GERD (gastroesophageal reflux disease) 2007  . H/O amenorrhea 2006  . H/O menorrhagia 2011  . H/O sickle cell trait   . Heart murmur 2007  . History of abnormal cervical Pap smear 2003   had colposcopy and cryo.  Follow up paps are normal.  . Hx:  UTI (urinary tract infection) 2003  . Hypertension 2005   RESOLVED WITH WT LOSS  . IBD (inflammatory bowel disease)   . IBS (irritable bowel syndrome)   . Infection    OCC YEAST  . Low back pain 2003  . Menses, irregular 2003  . Migraine   . Nausea and vomiting 06/10/2018  . Neck pain 06/10/2018  . Night sweats 06/10/2018  . Obese 2003  . Pulmonary embolism ([place]) 10/2014  . Sacroiliitis ([place])   . Sickle-cell trait ([place])   . Suspected Covid-19 Virus Infection 07/28/2018  . Trichomonas   . Uterine leiomyoma   . Vaginitis and vulvovaginitis 2004  . Weight loss 06/10/2018  . Yeast vaginitis 04/24/2010    Medications:  Medications Prior to Admission  Medication Sig Dispense Refill Last Dose  . ALPRAZolam (XANAX) 1 MG tablet Take 1 mg by mouth at bedtime as needed for anxiety.   12/24/2018 at Unknown time  . amphetamine-dextroamphetamine (ADDERALL) 30 MG tablet Take 30 mg by mouth 2 (two) times daily. Morning and noon   12/24/2018 at Unknown time  . desloratadine (CLARINEX) 5 MG tablet Take  5 mg by mouth daily.   12/24/2018 at Unknown time  . fluticasone (FLONASE) 50 MCG/ACT nasal spray Place 1 spray into both nostrils daily as needed for allergies.    12/24/2018 at Unknown time  . linaclotide (LINZESS) 145 MCG CAPS capsule Take 145 mcg by mouth as needed.   unknown  . lubiprostone (AMITIZA) 24 MCG capsule Take 1 capsule (24 mcg total) by mouth daily with breakfast. (Patient taking differently: Take 24 mcg by mouth daily with breakfast. As needed) 30 capsule 5 unknown  . oxyCODONE-acetaminophen (PERCOCET/ROXICET) 5-325 MG tablet Take 1 tablet by mouth 2 (two) times daily as needed for pain.   12/23/2018  . pantoprazole (PROTONIX) 40 MG tablet Take 40 mg by mouth daily.   Past Month at Unknown time  . SPRAVATO, 56 MG DOSE, 28 MG/DEVICE SOPK Place 2 sprays into the nose every 14 (fourteen) days.   Past Month at Unknown time  . zolpidem (AMBIEN) 5 MG tablet Take 5 mg by mouth at bedtime.    12/24/2018 at  Unknown time  . acetaminophen (TYLENOL) 500 MG tablet Take 1 tablet (500 mg total) by mouth every 6 (six) hours as needed. (Patient not taking: Reported on 06/10/2018) 30 tablet 0 Not Taking at Unknown time  . betamethasone valerate ointment (VALISONE) 0.1 % Apply a pea sized amount topically BID for 1-2 weeks as needed. Not for daily long term use. (Patient not taking: Reported on 04/15/2018) 15 g 0 Not Taking at Unknown time  . levofloxacin (LEVAQUIN) 500 MG tablet Take 1 tablet (500 mg total) by mouth daily. (Patient not taking: Reported on 07/02/2018) 7 tablet 0 Not Taking at Unknown time  . magnesium oxide (MAG-OX) 400 MG tablet Take 1 tablet (400 mg total) by mouth daily. (Patient not taking: Reported on 06/10/2018) 14 tablet 0 Not Taking at Unknown time  . nystatin cream (MYCOSTATIN) Apply 1 application topically 2 (two) times daily. Apply to affected area BID for up to 7 days. (Patient not taking: Reported on 12/25/2018) 30 g 0 Not Taking at Unknown time  . ondansetron (ZOFRAN) 4 MG tablet Take 1 tablet (4 mg total) by mouth every 8 (eight) hours as needed for nausea or vomiting. (Patient not taking: Reported on 12/25/2018) 10 tablet 0 Not Taking at Unknown time  . Potassium Chloride ER 20 MEQ TBCR Take 40 mEq by mouth daily. (Patient not taking: Reported on 12/25/2018) 14 tablet 0 Not Taking at Unknown time  . promethazine (PHENERGAN) 25 MG tablet Take 1 tablet (25 mg total) by mouth every 6 (six) hours as needed for nausea or vomiting. (Patient not taking: Reported on 12/25/2018) 10 tablet 0 Not Taking at Unknown time    Assessment: 43 y.o. F presents s/p fall. Pt with R posterior acetabular fracture - s/p ORIF 9/17 p.m. Noted pt with h/o DVT/PE 2016 but not on AC PTA. To begin Xarelto per pharmacy for VTE prophylaxis. Hgb 9.9 (low but stable), plt wnl.  Goal of Therapy:  VTE prevention Monitor platelets by anticoagulation protocol: Yes   Plan:  Xarelto 10mg  daily Will educate pt when  able  Sherlon Handing, PharmD, BCPS 12/26/2018,12:38 AM

## 2018-12-26 NOTE — Discharge Instructions (Addendum)
[facility] Discharge Instructions   General Discharge Instructions  WEIGHT BEARING STATUS: Touchdown weightbearing Right Leg with walker or crutches  RANGE OF MOTION/ACTIVITY: posterior hip precautions Right hip. Activity as tolerated while maintaining weightbearing restrictions   Bone health:  Will discuss vitamin d labs at your follow up appointment   Wound Care: daily dressing changes starting on 12/28/2018. Please see instructions below  Discharge Wound Care Instructions  Do NOT apply any ointments, solutions or lotions to pin sites or surgical wounds.  These prevent needed drainage and even though solutions like hydrogen peroxide kill bacteria, they also damage cells lining the pin sites that help fight infection.  Applying lotions or ointments can keep the wounds moist and can cause them to breakdown and open up as well. This can increase the risk for infection. When in doubt call the office.  Surgical incisions should be dressed daily.  If any drainage is noted, use one layer of adaptic, then gauze, Kerlix, and an ace wrap.  Once the incision is completely dry and without drainage, it may be left open to air out.  Showering may begin 36-48 hours later.  Cleaning gently with soap and water.  Traumatic wounds should be dressed daily as well.    One layer of adaptic, gauze, Kerlix, then ace wrap.  The adaptic can be discontinued once the draining has ceased    If you have a wet to dry dressing: wet the gauze with saline the squeeze as much saline out so the gauze is moist (not soaking wet), place moistened gauze over wound, then place a dry gauze over the moist one, followed by Kerlix wrap, then ace wrap.  DVT/PE prophylaxis: Xarelto daily x 2 months   Diet: as you were eating previously.  Can use over the counter stool softeners and bowel preparations, such as Miralax, to help with bowel movements.  Narcotics can be constipating.  Be sure to drink plenty of  fluids  PAIN MEDICATION USE AND EXPECTATIONS  You have likely been given narcotic medications to help control your pain.  After a traumatic event that results in an fracture (broken bone) with or without surgery, it is ok to use narcotic pain medications to help control one's pain.  We understand that everyone responds to pain differently and each individual patient will be evaluated on a regular basis for the continued need for narcotic medications. Ideally, narcotic medication use should last no more than 6-8 weeks (coinciding with fracture healing).   As a patient it is your responsibility as well to monitor narcotic medication use and report the amount and frequency you use these medications when you come to your office visit.   We would also advise that if you are using narcotic medications, you should take a dose prior to therapy to maximize you participation.  IF YOU ARE ON NARCOTIC MEDICATIONS IT IS NOT PERMISSIBLE TO OPERATE A MOTOR VEHICLE (MOTORCYCLE/CAR/TRUCK/MOPED) OR HEAVY MACHINERY DO NOT MIX NARCOTICS WITH OTHER CNS (CENTRAL NERVOUS SYSTEM) DEPRESSANTS SUCH AS ALCOHOL   STOP SMOKING OR USING NICOTINE PRODUCTS!!!!  As discussed nicotine severely impairs your body's ability to heal surgical and traumatic wounds but also impairs bone healing.  Wounds and bone heal by forming microscopic blood vessels (angiogenesis) and nicotine is a vasoconstrictor (essentially, shrinks blood vessels).  Therefore, if vasoconstriction occurs to these microscopic blood vessels they essentially disappear and are unable to deliver necessary nutrients to the healing tissue.  This is one modifiable factor that you can  do to dramatically increase your chances of healing your injury.    (This means no smoking, no nicotine gum, patches, etc)  DO NOT USE NONSTEROIDAL ANTI-INFLAMMATORY DRUGS (NSAID'S)  Using products such as Advil (ibuprofen), Aleve (naproxen), Motrin (ibuprofen) for additional pain control during  fracture healing can delay and/or prevent the healing response.  If you would like to take over the counter (OTC) medication, Tylenol (acetaminophen) is ok.  However, some narcotic medications that are given for pain control contain acetaminophen as well. Therefore, you should not exceed more than 4000 mg of tylenol in a day if you do not have liver disease.  Also note that there are may OTC medicines, such as cold medicines and allergy medicines that my contain tylenol as well.  If you have any questions about medications and/or interactions please ask your doctor/PA or your pharmacist.      ICE AND ELEVATE INJURED/OPERATIVE EXTREMITY  Using ice and elevating the injured extremity above your heart can help with swelling and pain control.  Icing in a pulsatile fashion, such as 20 minutes on and 20 minutes off, can be followed.    Do not place ice directly on skin. Make sure there is a barrier between to skin and the ice pack.    Using frozen items such as frozen peas works well as the conform nicely to the are that needs to be iced.  USE AN ACE WRAP OR TED HOSE FOR SWELLING CONTROL  In addition to icing and elevation, Ace wraps or TED hose are used to help limit and resolve swelling.  It is recommended to use Ace wraps or TED hose until you are informed to stop.    When using Ace Wraps start the wrapping distally (farthest away from the body) and wrap proximally (closer to the body)   Example: If you had surgery on your leg or thing and you do not have a splint on, start the ace wrap at the toes and work your way up to the thigh        If you had surgery on your upper extremity and do not have a splint on, start the ace wrap at your fingers and work your way up to the upper arm  IF YOU ARE IN A SPLINT OR CAST DO NOT [place]   If your splint gets wet for any reason please contact the office immediately. You may shower in your splint or cast as long as you keep it dry.  This can be done  by wrapping in a cast cover or garbage back (or similar)  Do Not stick any thing down your splint or cast such as pencils, money, or hangers to try and scratch yourself with.  If you feel itchy take benadryl as prescribed on the bottle for itching  IF YOU ARE IN A CAM BOOT (BLACK BOOT)  You may remove boot periodically. Perform daily dressing changes as noted below.  Wash the liner of the boot regularly and wear a sock when wearing the boot. It is recommended that you sleep in the boot until told otherwise    Call office for the following:  Temperature greater than 101F  Persistent nausea and vomiting  Severe uncontrolled pain  Redness, tenderness, or signs of infection (pain, swelling, redness, odor or green/yellow discharge around the site)  Difficulty breathing, headache or visual disturbances  Hives  Persistent dizziness or light-headedness  Extreme fatigue  Any other questions or concerns you may have  after discharge  In an emergency, call 911 or go to an Emergency Department at a nearby hospital    [place]: [phone]   VISIT OUR WEBSITE FOR ADDITIONAL INFORMATION: [email or web address]  Information on my medicine - XARELTO (Rivaroxaban)   Why was Xarelto prescribed for you? Xarelto was prescribed for you to reduce the risk of blood clots forming after orthopedic surgery. The medical term for these abnormal blood clots is venous thromboembolism (VTE).  What do you need to know about xarelto ? Take your Xarelto ONCE DAILY at the same time every day. You may take it either with or without food.  If you have difficulty swallowing the tablet whole, you may crush it and mix in applesauce just prior to taking your dose.  Take Xarelto exactly as prescribed by your doctor and DO NOT stop taking Xarelto without talking to the doctor who prescribed the medication.  Stopping without other VTE prevention medication to take the  place of Xarelto may increase your risk of developing a clot.  After discharge, you should have regular check-up appointments with your healthcare provider that is prescribing your Xarelto.    What do you do if you miss a dose? If you miss a dose, take it as soon as you remember on the same day then continue your regularly scheduled once daily regimen the next day. Do not take two doses of Xarelto on the same day.   Important Safety Information A possible side effect of Xarelto is bleeding. You should call your healthcare provider right away if you experience any of the following: ? Bleeding from an injury or your nose that does not stop. ? Unusual colored urine (red or dark brown) or unusual colored stools (red or black). ? Unusual bruising for unknown reasons. ? A serious fall or if you hit your head (even if there is no bleeding).  Some medicines may interact with Xarelto and might increase your risk of bleeding while on Xarelto. To help avoid this, consult your healthcare provider or pharmacist prior to using any new prescription or non-prescription medications, including herbals, vitamins, non-steroidal anti-inflammatory drugs (NSAIDs) and supplements.  This website has more information on Xarelto: [email or web address]

## 2018-12-26 NOTE — Progress Notes (Signed)
Spoke with Chemical engineer at [facility] to let her know the patient has a radiation treatment today to her left hip at 2 pm.  I let her know that carelink needs to be arranged to have the patient here at 1:30 pm and to ensure she has pain medication on board.    Carelink number provided.  She verbalized understanding.  Will continue to follow as necessary.  Gloriajean Dell. Leonie Green, BSN

## 2018-12-26 NOTE — Evaluation (Signed)
Occupational Therapy Evaluation Patient Details Name: Tiffany [name] MRN: [id number] DOB: 20-Jun-1975 Today's Date: 12/26/2018    History of Present Illness Patient is a 43 y/o female who presents with right posterior acetabular fracture s/p ORIF. Plan for postoperative radiation treatment for the prevention of heterotopic ossification. PMH includes but not limited to sickle cell trait, PE, HTN, DVT, depression, fibyomyalgia,anxiety.   Clinical Impression   Patient is s/p ORIF R surgery resulting in functional limitations due to the deficits listed below (see OT problem list). pt currently with L LE TDWB and posterior hip precautions requiring max (A). Pt could benefit from further education on AE next session.  Patient will benefit from skilled OT acutely to increase independence and safety with ADLS to allow discharge [place].     Follow Up Recommendations  Home health OT    Equipment Recommendations  3 in 1 bedside commode;Other (comment)(RW)    Recommendations for Other Services       Precautions / Restrictions Precautions Precautions: Fall;Posterior Hip Precaution Comments: Reviewed posterior hip precautions Restrictions Weight Bearing Restrictions: Yes RLE Weight Bearing: Touchdown weight bearing      Mobility Bed Mobility Overal bed mobility: Needs Assistance Bed Mobility: Supine to Sit     Supine to sit: Min guard;HOB elevated     General bed mobility comments: Increased time and effort to get to EOB; use of LLE to assist moving RLE to EOB as well as gait belt used as leg lifter to assist as well. + dizziness. BP stable.  Transfers Overall transfer level: Needs assistance Equipment used: Rolling walker (2 wheeled) Transfers: Sit to/from [facility] Sit to Stand: Min assist;From elevated surface Stand pivot transfers: Min guard       General transfer comment: Assist to power to standing with cues to adhere to TDWB RLE. Cues for hand placement.  Difficulty standing upright despite cues. Able to SPT to chair towards left maintaining WB. + nausea and dry heaves. RN notified.    Balance Overall balance assessment: Needs assistance Sitting-balance support: Feet supported;No upper extremity supported Sitting balance-Leahy Scale: Fair     Standing balance support: During functional activity Standing balance-Leahy Scale: Poor Standing balance comment: Reliant on BUEs for support in standng esp due to TDWB RLE.                           ADL either performed or assessed with clinical judgement   ADL Overall ADL's : Needs assistance/impaired Eating/Feeding: Independent   Grooming: Wash/dry hands   Upper Body Bathing: Minimal assistance   Lower Body Bathing: Maximal assistance   Upper Body Dressing : Minimal assistance   Lower Body Dressing: Maximal assistance   Toilet Transfer: Minimal assistance;BSC;RW             General ADL Comments: Pt progressed from bed to chair this session and becoming nauseated. RN providing medication via IV     Vision         Perception     Praxis      Pertinent Vitals/Pain Pain Assessment: 0-10 Pain Score: 9  Pain Location: right hip Pain Descriptors / Indicators: Guarding;Sore Pain Intervention(s): Repositioned;Monitored during session     Hand Dominance Right   Extremity/Trunk Assessment Upper Extremity Assessment Upper Extremity Assessment: Overall WFL for tasks assessed   Lower Extremity Assessment Lower Extremity Assessment: Defer to PT evaluation RLE Deficits / Details: Ankle AROM WFL; limited movement secondary to post op and pain. RLE:  Unable to fully assess due to pain RLE Sensation: (sensation changes in R LE hip to knee)   Cervical / Trunk Assessment Cervical / Trunk Assessment: Normal   Communication Communication Communication: No difficulties   Cognition Arousal/Alertness: Awake/alert Behavior During Therapy: WFL for tasks  assessed/performed Overall Cognitive Status: Within Functional Limits for tasks assessed                                     General Comments  Pink foam placed on skin tear on left upper chest. RN aware.    Exercises     Shoulder Instructions      Home Living Family/patient expects to be discharged to:: Private residence Living Arrangements: Children;Parent(57 years-98 years old) Available Help at Discharge: Family;Available 24 hours/day Type of Home: House Home Access: Stairs to enter [employer] of Steps: 1   Home Layout: Two level Alternate Level Stairs-Number of Steps: 1 flight Alternate Level Stairs-Rails: Right Bathroom Shower/Tub: Walk-in shower;Tub/shower unit   Bathroom Toilet: Standard     Home Equipment: None   Additional Comments: lives with 8 children ( 38 yo to 40 yo)      Prior Functioning/Environment Level of Independence: Independent        Comments: Does her own Adls, drives.        OT Problem List: Decreased activity tolerance;Impaired balance (sitting and/or standing);Decreased safety awareness;Decreased knowledge of use of DME or AE;Decreased knowledge of precautions;Obesity;Pain      OT Treatment/Interventions: Self-care/ADL training;Therapeutic exercise;Energy conservation;DME and/or AE instruction;Manual therapy;Therapeutic activities;Patient/family education;Balance training    OT Goals(Current goals can be found in the care plan section) Acute Rehab OT Goals Patient Stated Goal: to be able to get up my stairs to get to my bedroom OT Goal Formulation: With patient Time For Goal Achievement: 01/09/19 Potential to Achieve Goals: Good  OT Frequency: Min 3X/week   Barriers to D/C:            Co-evaluation PT/OT/SLP Co-Evaluation/Treatment: Yes Reason for Co-Treatment: For patient/therapist safety;To address functional/ADL transfers PT goals addressed during session: Mobility/safety with mobility;Balance;Proper  use of DME OT goals addressed during session: ADL's and self-care;Proper use of Adaptive equipment and DME;Strengthening/ROM      AM-PAC OT "6 Clicks" Daily Activity     Outcome Measure Help from another person eating meals?: None Help from another person taking care of personal grooming?: None Help from another person toileting, which includes using toliet, bedpan, or urinal?: A Little Help from another person bathing (including washing, rinsing, drying)?: A Little Help from another person to put on and taking off regular upper body clothing?: None Help from another person to put on and taking off regular lower body clothing?: A Lot 6 Click Score: 20   End of Session Equipment Utilized During Treatment: Gait belt;Rolling walker  Activity Tolerance: Treatment limited secondary to medical complications (Comment)(nauseated) Patient left: in chair;with call bell/phone within reach;with nursing/sitter in room  OT Visit Diagnosis: Unsteadiness on feet (R26.81)                Time: [id number] OT Time Calculation (min): 31 min Charges:  OT General Charges $OT Visit: 1 Visit OT Evaluation $OT Eval Moderate Complexity: 1 Mod   Jeri Modena, OTR/L  Acute Rehabilitation Services Pager: [phone] Office: [phone] .   Jeri Modena 12/26/2018, 1:46 PM

## 2018-12-26 NOTE — Consult Note (Addendum)
Radiation Oncology         [phone]) [phone] ________________________________  Name: Tiffany [name] MRN: [id number]  Date of Service: 12/25/2018 DOB: 02-16-1976  CC:Tiffany Limbo, MD  No ref. provider found     REFERRING PHYSICIAN: No ref. provider found   DIAGNOSIS: right acetabular fracture  HISTORY OF PRESENT ILLNESS:Tiffany [name] is a 43 y.o. female who is seen for an initial consultation visit regarding the patient's diagnosis of acetabular fracture.  The patient had been working out on her yard and fell about 2 weeks ago. Her initial films did not show fracture but persistent symptoms led to a CT of the right hip that showed a large posterior fracture of the right acetabulum. She met with Dr. Marcelino Scot and surgery has been recommended for the patient. She is on her way to [facility] now to check in for her procedure. Given the nature of the injury and plan intervention, the patient is felt to be at significant risk for the development of heterotopic ossification. We have therefore been asked to see the patient today for consideration of postoperative radiation treatment for the prevention of heterotopic ossification postoperatively.   PREVIOUS RADIATION THERAPY: No   PAST MEDICAL HISTORY:  has a past medical history of Abnormal Pap smear (1997), Allergy, Anemia, Anxiety, Arthritis, Blood dyscrasia, Blood transfusion without reported diagnosis (2005), Breast mass in female (2003), Broken foot (03/16/2016), Bruises easily, BV (bacterial vaginosis), Cancer ([place]), Cyst of ovary (2003), Depression (2003), DUB (dysfunctional uterine bleeding) (2003), DVT, lower extremity ([place]) (10/2014), Fatigue (06/10/2018), Fibroid, Fibromyalgia (2004), Fibrositis, GERD (gastroesophageal reflux disease) (2007), H/O amenorrhea (2006), H/O menorrhagia (2011), H/O sickle cell trait, Heart murmur (2007), History of abnormal cervical Pap smear (2003), UTI (urinary tract infection) (2003), Hypertension (2005), IBD (inflammatory  bowel disease), IBS (irritable bowel syndrome), Infection, Low back pain (2003), Menses, irregular (2003), Migraine, Nausea and vomiting (06/10/2018), Neck pain (06/10/2018), Night sweats (06/10/2018), Obese (2003), Pulmonary embolism ([place]) (10/2014), Sacroiliitis ([place]), Sickle-cell trait ([place]), Suspected Covid-19 Virus Infection (07/28/2018), Trichomonas, Uterine leiomyoma, Vaginitis and vulvovaginitis (2004), Weight loss (06/10/2018), and Yeast vaginitis (04/24/2010).     PAST SURGICAL HISTORY: Past Surgical History:  Procedure Laterality Date   AUGMENTATION MAMMAPLASTY  8003   Silicone implants   broken ankle Right    CERVICAL CONE BIOPSY  1997   COSMETIC SURGERY  2012   thigh lift   CYSTECTOMY Right    obtain date from patient, was not on history form dated 06/16/10--benign cyst removed Rt.ovary   DILATION AND CURETTAGE OF UTERUS     LAPAROSCOPIC GASTRIC BANDING  05/20/2007   WISDOM TOOTH EXTRACTION       FAMILY HISTORY: family history includes Alcohol abuse in her father and maternal uncle; Arthritis in her mother; Birth defects in her maternal aunt; Breast cancer in her cousin; Breast cancer (age of onset: 23) in her maternal aunt; COPD in her maternal grandfather; Cervical cancer (age of onset: 53) in her sister; Colon polyps in her maternal aunt and mother; Dementia in her mother; Depression in her maternal aunt, maternal grandmother, and mother; Diabetes in her mother; Drug abuse in her father; Early death in her maternal aunt; Early death (age of onset: 20) in her maternal aunt; Fibromyalgia in her mother; Heart attack in her paternal uncle; Heart disease in her father; Hyperlipidemia in her mother; Hypertension in her father; Liver disease in her paternal aunt; Lung cancer (age of onset: 40) in her maternal grandmother; Miscarriages / Stillbirths in her maternal grandmother; Multiple sclerosis in  her brother; Other in her maternal grandmother and mother; Ovarian cancer in her sister; Ovarian  cancer (age of onset: 14) in her maternal aunt and maternal aunt; Stroke in her father and maternal grandfather; Thyroid disease in her brother.   SOCIAL HISTORY:  reports that she has never smoked. She has never used smokeless tobacco. She reports current alcohol use of about 1.0 standard drinks of alcohol per week. She reports that she does not use drugs. The patient is single and lives in [place].    ALLERGIES: Flexeril [cyclobenzaprine hcl], Latex, Lamotrigine, Seasonal ic  [cholestatin], and Other   MEDICATIONS:  Current Facility-Administered Medications  Medication Dose Route Frequency Provider Last Rate Last Dose   0.9 % NaCl with KCl 20 mEq/ L  infusion   Intravenous Continuous Ainsley Spinner, PA-C 50 mL/hr at 12/26/18 0041     acetaminophen (TYLENOL) tablet 325-650 mg  325-650 mg Oral Q6H PRN Ainsley Spinner, PA-C       acetaminophen (TYLENOL) tablet 500 mg  500 mg Oral Q12H Ainsley Spinner, PA-C   500 mg at 12/26/18 0043   bisacodyl (DULCOLAX) EC tablet 5 mg  5 mg Oral Daily PRN Ainsley Spinner, PA-C       ceFAZolin (ANCEF) IVPB 1 g/50 mL premix  1 g Intravenous Q6H Ainsley Spinner, PA-C 100 mL/hr at 12/26/18 0619 1 g at 12/26/18 7494   diphenhydrAMINE (BENADRYL) 12.5 MG/5ML elixir 12.5-25 mg  12.5-25 mg Oral Q4H PRN Ainsley Spinner, PA-C       docusate sodium (COLACE) capsule 100 mg  100 mg Oral BID Ainsley Spinner, PA-C   100 mg at 12/26/18 0049   HYDROcodone-acetaminophen (NORCO) 7.5-325 MG per tablet 1-2 tablet  1-2 tablet Oral Q4H PRN Ainsley Spinner, PA-C   1 tablet at 12/26/18 4967   HYDROcodone-acetaminophen (NORCO/VICODIN) 5-325 MG per tablet 1-2 tablet  1-2 tablet Oral Q4H PRN Ainsley Spinner, PA-C       methocarbamol (ROBAXIN) tablet 750 mg  750 mg Oral TID Ainsley Spinner, PA-C       Or   methocarbamol (ROBAXIN) 500 mg in dextrose 5 % 50 mL IVPB  500 mg Intravenous TID Ainsley Spinner, PA-C       metoCLOPramide (REGLAN) tablet 5-10 mg  5-10 mg Oral Q8H PRN Ainsley Spinner, PA-C       Or    metoCLOPramide (REGLAN) injection 5-10 mg  5-10 mg Intravenous Q8H PRN Ainsley Spinner, PA-C       morphine 2 MG/ML injection 0.5-1 mg  0.5-1 mg Intravenous Q2H PRN Ainsley Spinner, PA-C   1 mg at 12/26/18 0511   ondansetron (ZOFRAN) tablet 4 mg  4 mg Oral Q6H PRN Ainsley Spinner, PA-C       Or   ondansetron [facility]) injection 4 mg  4 mg Intravenous Q6H PRN Ainsley Spinner, PA-C       polyethylene glycol (MIRALAX / GLYCOLAX) packet 17 g  17 g Oral Daily Ainsley Spinner, PA-C       rivaroxaban Alveda Reasons) tablet 10 mg  10 mg Oral Daily Franky Macho, [facility]         REVIEW OF SYSTEMS:  On review of systems, the patient reports that she is doing well overall. She is anxious to have her surgery. She denies any chest pain, shortness of breath, cough, fevers, chills, night sweats, unintended weight changes. She is in pain from her fall but denies any bowel or bladder disturbances, and denies abdominal pain, nausea or vomiting. She denies any other musculoskeletal or  joint aches or pains. A complete review of systems is obtained and is otherwise negative.     PHYSICAL EXAM:  height is 5' 6"  (1.676 m) and weight is 200 lb (90.7 kg). Her oral temperature is 97.8 F (36.6 C). Her blood pressure is 111/77 and her pulse is 95. Her respiration is 18 and oxygen saturation is 100%.   Unable to assess due to encounter type  ECOG = 2  0 - Asymptomatic (Fully active, able to carry on all predisease activities without restriction)  1 - Symptomatic but completely ambulatory (Restricted in physically strenuous activity but ambulatory and able to carry out work of a light or sedentary nature. For example, light housework, office work)  2 - Symptomatic, <50% in bed during the day (Ambulatory and capable of all self care but unable to carry out any work activities. Up and about more than 50% of waking hours)  3 - Symptomatic, >50% in bed, but not bedbound (Capable of only limited self-care, confined to bed or chair 50% or more of  waking hours)  4 - Bedbound (Completely disabled. Cannot carry on any self-care. Totally confined to bed or chair)  5 - Death   Eustace Pen MM, Creech RH, Tormey DC, et al. [phone]). "Toxicity and response criteria of the [facility]". [place] Oncol. 5 (6): 649-55    LABORATORY DATA:  Lab Results  Component Value Date   WBC 4.2 12/25/2018   HGB 9.9 (L) 12/25/2018   HCT 29.0 (L) 12/25/2018   MCV 78.8 (L) 12/25/2018   PLT 343 12/25/2018   Lab Results  Component Value Date   NA 137 12/25/2018   K 3.4 (L) 12/25/2018   CL 105 12/25/2018   CO2 19 (L) 12/25/2018   Lab Results  Component Value Date   ALT 10 12/25/2018   AST 16 12/25/2018   ALKPHOS 58 12/25/2018   BILITOT 0.4 12/25/2018      RADIOGRAPHY: Ct Hip Right Wo Contrast  Result Date: 12/22/2018 CLINICAL DATA:  Right hip pain after a fall EXAM: CT OF THE RIGHT HIP WITHOUT CONTRAST TECHNIQUE: Multidetector CT imaging of the right hip was performed according to the standard protocol. Multiplanar CT image reconstructions were also generated. COMPARISON:  Radiograph December 14, 2018 FINDINGS: Bones/Joint/Cartilage There is a comminuted mildly displaced fracture involving the posterior wall and column of the acetabulum with intra-articular extension. The femoral head is still well seated within the acetabulum. No other fracture is identified. The posterior ischium and visualized sacroiliac joint is intact. Ligaments Suboptimally assessed by CT. Muscles and Tendons The muscles surrounding the hip are intact without evidence of focal tear or muscular atrophy. Although suboptimally visualized no gross tendon tear is seen. Soft tissues Mild subcutaneous edema seen overlying the hip. There is a small hip joint effusion. The visualized portion of the deep pelvis is grossly unremarkable. IMPRESSION: Comminuted intra-articular fracture involving the posterior wall and column of the right acetabulum. These results were  called by telephone at the time of interpretation on 12/22/2018 at 5:06 pm to provider [facility] , who verbally acknowledged these results. Electronically Signed   By: Prudencio Pair M.D.   On: 12/22/2018 17:06   Dg Chest Portable 1 View  Result Date: 12/25/2018 CLINICAL DATA:  Preoperative exam for repair of acetabular fracture. EXAM: PORTABLE CHEST 1 VIEW COMPARISON:  June 22, 2018 FINDINGS: The heart size and mediastinal contours are within normal limits. Both lungs are clear. The visualized skeletal structures are unremarkable.  IMPRESSION: No active disease. Electronically Signed   By: Dorise Bullion III M.D   On: 12/25/2018 15:00   Dg C-arm 1-60 Min  Result Date: 12/25/2018 CLINICAL DATA:  Right-sided acetabular fracture EXAM: OPERATIVE RIGHT HIP WITH PELVIS; DG C-ARM 1-60 MIN COMPARISON:  None. FLUOROSCOPY TIME:  Radiation Exposure Index (as provided by the fluoroscopic device): Not available If the device does not provide the exposure index: Fluoroscopy Time:  14 seconds Number of Acquired Images:  9 FINDINGS: Initial images demonstrate multiple guidance pins traversing the known acetabular fracture. Two fixation side plates are subsequently placed. Fracture fragments are in near anatomic alignment. IMPRESSION: Status post ORIF of a right acetabular fracture. Electronically Signed   By: Inez Catalina M.D.   On: 12/25/2018 22:31   Dg Hip Operative Unilat W Or W/o Pelvis Right  Result Date: 12/25/2018 CLINICAL DATA:  Right-sided acetabular fracture EXAM: OPERATIVE RIGHT HIP WITH PELVIS; DG C-ARM 1-60 MIN COMPARISON:  None. FLUOROSCOPY TIME:  Radiation Exposure Index (as provided by the fluoroscopic device): Not available If the device does not provide the exposure index: Fluoroscopy Time:  14 seconds Number of Acquired Images:  9 FINDINGS: Initial images demonstrate multiple guidance pins traversing the known acetabular fracture. Two fixation side plates are subsequently placed. Fracture  fragments are in near anatomic alignment. IMPRESSION: Status post ORIF of a right acetabular fracture. Electronically Signed   By: Inez Catalina M.D.   On: 12/25/2018 22:31   Dg Hip Unilat With Pelvis 2-3 Views Right  Result Date: 12/14/2018 CLINICAL DATA:  Right hip pain for 2 days. No previous injury or fracture. EXAM: DG HIP (WITH OR WITHOUT PELVIS) 2-3V RIGHT COMPARISON:  None. FINDINGS: There are lucencies overlying the right femoral head which are likely artifactual. There is no definite hip fracture or dislocation. There are mild degenerative changes similar to the left hip. No acute finding in the visualized skeleton. Nonobstructive bowel gas pattern. IMPRESSION: Lucencies overlying the right femoral head are favored to be artifactual. If there is persistent concern for right hip pathology, consider CT or MRI. Electronically Signed   By: Audie Pinto M.D.   On: 12/14/2018 16:42   Vas [place] Lower Extremity Venous (dvt)  Result Date: 12/18/2018  Lower Venous Study Indications: Pain (lateral thigh), and Recent fall with hip injury, possible pelvic fracture.  Limitations: Body habitus and Patient's inability to straighten or position leg for best imaging. Comparison Study: Prior study from 06/21/18 is available for comparison Performing Technologist: Sharion Dove RVS  Examination Guidelines: A complete evaluation includes B-mode imaging, spectral Doppler, color Doppler, and power Doppler as needed of all accessible portions of each vessel. Bilateral testing is considered an integral part of a complete examination. Limited examinations for reoccurring indications may be performed as noted.  +---------+---------------+---------+-----------+----------+---------------+  RIGHT     Compressibility Phasicity Spontaneity Properties Thrombus Aging   +---------+---------------+---------+-----------+----------+---------------+  CFV       Full            Yes       Yes                                      +---------+---------------+---------+-----------+----------+---------------+  SFJ       Full                                                              +---------+---------------+---------+-----------+----------+---------------+  FV Prox                   Yes       Yes                    patent by color  +---------+---------------+---------+-----------+----------+---------------+  FV Mid                    Yes       Yes                    patent by color  +---------+---------------+---------+-----------+----------+---------------+  FV Distal Full                                                              +---------+---------------+---------+-----------+----------+---------------+  PFV                       Yes       Yes                    patent by color  +---------+---------------+---------+-----------+----------+---------------+  POP       Full            Yes       Yes                                     +---------+---------------+---------+-----------+----------+---------------+  PTV       Full                                                              +---------+---------------+---------+-----------+----------+---------------+  PERO      Full                                                              +---------+---------------+---------+-----------+----------+---------------+     Summary: Right: Findings appear essentially unchanged compared to previous examination. There is no evidence of deep vein thrombosis in the lower extremity. Left: No evidence of common femoral vein obstruction.  *See table(s) above for measurements and observations. Electronically signed by Monica Martinez MD on 12/18/2018 at 3:16:25 PM.    Final        IMPRESSION:  The patient has been diagnosed with a acetabular fracture of the right hip. The patient is a good candidate for one fraction of postoperative radiation treatment for the prevention of the development of heterotopic ossification.  I have discussed the  rationale of this treatment with the patient. I have discussed the possible/expected benefit of such a treatment. I have also discussed the possible side effects and risks of treatment as well. All of the patient's questions have been answered. She is of child bearing age and does not use contraceptives or had any surgical procedures to prevent pregnancy. We  will proceed with stat urine hcg upon hospital arrival with her preop labs.    PLAN: The patient will undergo simulation and one fraction of external beam radiation treatment. This will be completed to a dose of 7 Gy. This treatment will be completed tomorrow on postoperative day #1.       ________________________________ In a visit lasting 30 minutes, greater than 50% of the time was spent in floor time and by phone, coordinating the patient's care.     Carola Rhine, PAC On behalf of  Jodelle Gross, MD, PhD   **Disclaimer: This note was dictated with voice recognition software. Similar sounding words can inadvertently be transcribed and this note may contain transcription errors which may not have been corrected upon publication of note.**

## 2018-12-26 NOTE — Progress Notes (Addendum)
1410 pt given prn medication for pain prior to transport to [facility] for treatment. Report given to [facility].  1637 pt back.  1745 Foley out, educated about procedure and need to void. Pt still feeling  Nauseated, concerned that Norco might be the culprit - pt would like to change prn pain medication to oxycodone, which she was on prior to hospitalized.

## 2018-12-27 LAB — BASIC METABOLIC PANEL
Anion gap: 8 (ref 5–15)
BUN: 6 mg/dL (ref 6–20)
CO2: 25 mmol/L (ref 22–32)
Calcium: 8.4 mg/dL — ABNORMAL LOW (ref 8.9–10.3)
Chloride: 104 mmol/L (ref 98–111)
Creatinine, Ser: 0.95 mg/dL (ref 0.44–1.00)
GFR calc Af Amer: >60 mL/min (ref >60)
GFR calc non Af Amer: >60 mL/min (ref >60)
Glucose, Bld: 105 mg/dL — ABNORMAL HIGH (ref 70–99)
Potassium: 4.1 mmol/L (ref 3.5–5.1)
Sodium: 137 mmol/L (ref 135–145)

## 2018-12-27 LAB — CBC
HCT: 29.1 % — ABNORMAL LOW (ref 36.0–46.0)
Hemoglobin: 9.2 g/dL — ABNORMAL LOW (ref 12.0–15.0)
MCH: 24.8 pg — ABNORMAL LOW (ref 26.0–34.0)
MCHC: 31.6 g/dL (ref 30.0–36.0)
MCV: 78.4 fL — ABNORMAL LOW (ref 80.0–100.0)
Platelets: 371 10*3/uL (ref 150–400)
RBC: 3.71 MIL/uL — ABNORMAL LOW (ref 3.87–5.11)
RDW: 20 % — ABNORMAL HIGH (ref 11.5–15.5)
WBC: 10.2 10*3/uL (ref 4.0–10.5)
nRBC: 0 % (ref 0.0–0.2)

## 2018-12-27 LAB — VITAMIN D 25 HYDROXY (VIT D DEFICIENCY, FRACTURES): Vit D, 25-Hydroxy: 18.2 ng/mL — ABNORMAL LOW (ref 30.0–100.0)

## 2018-12-27 LAB — PTH, INTACT AND CALCIUM
Calcium, Total (PTH): 8.7 mg/dL (ref 8.7–10.2)
PTH: 32 pg/mL (ref 15–65)

## 2018-12-27 MED ORDER — HYDROMORPHONE HCL 2 MG PO TABS
2.0000 mg | ORAL_TABLET | ORAL | Status: DC | PRN
  Administered 2018-12-27 – 2018-12-29 (×11): 4 mg via ORAL
  Filled 2018-12-27 (×11): qty 2

## 2018-12-27 MED ORDER — HYDROMORPHONE HCL 2 MG PO TABS: 4.0000 mg | ORAL_TABLET | ORAL | Status: DC | PRN

## 2018-12-27 MED ORDER — HYDROMORPHONE HCL 2 MG PO TABS: 2.0000 mg | ORAL_TABLET | ORAL | 0 refills | Status: DC | PRN

## 2018-12-27 NOTE — Progress Notes (Signed)
Physical Therapy Treatment Patient Details Name: Tiffany [name] MRN: [id number] DOB: 11-Jul-1975 Today's Date: 12/27/2018    History of Present Illness Patient is a 43 y/o female who presents with right posterior acetabular fracture s/p ORIF. Plan for postoperative radiation treatment for the prevention of heterotopic ossification. PMH includes but not limited to sickle cell trait, PE, HTN, DVT, depression, fibyomyalgia,anxiety.    PT Comments    Pt supine in bed on arrival.  She reports nausea and sensitivity to light.  Pt refused OOB activity but aggreable to supine LE exercises.  Pt issued HEP post session for continued strengthening when PT is not present.  Pt resting in internal rotation on arrival and re-educated on compliance with her hip precautions.  Plan next session for OOB mobility and progression to stair training as able.  Pt reports she may d/c home tomorrow but at this point is does not seem feasible with how little she has participated.     Follow Up Recommendations  Home health PT;Supervision for mobility/OOB;Supervision/Assistance - 24 hour     Equipment Recommendations  Rolling walker with 5" wheels;3in1 (PT)    Recommendations for Other Services       Precautions / Restrictions Precautions Precautions: Fall;Posterior Hip Precaution Comments: Reviewed posterior hip precautions, required increased time to recall but able to. Restrictions Weight Bearing Restrictions: Yes RLE Weight Bearing: Touchdown weight bearing    Mobility  Bed Mobility Overal bed mobility: Needs Assistance             General bed mobility comments: Pt required min guard to reposition RLE in bed.  On arrival patient resting in IR with pillow under her R hip.  She required cues to and assistance to correct.  Pt educated to keep hip in neutral position with toes and knee on RLE pointed toward the ceiling.  Transfers                    Ambulation/Gait                  Stairs             Wheelchair Mobility    Modified Rankin (Stroke Patients Only)       Balance                                            Cognition Arousal/Alertness: Awake/alert Behavior During Therapy: WFL for tasks assessed/performed Overall Cognitive Status: Within Functional Limits for tasks assessed                                 General Comments: limited session due to nausea.      Exercises Total Joint Exercises Ankle Circles/Pumps: AROM;Both;20 reps;Supine Quad Sets: AROM;10 reps;Supine;Right Short Arc Quad: AROM;Right;10 reps;Supine Heel Slides: AAROM;Right;5 reps;Supine Hip ABduction/ADduction: AAROM;Right;10 reps;Supine    General Comments        Pertinent Vitals/Pain Pain Assessment: Faces Faces Pain Scale: Hurts even more Pain Location: right hip Pain Descriptors / Indicators: Guarding;Sore Pain Intervention(s): Monitored during session;Repositioned    Home Living                      Prior Function            PT Goals (current goals can now be found in  the care plan section) Acute Rehab PT Goals Patient Stated Goal: to be able to get up my stairs to get to my bedroom PT Goal Formulation: With patient Potential to Achieve Goals: Good Progress towards PT goals: Progressing toward goals    Frequency    Min 4X/week      PT Plan Current plan remains appropriate    Co-evaluation              AM-PAC PT "6 Clicks" Mobility   Outcome Measure  Help needed turning from your back to your side while in a flat bed without using bedrails?: A Little Help needed moving from lying on your back to sitting on the side of a flat bed without using bedrails?: A Little Help needed moving to and from a bed to a chair (including a wheelchair)?: A Little Help needed standing up from a chair using your arms (e.g., wheelchair or bedside chair)?: A Little Help needed to walk in hospital room?: A  Little Help needed climbing 3-5 steps with a railing? : A Lot 6 Click Score: 17    End of Session Equipment Utilized During Treatment: Gait belt Activity Tolerance: Other (comment);Patient limited by pain(nausea continues to limit progress at this time.) Patient left: with call bell/phone within reach;with nursing/sitter in room;in bed Nurse Communication: Mobility status;Other (comment)(informed nursing of nausea) PT Visit Diagnosis: Pain;Difficulty in walking, not elsewhere classified (R26.2) Pain - Right/Left: Right Pain - part of body: Hip     Time: [id number] PT Time Calculation (min) (ACUTE ONLY): 18 min  Charges:  $Therapeutic Exercise: 8-22 mins                     Governor Rooks, PTA Acute Rehabilitation Services Pager [phone] Office [phone]     Tiffany [name] 12/27/2018, 12:16 PM

## 2018-12-27 NOTE — Progress Notes (Signed)
Occupational Therapy Treatment Patient Details Name: Tiffany [name] MRN: [id number] DOB: November 22, 1975 Today's Date: 12/27/2018    History of present illness Patient is a 43 y/o female who presents with right posterior acetabular fracture s/p ORIF. Plan for postoperative radiation treatment for the prevention of heterotopic ossification. PMH includes but not limited to sickle cell trait, PE, HTN, DVT, depression, fibyomyalgia,anxiety.   OT comments  Patient supine in bed and refused OOB activity, but agreeable to AE education.  Reviewed use of reacher, sock aide, long sponge and long handled shoe horn.  Pt return demonstrated use of reacher, sock aide to don/doff B socks with min assist.  Pt able to verbalize hip precautions, education provided in relation to ADLs.  Will need to mobilize more prior to dc home, reports "difficultly with pain control".    Follow Up Recommendations  Home health OT    Equipment Recommendations  3 in 1 bedside commode;Other (comment)(RW, hip kit)    Recommendations for Other Services      Precautions / Restrictions Precautions Precautions: Fall;Posterior Hip Precaution Comments: patient able to recall posterior hip precautions without cueing  Restrictions Weight Bearing Restrictions: Yes RLE Weight Bearing: Touchdown weight bearing       Mobility Bed Mobility Overal bed mobility: Needs Assistance             General bed mobility comments: patient requested pillow between ankles to assist with neutral position of R toes   Transfers                 General transfer comment: declines OOB today    Balance                                           ADL either performed or assessed with clinical judgement   ADL Overall ADL's : Needs assistance/impaired             Lower Body Bathing: Minimal assistance;With adaptive equipment Lower Body Bathing Details (indicate cue type and reason): reviewed LB bathing from sitting  position, pt declines OOB today; educated on use of long sponge      Lower Body Dressing: Moderate assistance;Bed level;Cueing for compensatory techniques;With adaptive equipment Lower Body Dressing Details (indicate cue type and reason): reviewed use of AE, return demonstrated use of reacher, sock aide with min assist (declined OOB)                General ADL Comments: declined OOB due to pain and nausea     Vision       Perception     Praxis      Cognition Arousal/Alertness: Awake/alert Behavior During Therapy: WFL for tasks assessed/performed Overall Cognitive Status: Within Functional Limits for tasks assessed                                 General Comments: limited session due to pain/nausea        Exercises Total Joint Exercises Ankle Circles/Pumps: AROM;Both;20 reps;Supine Quad Sets: AROM;10 reps;Supine;Right Short Arc Quad: AROM;Right;10 reps;Supine Heel Slides: AAROM;Right;5 reps;Supine Hip ABduction/ADduction: AAROM;Right;10 reps;Supine   Shoulder Instructions       General Comments      Pertinent Vitals/ Pain       Pain Assessment: 0-10 Pain Score: 9  Faces Pain Scale: Hurts even more Pain Location: right  hip Pain Descriptors / Indicators: Guarding;Sore Pain Intervention(s): Monitored during session;Repositioned  Home Living                                          Prior Functioning/Environment              Frequency  Min 3X/week        Progress Toward Goals  OT Goals(current goals can now be found in the care plan section)  Progress towards OT goals: Progressing toward goals  Acute Rehab OT Goals Patient Stated Goal: to be able to get up my stairs to get to my bedroom OT Goal Formulation: With patient  Plan Discharge plan remains appropriate;Frequency remains appropriate    Co-evaluation                 AM-PAC OT "6 Clicks" Daily Activity     Outcome Measure   Help from another  person eating meals?: None Help from another person taking care of personal grooming?: None Help from another person toileting, which includes using toliet, bedpan, or urinal?: A Little Help from another person bathing (including washing, rinsing, drying)?: A Little Help from another person to put on and taking off regular upper body clothing?: None Help from another person to put on and taking off regular lower body clothing?: A Lot 6 Click Score: 20    End of Session    OT Visit Diagnosis: Unsteadiness on feet (R26.81)   Activity Tolerance Treatment limited secondary to medical complications (Comment);Patient limited by pain(nausea)   Patient Left in bed;with call bell/phone within reach;with bed alarm set   Nurse Communication          Time: 2241-1464 OT Time Calculation (min): 21 min  Charges: OT General Charges $OT Visit: 1 Visit OT Treatments $Self Care/Home Management : 8-22 mins  Delight Stare, [place] Pager [phone] Office [phone]    Delight Stare 12/27/2018, 1:02 PM

## 2018-12-27 NOTE — Progress Notes (Signed)
Subjective: 2 Days Post-Op Procedure(s) (LRB): OPEN REDUCTION INTERNAL FIXATION (ORIF) ACETABULAR FRACTURE (Right) Patient reports pain as moderate and severe.    Objective: Vital signs in last 24 hours: Temp:  [98.2 F (36.8 C)-99.1 F (37.3 C)] 98.8 F (37.1 C) (09/19 0811) Pulse Rate:  [99-107] 100 (09/19 0811) Resp:  [15-18] 15 (09/19 0811) BP: (94-128)/(59-87) 94/63 (09/19 0809) SpO2:  [83 %-98 %] 97 % (09/19 0811)  Intake/Output from previous day: 09/18 0701 - 09/19 0700 In: -  Out: 1525 [Urine:1525] Intake/Output this shift: No intake/output data recorded.  Recent Labs    12/25/18 1522 12/25/18 2008 12/25/18 2130 12/26/18 0835 12/27/18 0648  HGB 9.3* 9.2* 9.9* 9.3* 9.2*   Recent Labs    12/26/18 0835 12/27/18 0648  WBC 9.4 10.2  RBC 3.73* 3.71*  HCT 28.8* 29.1*  PLT 425* 371   Recent Labs    12/26/18 0835 12/27/18 0648  NA 136 137  K 4.1 4.1  CL 108 104  CO2 23 25  BUN 5* 6  CREATININE 0.76 0.95  GLUCOSE 103* 105*  CALCIUM 8.5*  8.7 8.4*   Recent Labs    12/25/18 1522  INR 1.1    Neurovascular intact Sensation intact distally Intact pulses distally Dorsiflexion/Plantar flexion intact Incision: dressing C/D/I   Assessment/Plan: 2 Days Post-Op Procedure(s) (LRB): OPEN REDUCTION INTERNAL FIXATION (ORIF) ACETABULAR FRACTURE (Right) Up with therapy Plan for discharge tomorrow TDWB RLE post hip precautions  Made some adjustments with po pain meds to better transition to home will monitor today possible d/c home tomorrow    Chriss Czar 12/27/2018, 11:01 AM

## 2018-12-28 MED ORDER — HYDROMORPHONE HCL 1 MG/ML IJ SOLN
0.5000 mg | INTRAMUSCULAR | Status: DC | PRN
  Administered 2018-12-28: 1 mg via INTRAVENOUS
  Filled 2018-12-28 (×2): qty 1

## 2018-12-28 NOTE — Progress Notes (Signed)
Occupational Therapy Treatment Patient Details Name: Tiffany [name] MRN: [id number] DOB: 08-08-1975 Today's Date: 12/28/2018    History of present illness Patient is a 43 y/o female who presents with right posterior acetabular fracture s/p ORIF. Plan for postoperative radiation treatment for the prevention of heterotopic ossification. PMH includes but not limited to sickle cell trait, PE, HTN, DVT, depression, fibyomyalgia,anxiety.   OT comments  Patient progressing slowly.  Able to complete transfers using RW to 3:1 commode with close supervision today, min cueing for hip precautions.  Able to return demonstrate use of AE for LB dressing without cueing, LB dressing with supervision overall.  Pt slow moving, but no physical assist required.  Declined practicing shower transfers. She will benefit from further OT to ensure adherence to precautions functionally and progress towards PLOF at [facility] level.    Follow Up Recommendations  Home health OT    Equipment Recommendations  3 in 1 bedside commode;Other (comment)(RW, hip kit)    Recommendations for Other Services      Precautions / Restrictions Precautions Precautions: Fall;Posterior Hip Precaution Comments: patient able to recall posterior hip precautions without cueing, given increased time   Restrictions Weight Bearing Restrictions: Yes RLE Weight Bearing: Touchdown weight bearing       Mobility Bed Mobility               General bed mobility comments: OOB upon entry  Transfers Overall transfer level: Needs assistance Equipment used: Rolling walker (2 wheeled) Transfers: Sit to/from Stand Sit to Stand: Supervision         General transfer comment: supervision for safety, cueing for mgmt of R LE and hip precautions; pt slow moving but no phyiscal assist required     Balance Overall balance assessment: Needs assistance Sitting-balance support: Feet supported;No upper extremity supported Sitting balance-Leahy Scale:  Fair     Standing balance support: Bilateral upper extremity supported;During functional activity;No upper extremity supported Standing balance-Leahy Scale: Fair Standing balance comment: relaint on BUE support dynamically, but able to stand statically without UE support briefly                           ADL either performed or assessed with clinical judgement   ADL Overall ADL's : Needs assistance/impaired                     Lower Body Dressing: Supervision/safety;Sit to/from stand Lower Body Dressing Details (indicate cue type and reason): pt return demonstrated use of reacher, sock aide for LB dressing; sit to stand supervision; good recall of compensatory techniques Toilet Transfer: Supervision/safety;RW;BSC;Ambulation Toilet Transfer Details (indicate cue type and reason): educated on use of 3:1 commode, next to bed, over commode and in shower        Tub/Shower Transfer Details (indicate cue type and reason): verbally educated on and demonstrated technique (but patient declined practicing) Functional mobility during ADLs: Supervision/safety;Rolling walker General ADL Comments: pt limited by pain      Vision       Perception     Praxis      Cognition Arousal/Alertness: Awake/alert Behavior During Therapy: WFL for tasks assessed/performed Overall Cognitive Status: Within Functional Limits for tasks assessed                                          Exercises  Shoulder Instructions       General Comments      Pertinent Vitals/ Pain       Pain Assessment: Faces Faces Pain Scale: Hurts even more Pain Location: right hip Pain Descriptors / Indicators: Guarding;Sore Pain Intervention(s): Monitored during session;Repositioned  Home Living                                          Prior Functioning/Environment              Frequency  Min 3X/week        Progress Toward Goals  OT Goals(current  goals can now be found in the care plan section)  Progress towards OT goals: Progressing toward goals  Acute Rehab OT Goals Patient Stated Goal: to be able to get up my stairs to get to my bedroom OT Goal Formulation: With patient  Plan Discharge plan remains appropriate;Frequency remains appropriate    Co-evaluation                 AM-PAC OT "6 Clicks" Daily Activity     Outcome Measure   Help from another person eating meals?: None Help from another person taking care of personal grooming?: None Help from another person toileting, which includes using toliet, bedpan, or urinal?: A Little Help from another person bathing (including washing, rinsing, drying)?: A Little Help from another person to put on and taking off regular upper body clothing?: None Help from another person to put on and taking off regular lower body clothing?: A Little 6 Click Score: 21    End of Session Equipment Utilized During Treatment: Rolling walker  OT Visit Diagnosis: Unsteadiness on feet (R26.81)   Activity Tolerance Patient tolerated treatment well   Patient Left in chair;with call bell/phone within reach;with nursing/sitter in room   Nurse Communication Mobility status        Time: 7412-8786 OT Time Calculation (min): 28 min  Charges: OT General Charges $OT Visit: 1 Visit OT Treatments $Self Care/Home Management : 23-37 mins  Delight Stare, OT Acute Rehabilitation Services Pager [phone] Office [phone]    Delight Stare 12/28/2018, 1:35 PM

## 2018-12-28 NOTE — Progress Notes (Signed)
Physical Therapy Treatment Patient Details Name: Tiffany [name] MRN: [id number] DOB: May 30, 1975 Today's Date: 12/28/2018    History of Present Illness Patient is a 43 y/o female who presents with right posterior acetabular fracture s/p ORIF. Plan for postoperative radiation treatment for the prevention of heterotopic ossification. PMH includes but not limited to sickle cell trait, PE, HTN, DVT, depression, fibyomyalgia,anxiety.    PT Comments    Continuing work on functional mobility and activity tolerance;  Nausea is better today, and we worked on progressive ambulation and initiated stair training; provided with information of stair training and surgical hip exercises; On track for dc home tomorrow.   Follow Up Recommendations  Home health PT;Supervision for mobility/OOB;Supervision/Assistance - 24 hour     Equipment Recommendations  Rolling walker with 5" wheels;3in1 (PT)    Recommendations for Other Services       Precautions / Restrictions Precautions Precautions: Fall;Posterior Hip Precaution Booklet Issued: Yes (comment) Precaution Comments: patient able to recall posterior hip precautions without cueing, given increased time   Restrictions Weight Bearing Restrictions: Yes RLE Weight Bearing: Touchdown weight bearing    Mobility  Bed Mobility Overal bed mobility: Needs Assistance Bed Mobility: Supine to Sit     Supine to sit: Min guard;HOB elevated     General bed mobility comments: Increased time and step-by-step cues for technqiue; close guard for hip prec  Transfers Overall transfer level: Needs assistance Equipment used: Rolling walker (2 wheeled) Transfers: Sit to/from Stand Sit to Stand: Supervision         General transfer comment: supervision for safety, cueing for mgmt of R LE and hip precautions; pt slow moving but no phyiscal assist required   Ambulation/Gait Ambulation/Gait assistance: Min guard Gait Distance (Feet): 45 Feet Assistive device:  Rolling walker (2 wheeled) Gait Pattern/deviations: Step-to pattern     General Gait Details: Verbal and demo cues for touchdown weight bearing; adjusted height of RW for better fit   Stairs Stairs: Yes Stairs assistance: Min assist Stair Management: No rails;Step to pattern;Backwards;With walker Number of Stairs: 4 General stair comments: Took time to demonstrate technqiue, then pt ascended/descended 4 steps with this PT stabilizing RW; she tells me her sons will be able to assist her at home   Wheelchair Mobility    Modified Rankin (Stroke Patients Only)       Balance Overall balance assessment: Needs assistance Sitting-balance support: Feet supported;No upper extremity supported Sitting balance-Leahy Scale: Fair     Standing balance support: Bilateral upper extremity supported;During functional activity;No upper extremity supported Standing balance-Leahy Scale: Fair Standing balance comment: relaint on BUE support dynamically, but able to stand statically without UE support briefly                            Cognition Arousal/Alertness: Awake/alert Behavior During Therapy: WFL for tasks assessed/performed Overall Cognitive Status: Within Functional Limits for tasks assessed                                        Exercises      General Comments        Pertinent Vitals/Pain Pain Assessment: 0-10 Pain Score: 9  Faces Pain Scale: Hurts even more Pain Location: right hip, after walking Pain Descriptors / Indicators: Guarding;Sore Pain Intervention(s): Monitored during session;Premedicated before session    Home Living  Prior Function            PT Goals (current goals can now be found in the care plan section) Acute Rehab PT Goals Patient Stated Goal: to be able to get up my stairs to get to my bedroom PT Goal Formulation: With patient Time For Goal Achievement: 01/09/19 Potential to Achieve Goals:  Good Progress towards PT goals: Progressing toward goals    Frequency    Min 4X/week      PT Plan Current plan remains appropriate    Co-evaluation              AM-PAC PT "6 Clicks" Mobility   Outcome Measure  Help needed turning from your back to your side while in a flat bed without using bedrails?: A Little Help needed moving from lying on your back to sitting on the side of a flat bed without using bedrails?: A Little Help needed moving to and from a bed to a chair (including a wheelchair)?: A Little Help needed standing up from a chair using your arms (e.g., wheelchair or bedside chair)?: None Help needed to walk in hospital room?: None Help needed climbing 3-5 steps with a railing? : A Little 6 Click Score: 20    End of Session Equipment Utilized During Treatment: Gait belt Activity Tolerance: Patient tolerated treatment well Patient left: in chair;with call bell/phone within reach Nurse Communication: Mobility status PT Visit Diagnosis: Pain;Difficulty in walking, not elsewhere classified (R26.2) Pain - Right/Left: Right Pain - part of body: Hip     Time: 1012-1100 PT Time Calculation (min) (ACUTE ONLY): 48 min  Charges:  $Gait Training: 23-37 mins $Therapeutic Activity: 8-22 mins                     Roney Marion, PT  Acute Rehabilitation Services Pager [phone] Office [place] 12/28/2018, 2:23 PM

## 2018-12-28 NOTE — Progress Notes (Signed)
Subjective: 3 Days Post-Op Procedure(s) (LRB): OPEN REDUCTION INTERNAL FIXATION (ORIF) ACETABULAR FRACTURE (Right) Patient reports pain as moderate.  Refused PT yesterday due to nausea which she reports today is much improved, pain better controlled with po dilaudid as well.    Objective: Vital signs in last 24 hours: Temp:  [98.4 F (36.9 C)-99 F (37.2 C)] 98.4 F (36.9 C) (09/20 0757) Pulse Rate:  [90-102] 100 (09/20 0415) Resp:  [14-18] 18 (09/20 0415) BP: (93-112)/(60-79) 105/60 (09/20 0415) SpO2:  [92 %-99 %] 99 % (09/20 0757)  Intake/Output from previous day: 09/19 0701 - 09/20 0700 In: 480 [P.O.:480] Out: 250 [Urine:250] Intake/Output this shift: No intake/output data recorded.  Recent Labs    12/25/18 1522 12/25/18 2008 12/25/18 2130 12/26/18 0835 12/27/18 0648  HGB 9.3* 9.2* 9.9* 9.3* 9.2*   Recent Labs    12/26/18 0835 12/27/18 0648  WBC 9.4 10.2  RBC 3.73* 3.71*  HCT 28.8* 29.1*  PLT 425* 371   Recent Labs    12/26/18 0835 12/27/18 0648  NA 136 137  K 4.1 4.1  CL 108 104  CO2 23 25  BUN 5* 6  CREATININE 0.76 0.95  GLUCOSE 103* 105*  CALCIUM 8.5*  8.7 8.4*   Recent Labs    12/25/18 1522  INR 1.1    Neurovascular intact Sensation intact distally Intact pulses distally Dorsiflexion/Plantar flexion intact Incision: dressing C/D/I   Assessment/Plan: 3 Days Post-Op Procedure(s) (LRB): OPEN REDUCTION INTERNAL FIXATION (ORIF) ACETABULAR FRACTURE (Right) Up with therapy Plan for discharge tomorrow TDWB RLE post hip precautions  Made some adjustments with po pain meds to better transition to home will monitor today possible d/c home tomorrow  I would like to try and minimize  IV pain meds today for planned d/c home tomorrow on po only.   Chriss Czar 12/28/2018, 9:51 AM

## 2018-12-29 ENCOUNTER — Other Ambulatory Visit: Payer: Managed Care, Other (non HMO)

## 2018-12-29 LAB — CALCITRIOL (1,25 DI-OH VIT D): Vit D, 1,25-Dihydroxy: 52.1 pg/mL (ref 19.9–79.3)

## 2018-12-29 MED ORDER — VITAMIN C 500 MG PO CAPS: 2.0000 | ORAL_CAPSULE | Freq: Every day | ORAL | 1 refills | Status: DC

## 2018-12-29 MED ORDER — CHOLECALCIFEROL 125 MCG (5000 UT) PO TABS: 1.0000 | ORAL_TABLET | Freq: Every day | ORAL | 3 refills | Status: DC

## 2018-12-29 NOTE — Progress Notes (Signed)
Arrived back to unit to discharge patient and saw family wheeling patient off unit by themselves via wheelchair.  Instructed that patient had already received equipment and discharge instructions per another RN.

## 2018-12-29 NOTE — TOC Transition Note (Signed)
Transition of Care [facility]) - CM/SW Discharge Note Marvetta Gibbons RN,BSN Transitions of Care Unit 4NP coverage - RN Case Manager [phone]   Patient Details  Name: Tiffany [name] MRN: [id number] Date of Birth: 05/26/1975  Transition of Care [facility]) CM/SW Contact:  Dawayne Patricia, RN Phone Number: 12/29/2018, 11:45 AM   Clinical Narrative:    Pt stable for transition home s/p ORIF right hip, per PT/OT recommendations - HHPT/OT orders placed and DME-RW/3n1- CM spoke with pt at bedside for transition of care needs. Pt has [employer] and will need to use [facility] the home healthcare hub that [place] referrals for [facility] to contract with [place] arrangements- explained this process to pt and will made referral on pt's behalf. List provided Per CMS guidelines from medicare.gov website with star ratings (copy placed in shadow chart) for choice- per pt she would prefer [facility]- which will be shared with [facility]. - call made to [facility] with [place] for DME needs- RW and 3n1 to be delivered to room prior to discharge. Call made to [facility]- spoke with Edwin Cap- refegaring HH needs- HHPT/OT referral- intake was done and # provided for intake- [id number] for reference. Per [place]  Pt will be provided with 2 visits per week x 3 weeks (total of 6 visits) for PT and 1 visit per week x 3 weeks (total of 3 visits) for OT initially. (care provided between 9/23 and 10/12). Provided [place] [facility] agency has been secured for [facility] on 9/23. Orders and notes faxed to [facility] via epic to [phone].    Final next level of care: [place] Barriers to Discharge: No Barriers Identified   Patient Goals and CMS Choice Patient states their goals for this hospitalization and ongoing recovery are:: to get stronger and walking CMS Medicare.gov Compare Post Acute Care list provided to:: Patient Choice offered to / list presented to : Patient  Discharge Placement         Home with [facility]                Discharge Plan and Services   Discharge Planning Services: CM Consult Post Acute Care Choice: Home Health, Durable Medical Equipment          DME Arranged: 3-N-1, Walker rolling DME Agency: [employer] Date DME Agency Contacted: 12/29/18 Time DME Agency Contacted: 35 Representative spoke with at DME Agency: Thedore Mins HH Arranged: PT, OT [place] Agency: Other - See comment Date [place]: 12/29/18 Time [place]: 1115 Representative spoke with at [place]: Edwin Cap with [facility]  Social Determinants of Health ([place]) Interventions     Readmission Risk Interventions Readmission Risk Prevention Plan 12/29/2018  Post Dischage Appt Complete  Medication Screening Complete  Transportation Screening Complete  Some recent data might be hidden

## 2018-12-29 NOTE — Progress Notes (Signed)
D/c education given to patient and all questions answered. Equipment delivered to the room. IV removed.

## 2018-12-29 NOTE — Discharge Summary (Signed)
Orthopaedic Trauma Service (OTS) Discharge Summary   Patient ID: Tiffany [name] MRN: [id number] DOB/AGE: 45-Aug-1977 45 y.o.  Admit date: 12/25/2018 Discharge date: 12/29/2018  Admission Diagnoses: Closed right acetabulum fracture, posterior wall Depression Fibromyalgia History of unprovoked DVT lower extremity Sickle cell trait  Discharge Diagnoses:  Principal Problem:   Acetabulum fracture, right ([place]) Active Problems:   Depression   Fibromyalgia   History of DVT of lower extremity   Sickle cell trait (HCC)   Vitamin D deficiency   Past Medical History:  Diagnosis Date  . Abnormal Pap smear 1997   LAST PAP 10/2010  . Allergy    SEASONAL  . Anemia    CHRONIC  . 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 Arthritis   . Blood dyscrasia    SICKLE CELL TRAIT  . Blood transfusion without reported diagnosis 2005   after vaginal delivery  . Breast mass in female 2003  . Broken foot 03/16/2016   right  . Bruises easily   . BV (bacterial vaginosis)   . Cancer ([place])   . Cyst of ovary 2003  . Depression 2003  . DUB (dysfunctional uterine bleeding) 2003  . DVT, lower extremity ([place] 10/2014   left leg  . Fatigue 06/10/2018  . Fibroid    LONG TERM DX  . Fibromyalgia 2004  . Fibrositis   . GERD (gastroesophageal reflux disease) 2007  . H/O amenorrhea 2006  . H/O menorrhagia 2011  . H/O sickle cell trait   . Heart murmur 2007  . History of abnormal cervical Pap smear 2003   had colposcopy and cryo.  Follow up paps are normal.  . Hx: UTI (urinary tract infection) 2003  . Hypertension 2005   RESOLVED WITH WT LOSS  . IBD (inflammatory bowel disease)   . IBS (irritable bowel syndrome)   . Infection    OCC YEAST  . Low back pain 2003  . Menses, irregular 2003  . Migraine   . Nausea and vomiting 06/10/2018  . Neck pain 06/10/2018  . Night sweats 06/10/2018  . Obese 2003  . Pulmonary embolism ([place]) 10/2014  . Sacroiliitis ([place])   . Sickle-cell trait ([place])   . Suspected  Covid-19 Virus Infection 07/28/2018  . Trichomonas   . Uterine leiomyoma   . Vaginitis and vulvovaginitis 2004  . Weight loss 06/10/2018  . Yeast vaginitis 04/24/2010     Procedures Performed: 12/25/2018-Dr. Marcelino [name] OPEN REDUCTION INTERNAL FIXATION (ORIF) ACETABULAR FRACTURE (Right)  Discharged Condition: good  Hospital Course:   Patient is a very pleasant 43 year old black female who was admitted on 12/25/2018 for repair of her right acetabular fracture.  Patient had sustained a fall approximately 2 and half weeks prior to presentation to our office.  She was weed eating and fell down a hill landing on her right hip.  Patient had x-rays performed few days after this fall which were read as negative however she continued to have increasing pain.  She subsequently followed up with her CT scan was performed which did demonstrate posterior wall right acetabular fracture.  Orthopedic trauma service was consulted in an outpatient fashion.  We did review her imaging and we felt that surgical intervention was warranted to maximize her functional recovery.  Patient was in agreement with the plan and was admitted on 12/25/2018 for the procedure noted above.  As this was a subacute fracture we did also arrange for radiation therapy to take place on postoperative day #1 at [facility] long  hospital for the prevention of heterotopic ossification.  Patient tolerated the procedure very well.  After surgery she was transferred to the PACU for recovery from anesthesia and then transferred to the progressive care unit for observation, pain control and therapies.  Again on postoperative day #1 she was transferred to [facility] long hospital for radiation treatment to her right hip for HO prophylaxis  Patient did progress somewhat slowly postoperatively pain was the limiting factor.  She was initially started on oral Norco and was subsequently transitioned to oral Dilaudid which appeared to be more effective.  She will remain on  multimodal analgesia as well with scheduled Tylenol, scheduled Robaxin.  She was covered with Ancef for perioperative antibiotic.  Given her history of unprovoked DVT we felt that an extended treatment course with anticoagulation is warranted.  She was started on Xarelto on postoperative day 1 and will continue on this for 8 weeks postop.  She will continue to mobilize as much as possible and will continue to use TED hose on her right leg as well.  Foley was discontinued on postoperative day #1 as well patient was able to void on postop day #1 without any issues.  She was tolerating regular diet on postoperative day #1 as well  Additionally metabolic bone labs were obtained which did show some vitamin D deficiency.  Patient was started on vitamin D and vitamin C during next day and this will be continued postoperatively.  Recheck vitamin D labs in about 12 weeks.  Patient worked diligently with therapies during her stay.  Ultimately on postoperative day #4 she was deemed to be stable for discharge to home with home health services and necessary DME.  Dressing was changed prior to discharge.  New dressing consisting of 4 x 4 and tape was applied.  She can remove this in 2 days and shower at this time.  Cleaning only with soap and water.  Discharge wound care instructions as well as weightbearing and range of motion restrictions were reviewed with the patient 1 more time and she demonstrates a clear understanding.  All of this is included in her discharge paperwork as well.  Consults: Radiation oncology  Significant Diagnostic Studies: labs:   Results for Tiffany, [name] (MRN [id number]) as of 12/29/2018 09:43  Ref. Range 12/27/2018 06:48  Sodium Latest Ref Range: 135 - 145 mmol/L 137  Potassium Latest Ref Range: 3.5 - 5.1 mmol/L 4.1  Chloride Latest Ref Range: 98 - 111 mmol/L 104  CO2 Latest Ref Range: 22 - 32 mmol/L 25  Glucose Latest Ref Range: 70 - 99 mg/dL 105 (H)  BUN Latest Ref Range: 6 - 20  mg/dL 6  Creatinine Latest Ref Range: 0.44 - 1.00 mg/dL 0.95  Calcium Latest Ref Range: 8.9 - 10.3 mg/dL 8.4 (L)  Anion gap Latest Ref Range: 5 - 15  8  GFR, Est Non African American Latest Ref Range: >60 mL/min >60  GFR, Est African American Latest Ref Range: >60 mL/min >60  WBC Latest Ref Range: 4.0 - 10.5 K/uL 10.2  RBC Latest Ref Range: 3.87 - 5.11 MIL/uL 3.71 (L)  Hemoglobin Latest Ref Range: 12.0 - 15.0 g/dL 9.2 (L)  HCT Latest Ref Range: 36.0 - 46.0 % 29.1 (L)  MCV Latest Ref Range: 80.0 - 100.0 fL 78.4 (L)  MCH Latest Ref Range: 26.0 - 34.0 pg 24.8 (L)  MCHC Latest Ref Range: 30.0 - 36.0 g/dL 31.6  RDW Latest Ref Range: 11.5 - 15.5 % 20.0 (H)  Platelets  Latest Ref Range: 150 - 400 K/uL 371  nRBC Latest Ref Range: 0.0 - 0.2 % 0.0   Results for Tiffany, [name] (MRN [id number]) as of 12/29/2018 09:43  Ref. Range 12/26/2018 08:35  Vitamin D, 25-Hydroxy Latest Ref Range: 30.0 - 100.0 ng/mL 18.2 (L)    Treatments: IV hydration, antibiotics: Ancef, analgesia: acetaminophen and Dilaudid, anticoagulation: xarelto, therapies: PT, OT and RN and surgery: as above  Discharge Exam:  Orthopedic Trauma Service Progress Note   Patient ID: Tiffany [name] MRN: [id number] DOB/AGE: Feb 20, 1976 43 y.o.   Subjective:   Doing well Ready to go home  Pain well controlled with PO dilaudid    Mobilizing much better with therapy     ROS As above   Objective:    VITALS:         Vitals:    12/28/18 2024 12/29/18 0023 12/29/18 0412 12/29/18 0825  BP: 108/76 114/63 112/74 113/71  Pulse: (!) 101 (!) 117 96 97  Resp:       18  Temp: 98.6 F (37 C) 98.8 F (37.1 C) 98.4 F (36.9 C) 98.1 F (36.7 C)  TempSrc: Oral Oral Oral    SpO2: 99% 98% 97% 97%  Weight:          Height:             Estimated body mass index is 32.28 kg/m as calculated from the following:   Height as of this encounter: 5\' 6"  (1.676 m).   Weight as of this encounter: 90.7 kg.     Intake/Output      09/20 0701  - 09/21 0700 09/21 0701 - 09/22 0700   P.O.     Total Intake(mL/kg)     Urine (mL/kg/hr)     Total Output     Net             LABS Results for Tiffany, [name] (MRN [id number]) as of 12/29/2018 09:43   Ref. Range 12/26/2018 08:35  Vitamin D, 25-Hydroxy Latest Ref Range: 30.0 - 100.0 ng/mL 18.2 (L)    PHYSICAL EXAM:    Gen: doing well, NAD, great spirits  Lungs: unlabored Cardiac: regular  Ext:       Right Lower Extremity              Dressing removed             Incision looks great                          Scant serosanguinous drainage from proximal limb of incision                          No erythema                          No signs of infection              Distal motor and sensory functions intact             Ext warm             + DP pulse             No DCT              Compartments are soft             No pain with passive stretch  Swelling well controlled    Assessment/Plan: 4 Days Post-Op    Principal Problem:   Acetabulum fracture, right (HCC) Active Problems:   Depression   Fibromyalgia   History of DVT of lower extremity   Sickle cell trait (HCC)   Vitamin D deficiency               Anti-infectives (From admission, onward)      Start     Dose/Rate Route Frequency Ordered Stop    12/26/18 0600   ceFAZolin (ANCEF) IVPB 2g/100 mL premix     2 g 200 mL/hr over 30 Minutes Intravenous On call to O.R. 12/25/18 1443 12/26/18 0630    12/26/18 0100   ceFAZolin (ANCEF) IVPB 1 g/50 mL premix     1 g 100 mL/hr over 30 Minutes Intravenous Every 6 hours 12/26/18 0024 12/26/18 1300       .   POD/HD#: 25   44 year old female with multiple chronic medical problems s/p fall 2 weeks ago with right acetabulum fracture   -Comminuted right posterior wall acetabular fracture s/p ORIF 12/25/2018             Touchdown weightbearing x8 weeks             Posterior precautions x12 weeks             PT and OT             Dressing changed today                          Change dressings as needed                         Ok to clean wound with soap and water only              Continue with ice for swelling and pain control             TED hose right leg               XRT for heterotopic ossification prophylaxis completed    - Pain management:             Continue with current regimen             Wean IV medications, encourage oral   - ABL anemia/Hemodynamics             Stable   - Medical issues              Chronic medical issues are stable   - DVT/PE prophylaxis:             Given history of unprovoked DVT,  Xarelto 10 mg daily for 8 weeks   - ID:              periop abx completed   - Metabolic Bone Disease:             Metabolic bone labs are processing             Start vitamin D and vitamin C supplementation                         4000-5000 IUs vitamin D3 daily                         1000 mg vitamin C daily - Activity:  Touchdown weightbearing right leg, posterior hip precautions.  Up with assistance using walker or crutches   - FEN/GI prophylaxis/Foley/Lines:             Regular diet             Dc iv and IVF   - Impediments to fracture healing:             Low-energy fracture             Vitamin D deficiency   - Dispo:             dc home today              Follow up with Ortho trauma in 10-14 days    Disposition:    Allergies as of 12/29/2018      Reactions   Flexeril [cyclobenzaprine Hcl] Shortness Of Breath, Itching, Swelling, Rash   Swelling of throat   Latex Shortness Of Breath, Itching, Rash   Lamotrigine Rash   Seasonal Ic  [cholestatin] Other (See Comments)   Other Other (See Comments)   RUBBER      Medication List    STOP taking these medications   betamethasone valerate ointment 0.1 % Commonly known as: VALISONE   levofloxacin 500 MG tablet Commonly known as: LEVAQUIN   magnesium oxide 400 MG tablet Commonly known as: MAG-OX   ondansetron 4 MG tablet Commonly known as:  Zofran   oxyCODONE-acetaminophen 5-325 MG tablet Commonly known as: PERCOCET/ROXICET   Potassium Chloride ER 20 MEQ Tbcr   promethazine 25 MG tablet Commonly known as: PHENERGAN     TAKE these medications   acetaminophen 500 MG tablet Commonly known as: TYLENOL Take 1 tablet (500 mg total) by mouth every 12 (twelve) hours. What changed:   when to take this  reasons to take this   amphetamine-dextroamphetamine 30 MG tablet Commonly known as: ADDERALL Take 30 mg by mouth 2 (two) times daily. Morning and noon   Cholecalciferol 125 MCG (5000 UT) Tabs Take 1 tablet (5,000 Units total) by mouth daily.   desloratadine 5 MG tablet Commonly known as: CLARINEX Take 5 mg by mouth daily.   docusate sodium 100 MG capsule Commonly known as: COLACE Take 1 capsule (100 mg total) by mouth 2 (two) times daily.   fluticasone 50 MCG/ACT nasal spray Commonly known as: FLONASE Place 1 spray into both nostrils daily as needed for allergies.   HYDROmorphone 2 MG tablet Commonly known as: Dilaudid Take 1 tablet (2 mg total) by mouth every 4 (four) hours as needed for severe pain (may need 1-2 on occasion the first couple weeks following surgery).   linaclotide 145 MCG Caps capsule Commonly known as: LINZESS Take 145 mcg by mouth as needed.   lubiprostone 24 MCG capsule Commonly known as: Amitiza Take 1 capsule (24 mcg total) by mouth daily with breakfast. What changed: additional instructions   methocarbamol 500 MG tablet Commonly known as: ROBAXIN Take 1-2 tablets (500-1,000 mg total) by mouth every 6 (six) hours as needed for muscle spasms.   nystatin cream Commonly known as: MYCOSTATIN Apply 1 application topically 2 (two) times daily. Apply to affected area BID for up to 7 days.   pantoprazole 40 MG tablet Commonly known as: PROTONIX Take 40 mg by mouth daily.   rivaroxaban 10 MG Tabs tablet Commonly known as: XARELTO Take 1 tablet (10 mg total) by mouth daily.    Spravato (56 MG Dose) 28 MG/DEVICE Sopk Generic drug: Esketamine HCl (  56 MG Dose) Place 2 sprays into the nose every 14 (fourteen) days.   Vitamin C 500 MG Caps Take 2 capsules by mouth daily.   Xanax 1 MG tablet Generic drug: ALPRAZolam Take 1 mg by mouth at bedtime as needed for anxiety.   zolpidem 5 MG tablet Commonly known as: AMBIEN Take 5 mg by mouth at bedtime.      Follow-up Information    Altamese Redland, MD. Schedule an appointment as soon as possible for a visit on 01/07/2019.   Specialty: Orthopedic Surgery Contact information: [place] [address] [phone]           Discharge Instructions and Plan:  Patient has sustained a significant injury to her right acetabulum but we were able to achieve excellent fixation with formal plate osteosynthesis.  We are hopeful that she will be able to heal uneventfully and have a full functional recovery.  She is at an increased risk for the development of heterotopic ossification given her subacute presentation.  However we are hopeful that this will be mitigated with radiation therapy that she is received.  Patient will be touchdown weightbearing for 8 weeks with posterior hip precautions for 12 weeks.  She will continue to work with therapies on a regular basis and continue with a home exercise program.  We want her to be as mobile as possible while maintaining her weightbearing and range of motion restrictions.  She will remain on Xarelto for 8 weeks for DVT and PE prophylaxis given her history of unprovoked DVT of the lower extremity.  Wound care was reviewed with the patient.  She can change the dressings on 12/31/2018.  Once they are dry she can leave them open to air.  It is okay for her to start cleaning them at this point with soap and water.  She has been advised to not put any lotions ointments or other solutions on her wounds and only use soap and water.  Patient will follow-up at our office in  10 to 14 days for follow-up x-rays and suture removal.  We would anticipate initiation of outpatient physical therapy once she begins weightbearing activities which would be in about another 7 weeks  Signed:  Jari Pigg, PA-C [phone] (C) 12/29/2018, 9:49 AM  [facility] [place] [id number] [phone] Domingo Sep (F)

## 2018-12-29 NOTE — Anesthesia Postprocedure Evaluation (Signed)
Anesthesia Post Note  Patient: Tiffany [name]  Procedure(s) Performed: OPEN REDUCTION INTERNAL FIXATION (ORIF) ACETABULAR FRACTURE (Right Hip)     Patient location during evaluation: PACU Anesthesia Type: General Level of consciousness: awake and alert Pain management: pain level controlled Vital Signs Assessment: post-procedure vital signs reviewed and stable Respiratory status: spontaneous breathing, nonlabored ventilation, respiratory function stable and patient connected to nasal cannula oxygen Cardiovascular status: blood pressure returned to baseline and stable Postop Assessment: no apparent nausea or vomiting Anesthetic complications: no    Last Vitals:  Vitals:   12/29/18 0023 12/29/18 0412  BP: 114/63 112/74  Pulse: (!) 117 96  Resp:    Temp: 37.1 C 36.9 C  SpO2: 98% 97%    Last Pain:  Vitals:   12/29/18 0412  TempSrc: Oral  PainSc:                  [place]

## 2018-12-29 NOTE — Plan of Care (Signed)

## 2018-12-29 NOTE — Progress Notes (Signed)
Orthopedic Trauma Service Progress Note  Patient ID: Tiffany [name] MRN: [id number] DOB/AGE: October 31, 1975 43 y.o.  Subjective:  Doing well Ready to go home  Pain well controlled with PO dilaudid   Mobilizing much better with therapy   ROS As above  Objective:   VITALS:   Vitals:   12/28/18 2024 12/29/18 0023 12/29/18 0412 12/29/18 0825  BP: 108/76 114/63 112/74 113/71  Pulse: (!) 101 (!) 117 96 97  Resp:    18  Temp: 98.6 F (37 C) 98.8 F (37.1 C) 98.4 F (36.9 C) 98.1 F (36.7 C)  TempSrc: Oral Oral Oral   SpO2: 99% 98% 97% 97%  Weight:      Height:        Estimated body mass index is 32.28 kg/m as calculated from the following:   Height as of this encounter: 5\' 6"  (1.676 m).   Weight as of this encounter: 90.7 kg.   Intake/Output      09/20 0701 - 09/21 0700 09/21 0701 - 09/22 0700   P.O.     Total Intake(mL/kg)     Urine (mL/kg/hr)     Total Output     Net            LABS Results for Tiffany [name], Tiffany [name] (MRN [id number]) as of 12/29/2018 09:43  Ref. Range 12/26/2018 08:35  Vitamin D, 25-Hydroxy Latest Ref Range: 30.0 - 100.0 ng/mL 18.2 (L)   PHYSICAL EXAM:   Gen: doing well, NAD, great spirits  Lungs: unlabored Cardiac: regular  Ext:       Right Lower Extremity   Dressing removed  Incision looks great    Scant serosanguinous drainage from proximal limb of incision    No erythema    No signs of infection   Distal motor and sensory functions intact  Ext warm  + DP pulse  No DCT   Compartments are soft  No pain with passive stretch   Swelling well controlled   Assessment/Plan: 4 Days Post-Op   Principal Problem:   Acetabulum fracture, right (HCC) Active Problems:   Depression   Fibromyalgia   History of DVT of lower extremity   Sickle cell trait (HCC)   Vitamin D deficiency   Anti-infectives (From admission, onward)   Start     Dose/Rate Route Frequency Ordered  Stop   12/26/18 0600  ceFAZolin (ANCEF) IVPB 2g/100 mL premix     2 g 200 mL/hr over 30 Minutes Intravenous On call to O.R. 12/25/18 1443 12/26/18 0630   12/26/18 0100  ceFAZolin (ANCEF) IVPB 1 g/50 mL premix     1 g 100 mL/hr over 30 Minutes Intravenous Every 6 hours 12/26/18 0024 12/26/18 1300    .  POD/HD#: 58  43 year old female with multiple chronic medical problems s/p fall 2 weeks ago with right acetabulum fracture   -Comminuted right posterior wall acetabular fracture s/p ORIF 12/25/2018             Touchdown weightbearing x8 weeks             Posterior precautions x12 weeks             PT and OT             Dressing changed today   Change dressings as needed   Ok to  clean wound with soap and water only              Continue with ice for swelling and pain control             TED hose right leg               XRT for heterotopic ossification prophylaxis completed    - Pain management:             Continue with current regimen             Wean IV medications, encourage oral   - ABL anemia/Hemodynamics             Stable   - Medical issues              Chronic medical issues are stable   - DVT/PE prophylaxis:             Given history of unprovoked DVT,  Xarelto 10 mg daily for 8 weeks   - ID:              periop abx completed   - Metabolic Bone Disease:             Metabolic bone labs are processing             Start vitamin D and vitamin C supplementation                         4000-5000 IUs vitamin D3 daily                         1000 mg vitamin C daily - Activity:             Touchdown weightbearing right leg, posterior hip precautions.  Up with assistance using walker or crutches   - FEN/GI prophylaxis/Foley/Lines:             Regular diet  Dc iv and IVF   - Impediments to fracture healing:             Low-energy fracture             Vitamin D deficiency   - Dispo:             dc home today   Follow up with Ortho trauma in 10-14 days     Jari Pigg, PA-C [phone] (C) 12/29/2018, 9:40 AM  [facility] [place] [address] [phone] Domingo Sep (F)

## 2018-12-29 NOTE — Progress Notes (Signed)
Physical Therapy Treatment Patient Details Name: Tiffany [name] MRN: [id number] DOB: 10/02/1975 Today's Date: 12/29/2018    History of Present Illness Patient is a 43 y/o female who presents with right posterior acetabular fracture s/p ORIF. Plan for postoperative radiation treatment for the prevention of heterotopic ossification. PMH includes but not limited to sickle cell trait, PE, HTN, DVT, depression, fibyomyalgia,anxiety.    PT Comments    Pt received in bed, agreeable to participation in therapy. Min guard assist bed mobility, supervision transfers, and min guard assist ambulation 45' with RW. Reviewed hip precautions, stairs and HEP. Pt verbalizes understanding. Pt assisted in bathroom and then back to bed.     Follow Up Recommendations  Home health PT;Supervision for mobility/OOB;Supervision/Assistance - 24 hour     Equipment Recommendations  Rolling walker with 5" wheels;3in1 (PT)    Recommendations for Other Services       Precautions / Restrictions Precautions Precautions: Fall;Posterior Hip Precaution Comments: Pt independently recalled 3/3 hip precautions. Restrictions RLE Weight Bearing: Touchdown weight bearing    Mobility  Bed Mobility   Bed Mobility: Supine to Sit;Sit to Supine     Supine to sit: HOB elevated;Min guard Sit to supine: Min guard;HOB elevated   General bed mobility comments: +rail, increased time and effort, cues for hip precautions  Transfers Overall transfer level: Needs assistance Equipment used: Rolling walker (2 wheeled) Transfers: Sit to/from [facility] Sit to Stand: Supervision Stand pivot transfers: Supervision       General transfer comment: supervision for safety, pt demo good technique.  Ambulation/Gait Ambulation/Gait assistance: Min guard Gait Distance (Feet): 75 Feet Assistive device: Rolling walker (2 wheeled) Gait Pattern/deviations: Step-to pattern;Antalgic;Decreased stride length Gait velocity:  decreased Gait velocity interpretation: <1.31 ft/sec, indicative of household ambulator General Gait Details: cues for RW management and sequencing   Stairs         General stair comments: Verbally reviewed stairs. Pt reports understanding of technique and no concerns.   Wheelchair Mobility    Modified Rankin (Stroke Patients Only)       Balance Overall balance assessment: Needs assistance Sitting-balance support: Feet supported;No upper extremity supported Sitting balance-Leahy Scale: Good     Standing balance support: Bilateral upper extremity supported;During functional activity;No upper extremity supported Standing balance-Leahy Scale: Fair Standing balance comment: static stand without support, RW for amb                            Cognition Arousal/Alertness: Awake/alert Behavior During Therapy: WFL for tasks assessed/performed Overall Cognitive Status: Within Functional Limits for tasks assessed                                        Exercises Total Joint Exercises Ankle Circles/Pumps: AROM;Both;20 reps;Supine    General Comments        Pertinent Vitals/Pain Pain Assessment: 0-10 Pain Score: 8  Pain Location: R hip during mobility Pain Descriptors / Indicators: Grimacing;Guarding;Sore Pain Intervention(s): Monitored during session;Repositioned    Home Living                      Prior Function            PT Goals (current goals can now be found in the care plan section) Acute Rehab PT Goals Patient Stated Goal: home today PT Goal Formulation: With patient Time For  Goal Achievement: 01/09/19 Potential to Achieve Goals: Good Progress towards PT goals: Progressing toward goals    Frequency    Min 4X/week      PT Plan Current plan remains appropriate    Co-evaluation              AM-PAC PT "6 Clicks" Mobility   Outcome Measure  Help needed turning from your back to your side while in a flat  bed without using bedrails?: None Help needed moving from lying on your back to sitting on the side of a flat bed without using bedrails?: A Little Help needed moving to and from a bed to a chair (including a wheelchair)?: A Little Help needed standing up from a chair using your arms (e.g., wheelchair or bedside chair)?: None Help needed to walk in hospital room?: None Help needed climbing 3-5 steps with a railing? : A Little 6 Click Score: 21    End of Session Equipment Utilized During Treatment: Gait belt Activity Tolerance: Patient tolerated treatment well Patient left: in bed;with call bell/phone within reach Nurse Communication: Mobility status PT Visit Diagnosis: Pain;Difficulty in walking, not elsewhere classified (R26.2) Pain - Right/Left: Right Pain - part of body: Hip     Time: [id number] PT Time Calculation (min) (ACUTE ONLY): 20 min  Charges:  $Gait Training: 8-22 mins                     Lorrin Goodell, PT  Office # [phone] Pager [phone]    Lorriane Shire 12/29/2018, 9:47 AM

## 2018-12-29 NOTE — Progress Notes (Signed)
Patient wants antibiotic cream for skin tear on chest. Patient has been getting dilaudid PO and has not complained about excessive pain tonight.

## 2019-01-17 IMAGING — MG DIGITAL SCREENING BILATERAL MAMMOGRAM WITH IMPLANTS, CAD AND TOM
8 of 12 series · 8 of 28 positions shown · non-contrast
Comparison: Previous exam(s).

CLINICAL DATA: Screening.

EXAM:
DIGITAL SCREENING BILATERAL MAMMOGRAM WITH IMPLANTS, CAD AND TOMO
The patient has retropectoral implants. Standard and implant
displaced views were performed.

[L CC]
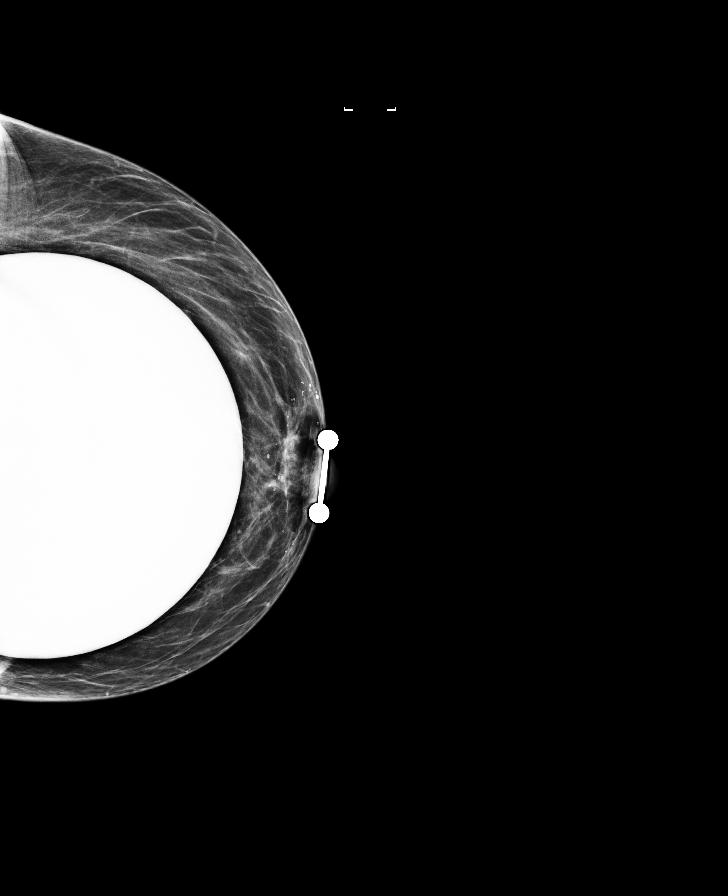

[R CC]
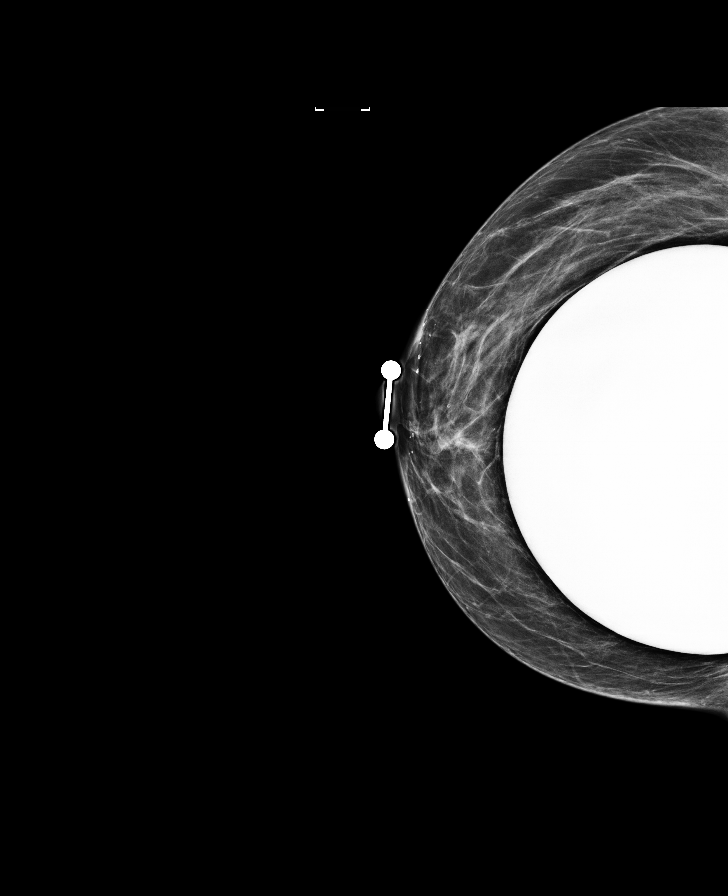

[L MLO]
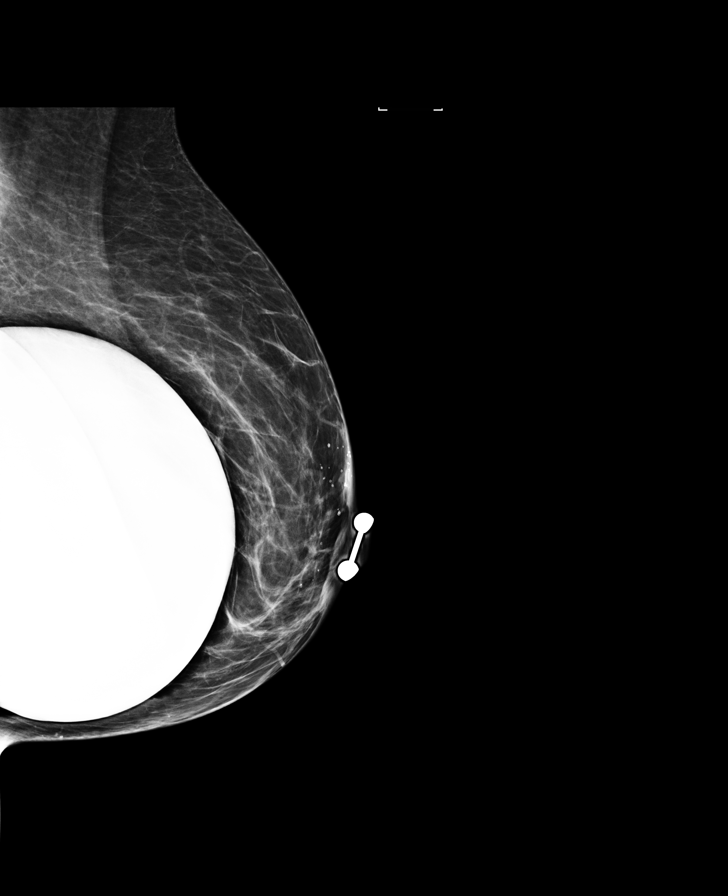

[R MLO]
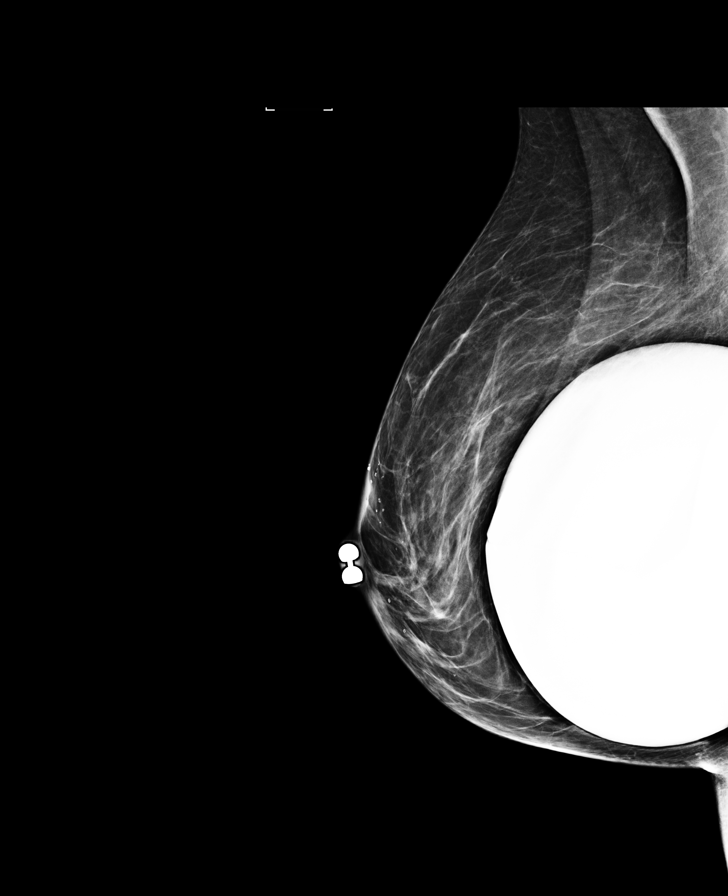

[L MLO synth-2D]
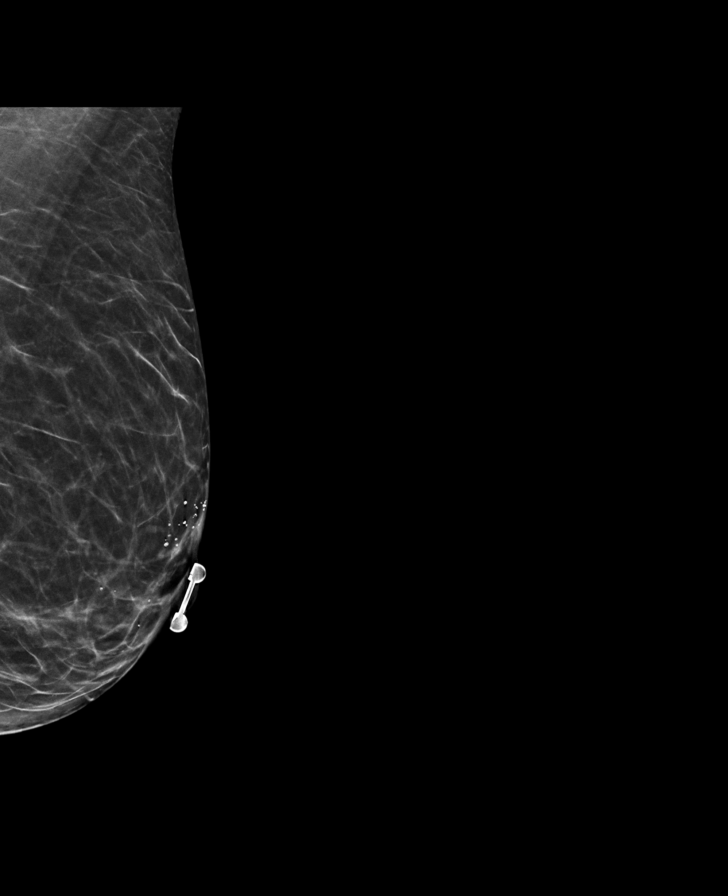

[R CC synth-2D]
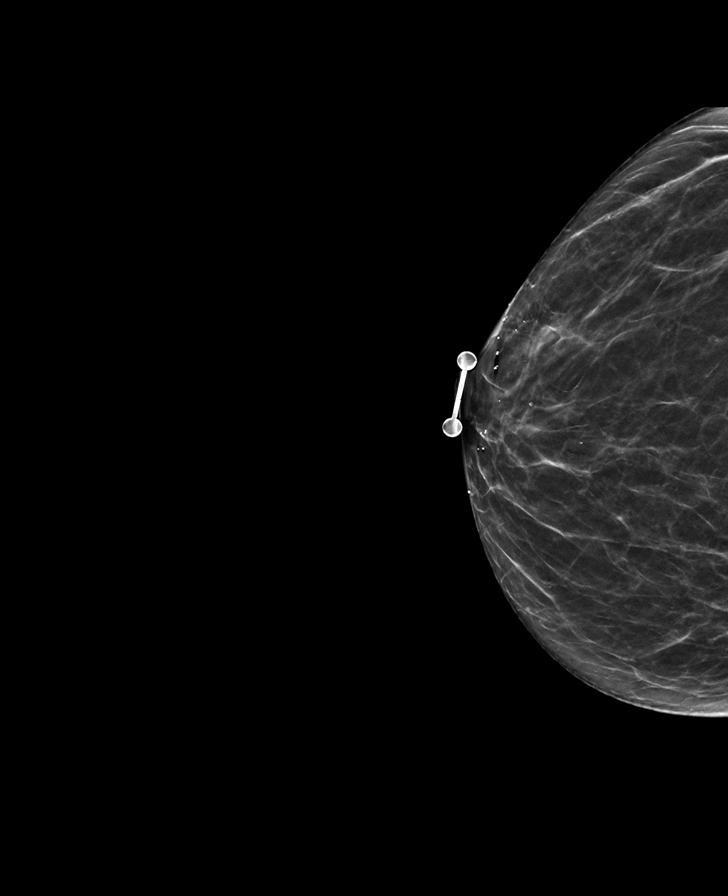

[L CC synth-2D]
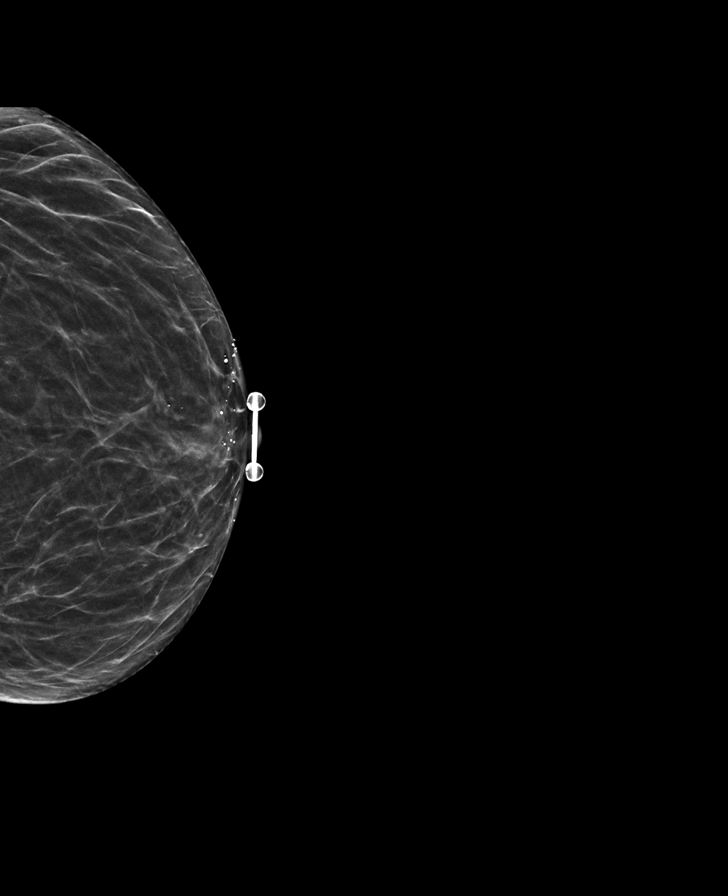

[R MLO synth-2D]
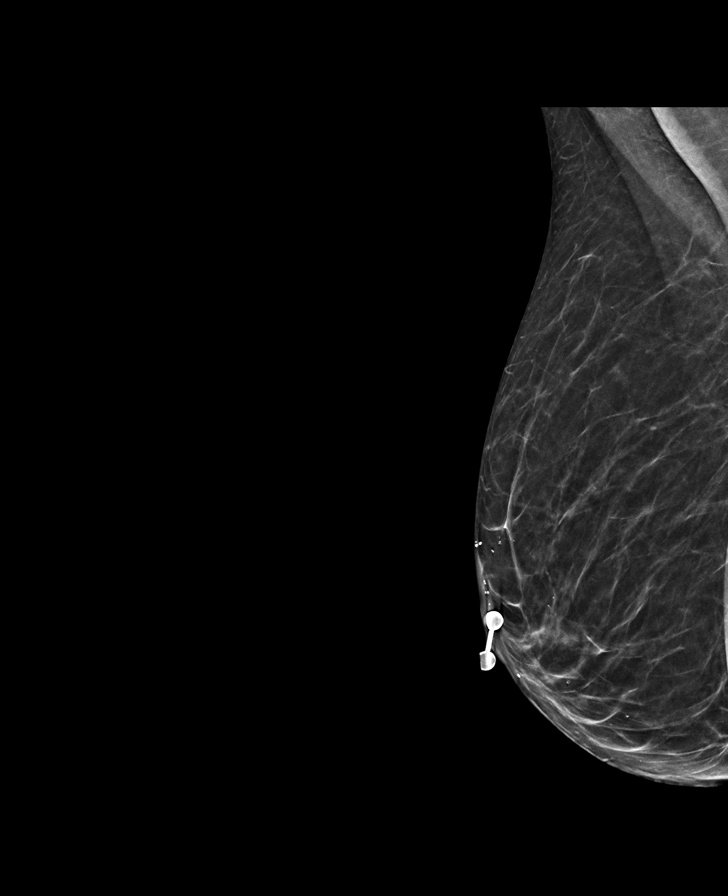

[8 of 28 positions shown; findings below may reference images not displayed]

ACR Breast Density Category b: There are scattered areas of
fibroglandular density.
FINDINGS: There are no findings suspicious for malignancy. Images were
processed with CAD.
IMPRESSION: No mammographic evidence of malignancy. A result letter of this
screening mammogram will be mailed directly to the patient.

RECOMMENDATION:
Screening mammogram in one year. (Code:[id number])

BI-RADS CATEGORY  1:  Negative.

## 2019-01-19 ENCOUNTER — Other Ambulatory Visit: Payer: Self-pay

## 2019-01-19 DIAGNOSIS — Z20822 Contact with and (suspected) exposure to covid-19: Secondary | ICD-10-CM

## 2019-01-21 LAB — NOVEL CORONAVIRUS, NAA: SARS-CoV-2, NAA: DETECTED — AB

## 2019-01-28 ENCOUNTER — Telehealth: Payer: Self-pay | Admitting: *Deleted

## 2019-01-28 NOTE — Telephone Encounter (Signed)
Returned call to patient regarding questions about recent COVID test results. No answer at this time. Left message on voicemail for pt to return call.

## 2019-02-18 ENCOUNTER — Other Ambulatory Visit: Payer: Self-pay

## 2019-02-18 ENCOUNTER — Other Ambulatory Visit: Payer: Self-pay | Admitting: Orthopedic Surgery

## 2019-02-18 DIAGNOSIS — Z13820 Encounter for screening for osteoporosis: Secondary | ICD-10-CM

## 2019-02-20 ENCOUNTER — Other Ambulatory Visit (HOSPITAL_BASED_OUTPATIENT_CLINIC_OR_DEPARTMENT_OTHER): Payer: Self-pay | Admitting: Obstetrics and Gynecology

## 2019-02-20 ENCOUNTER — Ambulatory Visit: Payer: Managed Care, Other (non HMO) | Admitting: Certified Nurse Midwife

## 2019-02-20 ENCOUNTER — Encounter: Payer: Self-pay | Admitting: Obstetrics and Gynecology

## 2019-02-20 DIAGNOSIS — Z1231 Encounter for screening mammogram for malignant neoplasm of breast: Secondary | ICD-10-CM

## 2019-02-25 NOTE — Progress Notes (Signed)
  Radiation Oncology         [phone] ________________________________  Name: Tiffany [name] MRN: [id number]  Date: 12/26/2018  DOB: 1975-06-05  End of Treatment Note  Diagnosis:   Postoperative heterotopic ossification     Indication for treatment:  curative       Radiation treatment dates:   12/26/18  Site/dose:   The patient was treated to a dose of 7 Gy to the right hip. This was accomplished with AP and PA fields.  Narrative: The patient tolerated radiation treatment relatively well.   No difficulties.  Plan: The patient has completed radiation treatment. The patient will return to radiation oncology clinic on an as needed basis. ________________________________  Jodelle Gross, M.D., Ph.D.

## 2019-02-26 ENCOUNTER — Other Ambulatory Visit (HOSPITAL_BASED_OUTPATIENT_CLINIC_OR_DEPARTMENT_OTHER): Payer: Self-pay | Admitting: Obstetrics and Gynecology

## 2019-02-26 ENCOUNTER — Other Ambulatory Visit: Payer: Self-pay

## 2019-02-26 ENCOUNTER — Ambulatory Visit: Payer: Managed Care, Other (non HMO)

## 2019-02-26 DIAGNOSIS — Z1231 Encounter for screening mammogram for malignant neoplasm of breast: Secondary | ICD-10-CM

## 2019-02-27 ENCOUNTER — Encounter (HOSPITAL_BASED_OUTPATIENT_CLINIC_OR_DEPARTMENT_OTHER): Payer: Managed Care, Other (non HMO)

## 2019-03-02 ENCOUNTER — Ambulatory Visit: Payer: Managed Care, Other (non HMO) | Admitting: Certified Nurse Midwife

## 2019-03-02 ENCOUNTER — Telehealth: Payer: Self-pay | Admitting: Certified Nurse Midwife

## 2019-03-02 ENCOUNTER — Other Ambulatory Visit: Payer: Self-pay | Admitting: Physician Assistant

## 2019-03-02 DIAGNOSIS — Z1231 Encounter for screening mammogram for malignant neoplasm of breast: Secondary | ICD-10-CM

## 2019-03-02 NOTE — Progress Notes (Deleted)
43 y.o. [id number] Single  {Race/ethnicity:17218} Fe here for annual exam.    No LMP recorded. (Menstrual status: Irregular Periods).          Sexually active: {yes no:314532}  The current method of family planning is {contraception:315051}.    Exercising: {yes no:314532}  {types:19826} Smoker:  {YES NO:22349}  ROS  Health Maintenance: Pap:  02-18-15 neg HPV HR neg History of Abnormal Pap: yes, cryo MMG:  01-15-18 category b density birads 1:neg Self Breast exams: {YES NO:22349} Colonoscopy:  2017 polyps BMD:   none TDaP:  2017 Shingles: no Pneumonia: no Hep C and HIV: both neg 2019 Labs: ***   reports that she has never smoked. She has never used smokeless tobacco. She reports current alcohol use of about 1.0 standard drinks of alcohol per week. She reports that she does not use drugs.  Past Medical History:  Diagnosis Date  . Abnormal Pap smear 1997   LAST PAP 10/2010  . Allergy    SEASONAL  . Anemia    CHRONIC  . 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 Arthritis   . Blood dyscrasia    SICKLE CELL TRAIT  . Blood transfusion without reported diagnosis 2005   after vaginal delivery  . Breast mass in female 2003  . Broken foot 03/16/2016   right  . Bruises easily   . BV (bacterial vaginosis)   . Cancer ([place])   . Cyst of ovary 2003  . Depression 2003  . DUB (dysfunctional uterine bleeding) 2003  . DVT, lower extremity ([place]) 10/2014   left leg  . Fatigue 06/10/2018  . Fibroid    LONG TERM DX  . Fibromyalgia 2004  . Fibrositis   . GERD (gastroesophageal reflux disease) 2007  . H/O amenorrhea 2006  . H/O menorrhagia 2011  . H/O sickle cell trait   . Heart murmur 2007  . History of abnormal cervical Pap smear 2003   had colposcopy and cryo.  Follow up paps are normal.  . Hx: UTI (urinary tract infection) 2003  . Hypertension 2005   RESOLVED WITH WT LOSS  . IBD (inflammatory bowel disease)   . IBS (irritable bowel syndrome)   . Infection    OCC YEAST  . Low back  pain 2003  . Menses, irregular 2003  . Migraine   . Nausea and vomiting 06/10/2018  . Neck pain 06/10/2018  . Night sweats 06/10/2018  . Obese 2003  . Pulmonary embolism ([place]) 10/2014  . Sacroiliitis ([place])   . Sickle-cell trait ([place])   . Suspected Covid-19 Virus Infection 07/28/2018  . Trichomonas   . Uterine leiomyoma   . Vaginitis and vulvovaginitis 2004  . Weight loss 06/10/2018  . Yeast vaginitis 04/24/2010    Past Surgical History:  Procedure Laterality Date  . AUGMENTATION MAMMAPLASTY  [id number]   Silicone implants  . broken ankle Right   . CERVICAL CONE BIOPSY  1997  . COSMETIC SURGERY  2012   thigh lift  . CYSTECTOMY Right    obtain date from patient, was not on history form dated 06/16/10--benign cyst removed Rt.ovary  . DILATION AND CURETTAGE OF UTERUS    . LAPAROSCOPIC GASTRIC BANDING  05/20/2007  . ORIF ACETABULAR FRACTURE Right 12/25/2018   Procedure: OPEN REDUCTION INTERNAL FIXATION (ORIF) ACETABULAR FRACTURE;  Surgeon: Altamese Plainview, MD;  Location: [facility];  Service: Orthopedics;  Laterality: Right;  . WISDOM TOOTH EXTRACTION      Current Outpatient Medications  Medication Sig Dispense Refill  .  acetaminophen (TYLENOL) 500 MG tablet Take 1 tablet (500 mg total) by mouth every 12 (twelve) hours. 30 tablet 0  . ALPRAZolam (XANAX) 1 MG tablet Take 1 mg by mouth at bedtime as needed for anxiety.    [name] amphetamine-dextroamphetamine (ADDERALL) 30 MG tablet Take 30 mg by mouth 2 (two) times daily. Morning and noon    . Ascorbic Acid (VITAMIN C) 500 MG CAPS Take 2 capsules by mouth daily. 60 capsule 1  . Cholecalciferol 125 MCG (5000 UT) TABS Take 1 tablet (5,000 Units total) by mouth daily. 30 tablet 3  . desloratadine (CLARINEX) 5 MG tablet Take 5 mg by mouth daily.    [name] docusate sodium (COLACE) 100 MG capsule Take 1 capsule (100 mg total) by mouth 2 (two) times daily. 30 capsule 0  . fluticasone (FLONASE) 50 MCG/ACT nasal spray Place 1 spray into both nostrils daily as needed for  allergies.     [name] HYDROmorphone (DILAUDID) 2 MG tablet Take 1 tablet (2 mg total) by mouth every 4 (four) hours as needed for severe pain (may need 1-2 on occasion the first couple weeks following surgery). 40 tablet 0  . linaclotide (LINZESS) 145 MCG CAPS capsule Take 145 mcg by mouth as needed.    . lubiprostone (AMITIZA) 24 MCG capsule Take 1 capsule (24 mcg total) by mouth daily with breakfast. (Patient taking differently: Take 24 mcg by mouth daily with breakfast. As needed) 30 capsule 5  . methocarbamol (ROBAXIN) 500 MG tablet Take 1-2 tablets (500-1,000 mg total) by mouth every 6 (six) hours as needed for muscle spasms. 60 tablet 0  . nystatin cream (MYCOSTATIN) Apply 1 application topically 2 (two) times daily. Apply to affected area BID for up to 7 days. (Patient not taking: Reported on 12/25/2018) 30 g 0  . pantoprazole (PROTONIX) 40 MG tablet Take 40 mg by mouth daily.    . rivaroxaban (XARELTO) 10 MG TABS tablet Take 1 tablet (10 mg total) by mouth daily. 30 tablet 1  . SPRAVATO, 56 MG DOSE, 28 MG/DEVICE SOPK Place 2 sprays into the nose every 14 (fourteen) days.    [name] zolpidem (AMBIEN) 5 MG tablet Take 5 mg by mouth at bedtime.      No current facility-administered medications for this visit.     Family History  Problem Relation Age of Onset  . Diabetes Mother   . Arthritis Mother   . Hyperlipidemia Mother   . Other Mother        VARICOSE VEINS; DECREASED HEART RATE; +Hysterectomy for unspecified reason  . Fibromyalgia Mother   . Colon polyps Mother        unspecified number  . Depression Mother   . Dementia Mother   . Heart disease Father   . Hypertension Father   . Alcohol abuse Father   . Drug abuse Father   . Stroke Father   . Ovarian cancer Sister        dx. <43y  . Cervical cancer Sister 21  . Multiple sclerosis Brother   . Thyroid disease Brother        hypothyroidism  . Lung cancer Maternal Grandmother 74       secondhand smoke  . Depression Maternal  Grandmother   . Miscarriages / Stillbirths Maternal Grandmother   . Other Maternal Grandmother        binswanger's disease  . Birth defects Maternal Aunt        EXTRA DIGITS; MUSCULAR DISEASE  . Early death Maternal Aunt  11  . COPD Maternal Grandfather   . Stroke Maternal Grandfather   . Ovarian cancer Maternal Aunt 36       Dec.age 17 Ovarian CA  . Early death Maternal Aunt        d. three days after birth; unknown cause  . Breast cancer Maternal Aunt 52  . Colon polyps Maternal Aunt        unspecified number  . Liver disease Paternal Aunt   . Ovarian cancer Maternal Aunt 28  . Depression Maternal Aunt   . Heart attack Paternal Uncle        in mid-50s  . Alcohol abuse Maternal Uncle   . Breast cancer Cousin        maternal    ROS:  Pertinent items are noted in HPI.  Otherwise, a comprehensive ROS was negative.  Exam:   There were no vitals taken for this visit.   Ht Readings from Last 3 Encounters:  12/25/18 5\' 6"  (1.676 m)  07/03/18 5\' 6"  (1.676 m)  06/22/18 5\' 6"  (1.676 m)    General appearance: alert, cooperative and appears stated age Head: Normocephalic, without obvious abnormality, atraumatic Neck: no adenopathy, supple, symmetrical, trachea midline and thyroid {EXAM; THYROID:18604} Lungs: clear to auscultation bilaterally Breasts: {Exam; breast:13139::"normal appearance, no masses or tenderness"} Heart: regular rate and rhythm Abdomen: soft, non-tender; no masses,  no organomegaly Extremities: extremities normal, atraumatic, no cyanosis or edema Skin: Skin color, texture, turgor normal. No rashes or lesions Lymph nodes: Cervical, supraclavicular, and axillary nodes normal. No abnormal inguinal nodes palpated Neurologic: Grossly normal   Pelvic: External genitalia:  no lesions              Urethra:  normal appearing urethra with no masses, tenderness or lesions              Bartholin's and Skene's: normal                 Vagina: normal appearing vagina with  normal color and discharge, no lesions              Cervix: {exam; cervix:14595}              Pap taken: {yes no:314532} Bimanual Exam:  Uterus:  {exam; uterus:12215}              Adnexa: {exam; adnexa:12223}               Rectovaginal: Confirms               Anus:  normal sphincter tone, no lesions  Chaperone present: ***  A:  Well Woman with normal exam  P:   Reviewed health and wellness pertinent to exam  Pap smear: {YES NO:22349}  {plan; gyn:5269::"mammogram","pap smear","return annually or prn"}  An After Visit Summary was printed and given to the patient.

## 2019-03-02 NOTE — Telephone Encounter (Signed)
Patient cancel/rescheduled this afternoon's appointment due to heavy cycle. Rescheduled for 03/10/19 with Dr. Talbert Nan per patient request due to her schedule.

## 2019-03-04 ENCOUNTER — Other Ambulatory Visit: Payer: Self-pay

## 2019-03-04 ENCOUNTER — Ambulatory Visit
Admission: RE | Admit: 2019-03-04 | Discharge: 2019-03-04 | Disposition: A | Discharge status: Home / Self Care | Payer: Managed Care, Other (non HMO) | Source: Ambulatory Visit

## 2019-03-04 DIAGNOSIS — Z1231 Encounter for screening mammogram for malignant neoplasm of breast: Secondary | ICD-10-CM

## 2019-03-09 ENCOUNTER — Other Ambulatory Visit: Payer: Self-pay | Admitting: Physician Assistant

## 2019-03-09 ENCOUNTER — Other Ambulatory Visit: Payer: Self-pay

## 2019-03-09 DIAGNOSIS — R928 Other abnormal and inconclusive findings on diagnostic imaging of breast: Secondary | ICD-10-CM

## 2019-03-10 ENCOUNTER — Ambulatory Visit (INDEPENDENT_AMBULATORY_CARE_PROVIDER_SITE_OTHER): Payer: Managed Care, Other (non HMO) | Admitting: Obstetrics and Gynecology

## 2019-03-10 ENCOUNTER — Other Ambulatory Visit: Payer: Self-pay | Admitting: Pediatrics

## 2019-03-10 ENCOUNTER — Encounter: Payer: Self-pay | Admitting: Obstetrics and Gynecology

## 2019-03-10 ENCOUNTER — Other Ambulatory Visit (HOSPITAL_COMMUNITY)
Admission: RE | Admit: 2019-03-10 | Discharge: 2019-03-10 | Disposition: A | Discharge status: Home / Self Care | Payer: Managed Care, Other (non HMO) | Source: Ambulatory Visit | Attending: Obstetrics and Gynecology | Admitting: Obstetrics and Gynecology

## 2019-03-10 ENCOUNTER — Other Ambulatory Visit: Payer: Self-pay | Admitting: Obstetrics and Gynecology

## 2019-03-10 VITALS — BP 136/84 | HR 80 | Temp 97.2°F | Ht 65.5 in | Wt 201.4 lb

## 2019-03-10 DIAGNOSIS — Z862 Personal history of diseases of the blood and blood-forming organs and certain disorders involving the immune mechanism: Secondary | ICD-10-CM

## 2019-03-10 DIAGNOSIS — Z8639 Personal history of other endocrine, nutritional and metabolic disease: Secondary | ICD-10-CM

## 2019-03-10 DIAGNOSIS — Z124 Encounter for screening for malignant neoplasm of cervix: Secondary | ICD-10-CM | POA: Insufficient documentation

## 2019-03-10 DIAGNOSIS — N920 Excessive and frequent menstruation with regular cycle: Secondary | ICD-10-CM | POA: Diagnosis not present

## 2019-03-10 DIAGNOSIS — E54 Ascorbic acid deficiency: Secondary | ICD-10-CM

## 2019-03-10 DIAGNOSIS — Z8041 Family history of malignant neoplasm of ovary: Secondary | ICD-10-CM

## 2019-03-10 DIAGNOSIS — R928 Other abnormal and inconclusive findings on diagnostic imaging of breast: Secondary | ICD-10-CM

## 2019-03-10 DIAGNOSIS — Z800 Family history of malignant neoplasm of digestive organs: Secondary | ICD-10-CM

## 2019-03-10 DIAGNOSIS — N898 Other specified noninflammatory disorders of vagina: Secondary | ICD-10-CM

## 2019-03-10 DIAGNOSIS — Z01419 Encounter for gynecological examination (general) (routine) without abnormal findings: Secondary | ICD-10-CM

## 2019-03-10 DIAGNOSIS — Z0000 Encounter for general adult medical examination without abnormal findings: Secondary | ICD-10-CM

## 2019-03-10 DIAGNOSIS — Z113 Encounter for screening for infections with a predominantly sexual mode of transmission: Secondary | ICD-10-CM

## 2019-03-10 DIAGNOSIS — Z803 Family history of malignant neoplasm of breast: Secondary | ICD-10-CM

## 2019-03-10 DIAGNOSIS — Z833 Family history of diabetes mellitus: Secondary | ICD-10-CM

## 2019-03-10 NOTE — Patient Instructions (Signed)
EXERCISE AND DIET:  We recommended that you start or continue a regular exercise program for good health. Regular exercise means any activity that makes your heart beat faster and makes you sweat.  We recommend exercising at least 30 minutes per day at least 3 days a week, preferably 4 or 5.  We also recommend a diet low in fat and sugar.  Inactivity, poor dietary choices and obesity can cause diabetes, heart attack, stroke, and kidney damage, among others.    ALCOHOL AND SMOKING:  Women should limit their alcohol intake to no more than 7 drinks/beers/glasses of wine (combined, not each!) per week. Moderation of alcohol intake to this level decreases your risk of breast cancer and liver damage. And of course, no recreational drugs are part of a healthy lifestyle.  And absolutely no smoking or even second hand smoke. Most people know smoking can cause heart and lung diseases, but did you know it also contributes to weakening of your bones? Aging of your skin?  Yellowing of your teeth and nails?  CALCIUM AND VITAMIN D:  Adequate intake of calcium and Vitamin D are recommended.  The recommendations for exact amounts of these supplements seem to change often, but generally speaking 1,200 mg of calcium (between diet and supplement) and 800 units of Vitamin D per day seems prudent. Certain women may benefit from higher intake of Vitamin D.  If you are among these women, your doctor will have told you during your visit.    PAP SMEARS:  Pap smears, to check for cervical cancer or precancers,  have traditionally been done yearly, although recent scientific advances have shown that most women can have pap smears less often.  However, every woman still should have a physical exam from her gynecologist every year. It will include a breast check, inspection of the vulva and vagina to check for abnormal growths or skin changes, a visual exam of the cervix, and then an exam to evaluate the size and shape of the uterus and  ovaries.  And after 45 years of age, a rectal exam is indicated to check for rectal cancers. We will also provide age appropriate advice regarding health maintenance, like when you should have certain vaccines, screening for sexually transmitted diseases, bone density testing, colonoscopy, mammograms, etc.   MAMMOGRAMS:  All women over 40 years old should have a yearly mammogram. Many facilities now offer a "3D" mammogram, which may cost around $50 extra out of pocket. If possible,  we recommend you accept the option to have the 3D mammogram performed.  It both reduces the number of women who will be called back for extra views which then turn out to be normal, and it is better than the routine mammogram at detecting truly abnormal areas.    COLON CANCER SCREENING: Now recommend starting at age 45. At this time colonoscopy is not covered for routine screening until 50. There are take home tests that can be done between 45-49.   COLONOSCOPY:  Colonoscopy to screen for colon cancer is recommended for all women at age 50.  We know, you hate the idea of the prep.  We agree, BUT, having colon cancer and not knowing it is worse!!  Colon cancer so often starts as a polyp that can be seen and removed at colonscopy, which can quite literally save your life!  And if your first colonoscopy is normal and you have no family history of colon cancer, most women don't have to have it again for   10 years.  Once every ten years, you can do something that may end up saving your life, right?  We will be happy to help you get it scheduled when you are ready.  Be sure to check your insurance coverage so you understand how much it will cost.  It may be covered as a preventative service at no cost, but you should check your particular policy.      Breast Self-Awareness Breast self-awareness means being familiar with how your breasts look and feel. It involves checking your breasts regularly and reporting any changes to your  health care provider. Practicing breast self-awareness is important. A change in your breasts can be a sign of a serious medical problem. Being familiar with how your breasts look and feel allows you to find any problems early, when treatment is more likely to be successful. All women should practice breast self-awareness, including women who have had breast implants. How to do a breast self-exam One way to learn what is normal for your breasts and whether your breasts are changing is to do a breast self-exam. To do a breast self-exam: Look for Changes  1. Remove all the clothing above your waist. 2. Stand in front of a mirror in a room with good lighting. 3. Put your hands on your hips. 4. Push your hands firmly downward. 5. Compare your breasts in the mirror. Look for differences between them (asymmetry), such as: ? Differences in shape. ? Differences in size. ? Puckers, dips, and bumps in one breast and not the other. 6. Look at each breast for changes in your skin, such as: ? Redness. ? Scaly areas. 7. Look for changes in your nipples, such as: ? Discharge. ? Bleeding. ? Dimpling. ? Redness. ? A change in position. Feel for Changes Carefully feel your breasts for lumps and changes. It is best to do this while lying on your back on the floor and again while sitting or standing in the shower or tub with soapy water on your skin. Feel each breast in the following way:  Place the arm on the side of the breast you are examining above your head.  Feel your breast with the other hand.  Start in the nipple area and make  inch (2 cm) overlapping circles to feel your breast. Use the pads of your three middle fingers to do this. Apply light pressure, then medium pressure, then firm pressure. The light pressure will allow you to feel the tissue closest to the skin. The medium pressure will allow you to feel the tissue that is a little deeper. The firm pressure will allow you to feel the tissue  close to the ribs.  Continue the overlapping circles, moving downward over the breast until you feel your ribs below your breast.  Move one finger-width toward the center of the body. Continue to use the  inch (2 cm) overlapping circles to feel your breast as you move slowly up toward your collarbone.  Continue the up and down exam using all three pressures until you reach your armpit.  Write Down What You Find  Write down what is normal for each breast and any changes that you find. Keep a written record with breast changes or normal findings for each breast. By writing this information down, you do not need to depend only on memory for size, tenderness, or location. Write down where you are in your menstrual cycle, if you are still menstruating. If you are having trouble noticing differences   in your breasts, do not get discouraged. With time you will become more familiar with the variations in your breasts and more comfortable with the exam. How often should I examine my breasts? Examine your breasts every month. If you are breastfeeding, the best time to examine your breasts is after a feeding or after using a breast pump. If you menstruate, the best time to examine your breasts is 5-7 days after your period is over. During your period, your breasts are lumpier, and it may be more difficult to notice changes. When should I see my health care provider? See your health care provider if you notice:  A change in shape or size of your breasts or nipples.  A change in the skin of your breast or nipples, such as a reddened or scaly area.  Unusual discharge from your nipples.  A lump or thick area that was not there before.  Pain in your breasts.  Anything that concerns you.  

## 2019-03-10 NOTE — Progress Notes (Addendum)
43 y.o. [id number] Single Black or African American Not Hispanic or Latino female here for annual exam.   In September she fractured her hip socket, tripped from standing position. She had surgery, is using a walker.  This is her second major break in 2 years.  She reports having a normal DEXA. Normal PTH, Calcium.  She has had Covid this fall, had mainly GI symptoms. Better now.   She c/o a vaginal odor with an intermittent vaginal discharge.   Cycles are monthly, since August she has been bleeding for 10 days. Typically they last for up to 5 days. Heavier flow, heavy x 4-5 days, saturates a super + tampon, a pad and a depends in 2-3 hours.  Period Cycle (Days): 28 Period Duration (Days): 7-10 days Period Pattern: Regular Menstrual Flow: Light, Heavy Menstrual Control: Tampon, Thin pad Menstrual Control Change Freq (Hours): changing tampon/pad every 2-3 hours Dysmenorrhea: (!) Severe Dysmenorrhea Symptoms: Cramping  Patient's last menstrual period was 02/15/2019.          Sexually active: No.  The current method of family planning is abstinence.     Exercising: No.  The patient does not participate in regular exercise at present. Smoker:  no  Health Maintenance: Pap:  02-18-15 Neg:Neg HR HPV History of abnormal Pap:  Yes, Hx colpo/cryotherapy to cervix 2003;paps normal since. MMG:  03/04/2019 Birads 0 diagnostic mammogram and ultrasound needed Colonoscopy:  03-21-16 polyps with Dr.Mann BMD:   N/A TDaP:  03/26/2016  Gardasil:   N/A   reports that she has never smoked. She has never used smokeless tobacco. She reports current alcohol use of about 1.0 standard drinks of alcohol per week. She reports that she does not use drugs.6 kids, ages range from 76-30. 3 grandchildren  Past Medical History:  Diagnosis Date  . Abnormal Pap smear 1997   LAST PAP 10/2010  . Allergy    SEASONAL  . Anemia    CHRONIC  . 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 Arthritis   . Blood dyscrasia     SICKLE CELL TRAIT  . Blood transfusion without reported diagnosis 2005   after vaginal delivery  . Breast mass in female 2003  . Broken foot 03/16/2016   right  . Bruises easily   . BV (bacterial vaginosis)   . Cancer ([place])   . Cyst of ovary 2003  . Depression 2003  . DUB (dysfunctional uterine bleeding) 2003  . DVT, lower extremity ([place]) 10/2014   left leg  . Fatigue 06/10/2018  . Fibroid    LONG TERM DX  . Fibromyalgia 2004  . Fibrositis   . GERD (gastroesophageal reflux disease) 2007  . H/O amenorrhea 2006  . H/O menorrhagia 2011  . H/O sickle cell trait   . Heart murmur 2007  . History of abnormal cervical Pap smear 2003   had colposcopy and cryo.  Follow up paps are normal.  . Hx: UTI (urinary tract infection) 2003  . Hypertension 2005   RESOLVED WITH WT LOSS  . IBD (inflammatory bowel disease)   . IBS (irritable bowel syndrome)   . Infection    OCC YEAST  . Low back pain 2003  . Menses, irregular 2003  . Migraine   . Nausea and vomiting 06/10/2018  . Neck pain 06/10/2018  . Night sweats 06/10/2018  . Obese 2003  . Pulmonary embolism ([place]) 10/2014  . Sacroiliitis ([place])   . Sickle-cell trait ([place])   . Suspected COVID-19 virus infection  07/28/2018  . Trichomonas   . Uterine leiomyoma   . Vaginitis and vulvovaginitis 2004  . Weight loss 06/10/2018  . Yeast vaginitis 04/24/2010    Past Surgical History:  Procedure Laterality Date  . AUGMENTATION MAMMAPLASTY  4128   Silicone implants  . broken ankle Right   . CERVICAL CONE BIOPSY  1997  . COSMETIC SURGERY  2012   thigh lift  . CYSTECTOMY Right    obtain date from patient, was not on history form dated 06/16/10--benign cyst removed Rt.ovary  . DILATION AND CURETTAGE OF UTERUS    . LAPAROSCOPIC GASTRIC BANDING  05/20/2007  . ORIF ACETABULAR FRACTURE Right 12/25/2018   Procedure: OPEN REDUCTION INTERNAL FIXATION (ORIF) ACETABULAR FRACTURE;  Surgeon: Altamese Robert Lee, MD;  Location: [facility];  Service: Orthopedics;  Laterality:  Right;  . WISDOM TOOTH EXTRACTION      Current Outpatient Medications  Medication Sig Dispense Refill  . acetaminophen (TYLENOL) 500 MG tablet Take 1 tablet (500 mg total) by mouth every 12 (twelve) hours. 30 tablet 0  . ALPRAZolam (XANAX) 1 MG tablet Take 1 mg by mouth at bedtime as needed for anxiety.    [name] amphetamine-dextroamphetamine (ADDERALL) 30 MG tablet Take 30 mg by mouth 2 (two) times daily. Morning and noon    . desloratadine (CLARINEX) 5 MG tablet Take 5 mg by mouth daily.    . fluticasone (FLONASE) 50 MCG/ACT nasal spray Place 1 spray into both nostrils daily as needed for allergies.     [name] linaclotide (LINZESS) 145 MCG CAPS capsule Take 145 mcg by mouth as needed.    . lubiprostone (AMITIZA) 24 MCG capsule Take 1 capsule (24 mcg total) by mouth daily with breakfast. (Patient taking differently: Take 24 mcg by mouth daily with breakfast. As needed) 30 capsule 5  . methocarbamol (ROBAXIN) 500 MG tablet Take 1-2 tablets (500-1,000 mg total) by mouth every 6 (six) hours as needed for muscle spasms. 60 tablet 0  . nystatin cream (MYCOSTATIN) Apply 1 application topically 2 (two) times daily. Apply to affected area BID for up to 7 days. 30 g 0  . SPRAVATO, 56 MG DOSE, 28 MG/DEVICE SOPK Place 2 sprays into the nose every 14 (fourteen) days.    [name] zolpidem (AMBIEN) 5 MG tablet Take 5 mg by mouth at bedtime.      No current facility-administered medications for this visit.     Family History  Problem Relation Age of Onset  . Diabetes Mother   . Arthritis Mother   . Hyperlipidemia Mother   . Other Mother        VARICOSE VEINS; DECREASED HEART RATE; +Hysterectomy for unspecified reason  . Fibromyalgia Mother   . Colon polyps Mother        unspecified number  . Depression Mother   . Dementia Mother   . Heart disease Father   . Hypertension Father   . Alcohol abuse Father   . Drug abuse Father   . Stroke Father   . Ovarian cancer Sister        dx. <43y  . Cervical cancer Sister  52  . Multiple sclerosis Brother   . Thyroid disease Brother        hypothyroidism  . Lung cancer Maternal Grandmother 74       secondhand smoke  . Depression Maternal Grandmother   . Miscarriages / Stillbirths Maternal Grandmother   . Other Maternal Grandmother        binswanger's disease  . Birth defects Maternal  Aunt        EXTRA DIGITS; MUSCULAR DISEASE  . Early death Maternal Aunt 3  . COPD Maternal Grandfather   . Stroke Maternal Grandfather   . Ovarian cancer Maternal Aunt 87       Dec.age 13 Ovarian CA  . Early death Maternal Aunt        d. three days after birth; unknown cause  . Breast cancer Maternal Aunt 20  . Colon polyps Maternal Aunt        unspecified number  . Liver disease Paternal Aunt   . Ovarian cancer Maternal Aunt 21  . Depression Maternal Aunt   . Heart attack Paternal Uncle        in mid-50s  . Alcohol abuse Maternal Uncle   . Breast cancer Cousin        maternal  MAunt and sister with ovarian cancer. Maternal cousin with breast cancer. 3MAunts with breast cancer. MAunt and MCousin with colon cancer. Patient reports negative BRCA testing in her 52's.   Review of Systems  Constitutional: Negative.   HENT: Negative.   Eyes: Negative.   Respiratory: Negative.   Cardiovascular: Negative.   Gastrointestinal: Negative.   Endocrine: Negative.   Genitourinary: Positive for menstrual problem.  Musculoskeletal: Negative.   Skin: Negative.   Allergic/Immunologic: Negative.   Neurological: Negative.   Hematological: Negative.   Psychiatric/Behavioral: Negative.     Exam:   BP 136/84 (BP Location: Right Arm, Patient Position: Sitting, Cuff Size: Normal)   Pulse 80   Temp (!) 97.2 F (36.2 C) (Skin)   Ht 5' 5.5" (1.664 m)   Wt 201 lb 6.4 oz (91.4 kg)   LMP 02/15/2019   BMI 33.00 kg/m   Weight change: _0 @ Height:   Height: 5' 5.5" (166.4 cm)  Ht Readings from Last 3 Encounters:  03/10/19 5' 5.5" (1.664 m)  12/25/18 _1  (1.676 m)   07/03/18 _2  (1.676 m)    General appearance: alert, cooperative and appears stated age Head: Normocephalic, without obvious abnormality, atraumatic Neck: no adenopathy, supple, symmetrical, trachea midline and thyroid normal to inspection and palpation Lungs: clear to auscultation bilaterally Cardiovascular: regular rate and rhythm Breasts: normal appearance, no masses or tenderness, bilateral implants Abdomen: soft, non-tender; non distended,  no masses,  no organomegaly Extremities: extremities normal, atraumatic, no cyanosis or edema Skin: Skin color, texture, turgor normal. No rashes or lesions Lymph nodes: Cervical, supraclavicular, and axillary nodes normal. No abnormal inguinal nodes palpated Neurologic: Grossly normal   Pelvic: External genitalia:  no lesions              Urethra:  normal appearing urethra with no masses, tenderness or lesions              Bartholins and Skenes: normal                 Vagina: normal appearing vagina with an increase in watery, white vaginal d/c              Cervix: no lesions               Bimanual Exam:  Uterus:  normal size, contour, position, consistency, mobility, non-tender and retroverted              Adnexa: no mass, fullness, tenderness               Rectovaginal: Confirms               Anus:  normal  sphincter tone, no lesions  Chaperone was present for exam.  A:  Well Woman with normal exam  Vaginal odor  Fractured hip socket (fragility fracture)  Vit d def  H/O anemia  Menorrhagia, normal TSH  FH of breast, ovarian and colon cancer. She reports negative BRCA testing in her 14's, but never saw a Dietitian  FH of DM  Vit c def (requests check)  P:   Pap with hpv  Screening STD  Nuswab for STD and bv  Return for pelvic ultrasound, possible endometrial biopsy  Screening labs, vit d, vit c, iron studies  Referral to genetics  Colonoscopy due q 5 years  Just had screening mammogram, needs f/u imaging.   Get a  [facility]  Consider referral to Endocrinology with her fragility fractures.     03/13/19 Addendum: [facility] DEXA from 02/20/19 reviewed Low T score of -1.1 in her left femur

## 2019-03-11 ENCOUNTER — Ambulatory Visit
Admission: RE | Admit: 2019-03-11 | Discharge: 2019-03-11 | Disposition: A | Discharge status: Home / Self Care | Payer: Managed Care, Other (non HMO) | Source: Ambulatory Visit | Attending: Physician Assistant | Admitting: Physician Assistant

## 2019-03-11 ENCOUNTER — Other Ambulatory Visit: Payer: Self-pay | Admitting: Obstetrics and Gynecology

## 2019-03-11 ENCOUNTER — Other Ambulatory Visit: Payer: Self-pay

## 2019-03-11 DIAGNOSIS — R928 Other abnormal and inconclusive findings on diagnostic imaging of breast: Secondary | ICD-10-CM

## 2019-03-12 ENCOUNTER — Telehealth: Payer: Self-pay

## 2019-03-12 ENCOUNTER — Other Ambulatory Visit: Payer: Self-pay | Admitting: Obstetrics and Gynecology

## 2019-03-12 DIAGNOSIS — Z8781 Personal history of (healed) traumatic fracture: Secondary | ICD-10-CM

## 2019-03-12 NOTE — Telephone Encounter (Signed)
Left message to call Arta Stump at [phone]. 

## 2019-03-12 NOTE — Telephone Encounter (Signed)
-----   Message from Salvadore Dom, MD sent at 03/11/2019  4:42 PM EST ----- Please let the patient know that she is anemic with low iron stores. If she can tolerate oral iron, she should start on ferrex 150 mg po qd, if she can't tolerate it then refer her for a hematology consultation for an iron transfusion. She is being scheduled to return for an ultrasound, possible biopsy. Her vit d is low, she should start taking 1,000 IU of vit d 3 daily. Her LDL is mildly elevated. Her vit c is pending. The rest of her blood work is okay. Her pap and vaginitis panel/cervical panel are pending.

## 2019-03-13 ENCOUNTER — Other Ambulatory Visit: Payer: Self-pay | Admitting: *Deleted

## 2019-03-13 LAB — CYTOLOGY - PAP
Comment: NEGATIVE
Diagnosis: NEGATIVE
High risk HPV: POSITIVE — AB

## 2019-03-13 LAB — CBC
Hematocrit: 32.2 % — ABNORMAL LOW (ref 34.0–46.6)
Hemoglobin: 9.8 g/dL — ABNORMAL LOW (ref 11.1–15.9)
MCH: 23.1 pg — ABNORMAL LOW (ref 26.6–33.0)
MCHC: 30.4 g/dL — ABNORMAL LOW (ref 31.5–35.7)
MCV: 76 fL — ABNORMAL LOW (ref 79–97)
Platelets: 386 10*3/uL (ref 150–450)
RBC: 4.24 x10E6/uL (ref 3.77–5.28)
RDW: 17.1 % — ABNORMAL HIGH (ref 11.7–15.4)
WBC: 4 10*3/uL (ref 3.4–10.8)

## 2019-03-13 LAB — COMPREHENSIVE METABOLIC PANEL
ALT: 10 IU/L (ref 0–32)
AST: 16 IU/L (ref 0–40)
Albumin/Globulin Ratio: 1.5 (ref 1.2–2.2)
Albumin: 4.1 g/dL (ref 3.8–4.8)
Alkaline Phosphatase: 61 IU/L (ref 39–117)
BUN/Creatinine Ratio: 9 (ref 9–23)
BUN: 8 mg/dL (ref 6–24)
Bilirubin Total: 0.3 mg/dL (ref 0.0–1.2)
CO2: 25 mmol/L (ref 20–29)
Calcium: 8.8 mg/dL (ref 8.7–10.2)
Chloride: 102 mmol/L (ref 96–106)
Creatinine, Ser: 0.88 mg/dL (ref 0.57–1.00)
GFR calc Af Amer: 92 mL/min/{1.73_m2} (ref >59)
GFR calc non Af Amer: 80 mL/min/{1.73_m2} (ref >59)
Globulin, Total: 2.7 g/dL (ref 1.5–4.5)
Glucose: 92 mg/dL (ref 65–99)
Potassium: 3.9 mmol/L (ref 3.5–5.2)
Sodium: 138 mmol/L (ref 134–144)
Total Protein: 6.8 g/dL (ref 6.0–8.5)

## 2019-03-13 LAB — NUSWAB VAGINITIS PLUS (VG+)
Atopobium vaginae: HIGH Score — AB
BVAB 2: HIGH Score — AB
Candida albicans, NAA: NEGATIVE
Candida glabrata, NAA: NEGATIVE
Chlamydia trachomatis, NAA: NEGATIVE
Megasphaera 1: HIGH Score — AB
Neisseria gonorrhoeae, NAA: NEGATIVE
Trich vag by NAA: NEGATIVE

## 2019-03-13 LAB — LIPID PANEL
Chol/HDL Ratio: 3 ratio (ref 0.0–4.4)
Cholesterol, Total: 201 mg/dL — ABNORMAL HIGH (ref 100–199)
HDL: 68 mg/dL (ref >39)
LDL Chol Calc (NIH): 117 mg/dL — ABNORMAL HIGH (ref 0–99)
Triglycerides: 89 mg/dL (ref 0–149)
VLDL Cholesterol Cal: 16 mg/dL (ref 5–40)

## 2019-03-13 LAB — B12 AND FOLATE PANEL
Folate: 3.5 ng/mL (ref >3.0)
Vitamin B-12: 254 pg/mL (ref 232–1245)

## 2019-03-13 LAB — VITAMIN C: Vitamin C: 1 mg/dL (ref 0.4–2.0)

## 2019-03-13 LAB — HEMOGLOBIN A1C
Est. average glucose Bld gHb Est-mCnc: 108 mg/dL
Hgb A1c MFr Bld: 5.4 % (ref 4.8–5.6)

## 2019-03-13 LAB — RPR: RPR Ser Ql: NONREACTIVE

## 2019-03-13 LAB — HIV ANTIBODY (ROUTINE TESTING W REFLEX): HIV Screen 4th Generation wRfx: NONREACTIVE

## 2019-03-13 LAB — VITAMIN D 25 HYDROXY (VIT D DEFICIENCY, FRACTURES): Vit D, 25-Hydroxy: 22.7 ng/mL — ABNORMAL LOW (ref 30.0–100.0)

## 2019-03-13 LAB — FERRITIN: Ferritin: 9 ng/mL — ABNORMAL LOW (ref 15–150)

## 2019-03-13 MED ORDER — METRONIDAZOLE 500 MG PO TABS: 500.0000 mg | ORAL_TABLET | Freq: Two times a day (BID) | ORAL | 0 refills | Status: DC

## 2019-03-16 ENCOUNTER — Other Ambulatory Visit: Payer: Self-pay

## 2019-03-16 ENCOUNTER — Telehealth: Payer: Self-pay | Admitting: Genetic Counselor

## 2019-03-16 DIAGNOSIS — D509 Iron deficiency anemia, unspecified: Secondary | ICD-10-CM

## 2019-03-16 NOTE — Telephone Encounter (Signed)
A genetic counseling appt has been scheduled for Tiffany [name] to see Roma Kayser on 12/14 at 2pm. Pt aware to arrive 15 minutes.

## 2019-03-16 NOTE — Progress Notes (Signed)
GYNECOLOGY  VISIT   HPI: 43 y.o.   Single Black or African American Not Hispanic or Latino  female   [phone] with Patient's last menstrual period was 02/15/2019.   here for evaluation of menorrhagia leading to anemia and severe dysmenorrhea. Recent hgb was 9.8 with a ferritin of 9.   She has a h/o DVT and PE.   She has a referral to see Dr Marin Olp for possible iron transfusion and to see Dr Garnet Koyanagi for fragility fractures (DEXA with T score of -1.1. A referral has also been placed to Genetics for her FH of breast, ovarian and colon cancer.   She struggles with depression and anxiety. She has lost lots of family members including 2 siblings and a parent.   GYNECOLOGIC HISTORY: Patient's last menstrual period was 02/15/2019. Contraception: abstinence Menopausal hormone therapy: none        OB History    Gravida  12   Para  6   Term  6   Preterm      AB  6   Living  6     SAB  6   TAB      Ectopic      Multiple      Live Births  6              Patient Active Problem List   Diagnosis Date Noted  . Acetabulum fracture, right ([place]) 12/26/2018  . Conversion disorder 10/06/2018  . Complaints of total body pain 10/06/2018  . Suspected COVID-19 virus infection 07/28/2018  . Cognitive changes 07/03/2018  . Attention deficit disorder 07/03/2018  . Cyst of ovary 06/10/2018  . Weight loss 06/10/2018  . Neck pain 06/10/2018  . Night sweats 06/10/2018  . Fatigue 06/10/2018  . Nausea and vomiting 06/10/2018  . White matter abnormality on MRI of brain 06/09/2018  . ASCUS with positive high risk HPV cervical 12/17/2017  . Anemia 11/27/2017  . Palpitations 11/28/2016  . Vitamin D deficiency 11/28/2016  . Abdominal bloating 12/27/2015  . Abnormal weight gain 12/23/2015  . Peripheral edema 11/22/2015  . Atypical chest pain 11/10/2015  . Gastric polyps 11/10/2015  . Hypokalemia 08/15/2015  . Seasonal allergic rhinitis due to pollen 07/05/2015  . Sickle cell trait  ([place]) 07/05/2015  . Genetic testing 06/13/2015  . Family history of breast cancer 06/02/2015  . Family history of malignant neoplasm of ovary 06/02/2015  . BV (bacterial vaginosis) 02/18/2015  . Hemorrhoids 02/18/2015  . Fibrositis 11/30/2014  . Left leg DVT ([place]) 11/18/2014  . History of DVT of lower extremity 11/18/2014  . Left leg pain 11/09/2014  . History of laparoscopic adjustable gastric banding 01/29/2012  . Depression 12/19/2011  . Fibromyalgia 12/19/2011  . Arthritis 12/19/2011  . Irritable bowel syndrome 12/19/2011  . Gastroesophageal reflux disease 12/19/2011  . Uterine leiomyoma 12/19/2011  . Current moderate episode of major depressive disorder without prior episode ([place]) 12/19/2011  . Undifferentiated inflammatory arthritis ([place]) 12/19/2011  . Abnormal cervical Papanicolaou smear 04/10/1995    Past Medical History:  Diagnosis Date  . Abnormal Pap smear 1997   LAST PAP 10/2010  . Allergy    SEASONAL  . Anemia    CHRONIC  . 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 Arthritis   . Blood dyscrasia    SICKLE CELL TRAIT  . Blood transfusion without reported diagnosis 2005   after vaginal delivery  . Breast mass in female 2003  . Broken foot 03/16/2016   right  .  Bruises easily   . BV (bacterial vaginosis)   . Cancer ([place])   . Cyst of ovary 2003  . Depression 2003  . DUB (dysfunctional uterine bleeding) 2003  . DVT, lower extremity ([place]) 10/2014   left leg  . Fatigue 06/10/2018  . Fibroid    LONG TERM DX  . Fibromyalgia 2004  . Fibrositis   . GERD (gastroesophageal reflux disease) 2007  . H/O amenorrhea 2006  . H/O menorrhagia 2011  . H/O sickle cell trait   . Heart murmur 2007  . History of abnormal cervical Pap smear 2003   had colposcopy and cryo.  Follow up paps are normal.  . Hx: UTI (urinary tract infection) 2003  . Hypertension 2005   RESOLVED WITH WT LOSS  . IBD (inflammatory bowel disease)   . IBS (irritable bowel syndrome)   . Infection    OCC  YEAST  . Low back pain 2003  . Menses, irregular 2003  . Migraine   . Nausea and vomiting 06/10/2018  . Neck pain 06/10/2018  . Night sweats 06/10/2018  . Obese 2003  . Pulmonary embolism ([place]) 10/2014  . Sacroiliitis ([place])   . Sickle-cell trait ([place])   . Suspected COVID-19 virus infection 07/28/2018  . Trichomonas   . Uterine leiomyoma   . Vaginitis and vulvovaginitis 2004  . Weight loss 06/10/2018  . Yeast vaginitis 04/24/2010    Past Surgical History:  Procedure Laterality Date  . AUGMENTATION MAMMAPLASTY  8676   Silicone implants  . broken ankle Right   . CERVICAL CONE BIOPSY  1997  . COSMETIC SURGERY  2012   thigh lift  . CYSTECTOMY Right    obtain date from patient, was not on history form dated 06/16/10--benign cyst removed Rt.ovary  . DILATION AND CURETTAGE OF UTERUS    . LAPAROSCOPIC GASTRIC BANDING  05/20/2007  . ORIF ACETABULAR FRACTURE Right 12/25/2018   Procedure: OPEN REDUCTION INTERNAL FIXATION (ORIF) ACETABULAR FRACTURE;  Surgeon: Altamese [place], MD;  Location: [facility];  Service: Orthopedics;  Laterality: Right;  . WISDOM TOOTH EXTRACTION      Current Outpatient Medications  Medication Sig Dispense Refill  . acetaminophen (TYLENOL) 500 MG tablet Take 1 tablet (500 mg total) by mouth every 12 (twelve) hours. 30 tablet 0  . ALPRAZolam (XANAX) 1 MG tablet Take 1 mg by mouth at bedtime as needed for anxiety.    [name] amphetamine-dextroamphetamine (ADDERALL) 30 MG tablet Take 30 mg by mouth 2 (two) times daily. Morning and noon    . desloratadine (CLARINEX) 5 MG tablet Take 5 mg by mouth daily.    . fluticasone (FLONASE) 50 MCG/ACT nasal spray Place 1 spray into both nostrils daily as needed for allergies.     [name] linaclotide (LINZESS) 145 MCG CAPS capsule Take 145 mcg by mouth as needed.    . lubiprostone (AMITIZA) 24 MCG capsule Take 1 capsule (24 mcg total) by mouth daily with breakfast. (Patient taking differently: Take 24 mcg by mouth daily with breakfast. As needed) 30 capsule 5   . methocarbamol (ROBAXIN) 500 MG tablet Take 1-2 tablets (500-1,000 mg total) by mouth every 6 (six) hours as needed for muscle spasms. 60 tablet 0  . metroNIDAZOLE (FLAGYL) 500 MG tablet Take 1 tablet (500 mg total) by mouth 2 (two) times daily. 14 tablet 0  . nystatin cream (MYCOSTATIN) Apply 1 application topically 2 (two) times daily. Apply to affected area BID for up to 7 days. 30 g 0  . SPRAVATO, 56 MG  DOSE, 28 MG/DEVICE SOPK Place 2 sprays into the nose every 14 (fourteen) days.    [name] zolpidem (AMBIEN) 5 MG tablet Take 5 mg by mouth at bedtime.      No current facility-administered medications for this visit.      ALLERGIES: Flexeril [cyclobenzaprine hcl], Latex, Lamotrigine, Seasonal ic  [cholestatin], and Other  Family History  Problem Relation Age of Onset  . Diabetes Mother   . Arthritis Mother   . Hyperlipidemia Mother   . Other Mother        VARICOSE VEINS; DECREASED HEART RATE; +Hysterectomy for unspecified reason  . Fibromyalgia Mother   . Colon polyps Mother        unspecified number  . Depression Mother   . Dementia Mother   . Heart disease Father   . Hypertension Father   . Alcohol abuse Father   . Drug abuse Father   . Stroke Father   . Ovarian cancer Sister        dx. <43y  . Cervical cancer Sister 17  . Multiple sclerosis Brother   . Thyroid disease Brother        hypothyroidism  . Lung cancer Maternal Grandmother 74       secondhand smoke  . Depression Maternal Grandmother   . Miscarriages / Stillbirths Maternal Grandmother   . Other Maternal Grandmother        binswanger's disease  . Birth defects Maternal Aunt        EXTRA DIGITS; MUSCULAR DISEASE  . Early death Maternal Aunt 55  . COPD Maternal Grandfather   . Stroke Maternal Grandfather   . Ovarian cancer Maternal Aunt 60       Dec.age 50 Ovarian CA  . Early death Maternal Aunt        d. three days after birth; unknown cause  . Breast cancer Maternal Aunt 64  . Colon polyps Maternal Aunt         unspecified number  . Liver disease Paternal Aunt   . Ovarian cancer Maternal Aunt 76  . Depression Maternal Aunt   . Heart attack Paternal Uncle        in mid-50s  . Alcohol abuse Maternal Uncle   . Breast cancer Cousin        maternal    Social History   Socioeconomic History  . Marital status: Single    Spouse name: Not on file  . Number of children: 8  . Years of education: 72  . Highest education level: Not on file  Occupational History  . Occupation: CLINICAL RESEARCH  Social Needs  . Financial resource strain: Not on file  . Food insecurity    Worry: Not on file    Inability: Not on file  . Transportation needs    Medical: Not on file    Non-medical: Not on file  Tobacco Use  . Smoking status: Never Smoker  . Smokeless tobacco: Never Used  Substance and Sexual Activity  . Alcohol use: Yes    Alcohol/week: 1.0 standard drinks    Types: 1 Glasses of wine per week    Comment: maybe once or twice every 6 months  . Drug use: No  . Sexual activity: Not Currently    Partners: Male    Birth control/protection: Abstinence  Lifestyle  . Physical activity    Days per week: Not on file    Minutes per session: Not on file  . Stress: Not on file  Relationships  . Social  connections    Talks on phone: Not on file    Gets together: Not on file    Attends religious service: Not on file    Active member of club or organization: Not on file    Attends meetings of clubs or organizations: Not on file    Relationship status: Not on file  . Intimate partner violence    Fear of current or ex partner: Not on file    Emotionally abused: Not on file    Physically abused: Not on file    Forced sexual activity: Not on file  Other Topics Concern  . Not on file  Social History Narrative   Lives at home with 4 of her 8 children   Right handed    Review of Systems  Constitutional: Negative.   HENT: Negative.   Eyes: Negative.   Respiratory: Negative.    Cardiovascular: Negative.   Gastrointestinal: Negative.   Genitourinary: Negative.   Musculoskeletal: Negative.   Skin: Negative.   Neurological: Negative.   Endo/Heme/Allergies: Negative.   Psychiatric/Behavioral: Negative.     PHYSICAL EXAMINATION:    BP 128/78 (BP Location: Right Arm, Patient Position: Sitting, Cuff Size: Large)   Pulse 68   Temp (!) 97.5 F (36.4 C) (Skin)   Wt 201 lb (91.2 kg)   LMP 02/15/2019   BMI 32.94 kg/m     General appearance: alert, cooperative and appears stated age  Pelvic: External genitalia:  no lesions              Urethra:  normal appearing urethra with no masses, tenderness or lesions              Bartholins and Skenes: normal                 Vagina: normal appearing vagina with normal color and discharge, no lesions              Cervix: no lesions  The risks of endometrial biopsy were reviewed and a consent was obtained.  A speculum was placed in the vagina and the cervix was cleansed with betadine. An endometrial pipelle was placed into the endometrial cavity. The uterus sounded to ~7 cm. The endometrial biopsy was performed, minimal tissue was obtained. The speculum was removed. There were no complications.    Chaperone was present for exam.  ASSESSMENT Menorrhagia leading to anemia, not candidate for OCP's (H/O PE) Severe dysmenorrhea FH of breast, ovarian and colon cancer, negative BRCA testing in her 49's H/O fragility fracture x 2 Depression and anxiety, managed by Psychiatry and Primary, needs a counselor   PLAN Ultrasound multiple small myomas, likely adenomyosis Endometrial biopsy done Recommend mirena IUD for cycle control and future contraception. Referral already placed to see hematology Referral already placed to see endocrinology Referral to Genetics for further recommendations.  Names of counselors given   An After Visit Summary was printed and given to the patient.  ~15 minutes face to face time of which over  50% was spent in counseling.

## 2019-03-17 ENCOUNTER — Ambulatory Visit: Payer: Managed Care, Other (non HMO) | Admitting: Obstetrics and Gynecology

## 2019-03-17 ENCOUNTER — Encounter: Payer: Self-pay | Admitting: Obstetrics and Gynecology

## 2019-03-17 ENCOUNTER — Other Ambulatory Visit: Payer: Self-pay

## 2019-03-17 ENCOUNTER — Ambulatory Visit (INDEPENDENT_AMBULATORY_CARE_PROVIDER_SITE_OTHER): Payer: Managed Care, Other (non HMO)

## 2019-03-17 ENCOUNTER — Other Ambulatory Visit (HOSPITAL_COMMUNITY)
Admission: RE | Admit: 2019-03-17 | Discharge: 2019-03-17 | Disposition: A | Discharge status: Home / Self Care | Payer: Managed Care, Other (non HMO) | Source: Ambulatory Visit | Attending: Obstetrics and Gynecology | Admitting: Obstetrics and Gynecology

## 2019-03-17 VITALS — BP 128/78 | HR 68 | Temp 97.5°F | Wt 201.0 lb

## 2019-03-17 DIAGNOSIS — Z862 Personal history of diseases of the blood and blood-forming organs and certain disorders involving the immune mechanism: Secondary | ICD-10-CM

## 2019-03-17 DIAGNOSIS — N920 Excessive and frequent menstruation with regular cycle: Secondary | ICD-10-CM

## 2019-03-17 DIAGNOSIS — D509 Iron deficiency anemia, unspecified: Secondary | ICD-10-CM | POA: Diagnosis present

## 2019-03-17 DIAGNOSIS — N946 Dysmenorrhea, unspecified: Secondary | ICD-10-CM

## 2019-03-17 DIAGNOSIS — Z3009 Encounter for other general counseling and advice on contraception: Secondary | ICD-10-CM

## 2019-03-17 NOTE — Patient Instructions (Signed)

## 2019-03-19 ENCOUNTER — Telehealth: Payer: Self-pay | Admitting: Hematology & Oncology

## 2019-03-19 LAB — SURGICAL PATHOLOGY

## 2019-03-19 NOTE — Telephone Encounter (Signed)
lmom to inform patient of new patient appt 12/30 at 130 pm

## 2019-03-20 ENCOUNTER — Telehealth: Payer: Self-pay | Admitting: Genetic Counselor

## 2019-03-20 NOTE — Telephone Encounter (Signed)
Called patient regarding upcoming virtual visit, patient's voicemail is full. Walk-in visit.

## 2019-03-23 ENCOUNTER — Inpatient Hospital Stay: Payer: Managed Care, Other (non HMO) | Attending: Genetic Counselor | Admitting: Genetic Counselor

## 2019-03-23 ENCOUNTER — Encounter: Payer: Self-pay | Admitting: Genetic Counselor

## 2019-03-23 ENCOUNTER — Other Ambulatory Visit: Payer: Self-pay

## 2019-03-23 ENCOUNTER — Other Ambulatory Visit: Payer: Self-pay | Admitting: Genetic Counselor

## 2019-03-23 ENCOUNTER — Encounter: Payer: Self-pay | Admitting: Obstetrics and Gynecology

## 2019-03-23 ENCOUNTER — Inpatient Hospital Stay: Payer: Managed Care, Other (non HMO)

## 2019-03-23 DIAGNOSIS — Z860100 Personal history of colon polyps, unspecified: Secondary | ICD-10-CM | POA: Insufficient documentation

## 2019-03-23 DIAGNOSIS — Z86718 Personal history of other venous thrombosis and embolism: Secondary | ICD-10-CM | POA: Insufficient documentation

## 2019-03-23 DIAGNOSIS — D508 Other iron deficiency anemias: Secondary | ICD-10-CM | POA: Insufficient documentation

## 2019-03-23 DIAGNOSIS — K909 Intestinal malabsorption, unspecified: Secondary | ICD-10-CM | POA: Insufficient documentation

## 2019-03-23 DIAGNOSIS — Z8041 Family history of malignant neoplasm of ovary: Secondary | ICD-10-CM | POA: Insufficient documentation

## 2019-03-23 DIAGNOSIS — D251 Intramural leiomyoma of uterus: Secondary | ICD-10-CM

## 2019-03-23 DIAGNOSIS — D573 Sickle-cell trait: Secondary | ICD-10-CM | POA: Insufficient documentation

## 2019-03-23 DIAGNOSIS — N921 Excessive and frequent menstruation with irregular cycle: Secondary | ICD-10-CM | POA: Insufficient documentation

## 2019-03-23 DIAGNOSIS — Z9884 Bariatric surgery status: Secondary | ICD-10-CM | POA: Insufficient documentation

## 2019-03-23 DIAGNOSIS — Z803 Family history of malignant neoplasm of breast: Secondary | ICD-10-CM | POA: Insufficient documentation

## 2019-03-23 DIAGNOSIS — D250 Submucous leiomyoma of uterus: Secondary | ICD-10-CM

## 2019-03-23 DIAGNOSIS — Z8371 Family history of colonic polyps: Secondary | ICD-10-CM | POA: Insufficient documentation

## 2019-03-23 LAB — GENETIC SCREENING ORDER

## 2019-03-23 NOTE — Progress Notes (Addendum)
REFERRING PROVIDER: Salvadore Dom, MD [address] [place],  [place] [address]  PRIMARY PROVIDER:  Harrison Mons, [place]  PRIMARY REASON FOR VISIT:  1. Family history of ovarian cancer   2. Family history of colonic polyps   3. Personal history of colonic polyps      HISTORY OF PRESENT ILLNESS:   Tiffany [name], a 43 y.o. female, was seen for a  cancer genetics consultation at the request of Dr. Talbert Nan due to a family history of ovarian cancer and a personal history of colon polyps and gastric polyposis.  Tiffany [name] presents to clinic today to discuss the possibility of a hereditary predisposition to cancer, genetic testing, and to further clarify her future cancer risks, as well as potential cancer risks for family members.   Tiffany [name] is a 43 y.o. female with no personal history of cancer.  Tiffany [name] was seen by [facility] in February 2017 due to a family history of cancer.  At that time a 42 gene panel was performed and was negative.  She returns today with additional clinical history of a personal history of colon polyps, gastric polyposis (I do not have the reports for these findings) and a report that her mother has had greater than 10 polyps on her colonoscopies.   CANCER HISTORY:  Oncology History   No history exists.     RISK FACTORS:  Menarche was at age 40-13.  First live birth at age 59.  OCP use for approximately 1 years.  Ovaries intact: yes.  Hysterectomy: no.  Menopausal status: premenopausal.  HRT use: 0 years. Colonoscopy: yes; a few polyps, but there is polyposis of the stomach. Mammogram within the last year: yes. Number of breast biopsies: 0. Up to date with pelvic exams: yes. Any excessive radiation exposure in the past: no  Past Medical History:  Diagnosis Date  . Abnormal Pap smear 1997   LAST PAP 10/2010  . Allergy    SEASONAL  . Anemia    CHRONIC  . 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 Arthritis   . Blood  dyscrasia    SICKLE CELL TRAIT  . Blood transfusion without reported diagnosis 2005   after vaginal delivery  . Breast mass in female 2003  . Broken foot 03/16/2016   right  . Bruises easily   . BV (bacterial vaginosis)   . Cancer ([place])   . Cyst of ovary 2003  . Depression 2003  . DUB (dysfunctional uterine bleeding) 2003  . DVT, lower extremity ([place]) 10/2014   left leg  . Family history of breast cancer   . Family history of colonic polyps   . Family history of ovarian cancer   . Fatigue 06/10/2018  . Fibroid    LONG TERM DX  . Fibromyalgia 2004  . Fibrositis   . GERD (gastroesophageal reflux disease) 2007  . H/O amenorrhea 2006  . H/O menorrhagia 2011  . H/O sickle cell trait   . Heart murmur 2007  . History of abnormal cervical Pap smear 2003   had colposcopy and cryo.  Follow up paps are normal.  . Hx: UTI (urinary tract infection) 2003  . Hypertension 2005   RESOLVED WITH WT LOSS  . IBD (inflammatory bowel disease)   . IBS (irritable bowel syndrome)   . Infection    OCC YEAST  . Low back pain 2003  . Menses, irregular 2003  . Migraine   . Nausea and  vomiting 06/10/2018  . Neck pain 06/10/2018  . Night sweats 06/10/2018  . Obese 2003  . Personal history of colonic polyps   . Pulmonary embolism ([place]) 10/2014  . Sacroiliitis ([place])   . Sickle-cell trait ([place])   . Suspected COVID-19 virus infection 07/28/2018  . Trichomonas   . Uterine leiomyoma   . Vaginitis and vulvovaginitis 2004  . Weight loss 06/10/2018  . Yeast vaginitis 04/24/2010    Past Surgical History:  Procedure Laterality Date  . AUGMENTATION MAMMAPLASTY  1610   Silicone implants  . broken ankle Right   . CERVICAL CONE BIOPSY  1997  . COSMETIC SURGERY  2012   thigh lift  . CYSTECTOMY Right    obtain date from patient, was not on history form dated 06/16/10--benign cyst removed Rt.ovary  . DILATION AND CURETTAGE OF UTERUS    . LAPAROSCOPIC GASTRIC BANDING  05/20/2007  . ORIF ACETABULAR FRACTURE Right  12/25/2018   Procedure: OPEN REDUCTION INTERNAL FIXATION (ORIF) ACETABULAR FRACTURE;  Surgeon: Altamese Aviston, MD;  Location: [facility];  Service: Orthopedics;  Laterality: Right;  . WISDOM TOOTH EXTRACTION      Social History   Socioeconomic History  . Marital status: Single    Spouse name: Not on file  . Number of children: 8  . Years of education: 37  . Highest education level: Not on file  Occupational History  . Occupation: CLINICAL RESEARCH  Tobacco Use  . Smoking status: Never Smoker  . Smokeless tobacco: Never Used  Substance and Sexual Activity  . Alcohol use: Yes    Alcohol/week: 1.0 standard drinks    Types: 1 Glasses of wine per week    Comment: maybe once or twice every 6 months  . Drug use: No  . Sexual activity: Not Currently    Partners: Male    Birth control/protection: Abstinence  Other Topics Concern  . Not on file  Social History Narrative   Lives at home with 4 of her 8 children   Right handed   Social Determinants of Health   Financial Resource Strain:   . Difficulty of Paying Living Expenses: Not on file  Food Insecurity:   . Worried About Charity fundraiser in the Last Year: Not on file  . Ran Out of Food in the Last Year: Not on file  Transportation Needs:   . Lack of Transportation (Medical): Not on file  . Lack of Transportation (Non-Medical): Not on file  Physical Activity:   . Days of Exercise per Week: Not on file  . Minutes of Exercise per Session: Not on file  Stress:   . Feeling of Stress : Not on file  Social Connections:   . Frequency of Communication with Friends and Family: Not on file  . Frequency of Social Gatherings with Friends and Family: Not on file  . Attends Religious Services: Not on file  . Active Member of Clubs or Organizations: Not on file  . Attends Archivist Meetings: Not on file  . Marital Status: Not on file     FAMILY HISTORY:  We obtained a detailed, 4-generation family history.  Significant  diagnoses are listed below: Family History  Problem Relation Age of Onset  . Diabetes Mother   . Arthritis Mother   . Hyperlipidemia Mother   . Other Mother        VARICOSE VEINS; DECREASED HEART RATE; +Hysterectomy for unspecified reason  . Fibromyalgia Mother   . Colon polyps Mother        >  29  . Depression Mother   . Dementia Mother   . Heart disease Father   . Hypertension Father   . Alcohol abuse Father   . Drug abuse Father   . Stroke Father   . Ovarian cancer Sister        dx. <43y  . Cervical cancer Sister 3  . Multiple sclerosis Brother   . Thyroid disease Brother        hypothyroidism  . Lung cancer Maternal Grandmother 74       secondhand smoke  . Depression Maternal Grandmother   . Miscarriages / Stillbirths Maternal Grandmother   . Other Maternal Grandmother        binswanger's disease  . Birth defects Maternal Aunt        EXTRA DIGITS; MUSCULAR DISEASE  . Early death Maternal Aunt 75  . COPD Maternal Grandfather   . Stroke Maternal Grandfather   . Early death Maternal Aunt        d. three days after birth; unknown cause  . Colon polyps Maternal Aunt        unspecified number  . Liver disease Paternal Aunt   . Other Son 25       amelioblastoma of the mouth  . Depression Maternal Aunt   . Heart attack Paternal Uncle        in mid-50s  . Alcohol abuse Maternal Uncle   . Breast cancer Cousin        maternal    The patient has eight children, her son was diagnosed with ameleoblastoma of the jaw at age 40. This was removed and he did not need chemotherapy or radiation.  She has a brother and sister.  Her brother died form complications of MS and her sister had cervical cancer in her 52s, and ovarian and vulvar cancer at 51.  Her father is deceased and her mother is living.  The patient's mother is healthy but reports having greater than 10 polyps on her colonoscopy.  She has three full sisters, one who died of rickets at age 53.  Her parents are deceased.   Her mother had several sisters, one who had breast cancer and had a daughter who had breast cancer, one who had a set of twins who died of colon cancer.    The patient's father was one of 75 children.  He had two sisters who had an unknown cancer.  His parents are deceased from non-cancer related issues.  Tiffany [name] is unaware of previous family history of genetic testing for hereditary cancer risks. Patient's maternal ancestors are of African American descent, and paternal ancestors are of African American descent. There is no reported Ashkenazi Jewish ancestry. There is no known consanguinity.  GENETIC COUNSELING ASSESSMENT: Tiffany [name] is a 43 y.o. female with a family history of ovarian cancer and a personal and family history of colon polyps which is somewhat suggestive of a hereditary cancer syndrome and predisposition to cancer given the rare cancer in her sister, and the colon polyps in her mother and herself and the gastric polyposis. We, therefore, discussed and recommended the following at today's visit.   DISCUSSION: We discussed that up to 20% of ovarian cancer is hereditary, with most cases associated with BRCA mutations.  There are other genes that can be associated with hereditary ovarian cancer syndromes.  These include BRIP1, RAD51C, RAD51D and the Lynch syndrome genes.  Based on the gastric polyposis, I am also concerned about the promoter 1B region of  APC which can cause GAPS syndrome, but is a bit differenct than classic FAP.  Tiffany [name] was seen in 2017 by Jeanine Luz for genetic testing.  Testing at that time on a 42 gene panel was negative.  In speaking with the testing lab, [facility], they started tested for the APC promoter region of 1B in February 2017 and therefore covered the area of concern.    We discussed that testing RNA may help identify deep intronic variant within the genes that may have been missed originally.  However, insurance may not cover additional testing since it has  been relatively recent since she underwent genetic testing.  Out of pocket testing may cost her $250.  We also discussed that we could send her sample to [facility] who can look at the genes slightly differently using RNA. We discussed that testing RNA may find hereditary mutations that were not identifiable through DNA testing.  There are a couple mutations in the APC gene that could help identify why she ha polyposis in the stomach that may not have been identified in 2017.  Therefore, additional testing may be needed.  We discussed that testing is beneficial for several reasons including knowing how to follow individuals after completing their treatment, and understand if other family members could be at risk for cancer and allow them to undergo genetic testing.   We reviewed the characteristics, features and inheritance patterns of hereditary cancer syndromes. We also discussed genetic testing, including the appropriate family members to test, the process of testing, insurance coverage and turn-around-time for results. We discussed the implications of a negative, positive, carrier and/or variant of uncertain significant result. We recommended Tiffany [name] pursue genetic testing for the CancerNext-Expanded gene panel. The CancerNext-Expanded gene panel offered by [facility] and includes sequencing and rearrangement analysis for the following 77 genes: AIP, ALK, APC*, ATM*, AXIN2, BAP1, BARD1, BLM, BMPR1A, BRCA1*, BRCA2*, BRIP1*, CDC73, CDH1*, CDK4, CDKN1B, CDKN2A, CHEK2*, CTNNA1, DICER1, FANCC, FH, FLCN, GALNT12, KIF1B, LZTR1, MAX, MEN1, MET, MLH1*, MSH2*, MSH3, MSH6*, MUTYH*, NBN, NF1*, NF2, NTHL1, PALB2*, PHOX2B, PMS2*, POT1, PRKAR1A, PTCH1, PTEN*, RAD51C*, RAD51D*, RB1, RECQL, RET, SDHA, SDHAF2, SDHB, SDHC, SDHD, SMAD4, SMARCA4, SMARCB1, SMARCE1, STK11, SUFU, TMEM127, TP53*, TSC1, TSC2, VHL and XRCC2 (sequencing and deletion/duplication); EGFR, EGLN1, HOXB13, KIT, MITF, PDGFRA, POLD1, and POLE  (sequencing only); EPCAM and GREM1 (deletion/duplication only). DNA and RNA analyses performed for * genes.   Based on Tiffany [name]'s family history of cancer, she meets medical criteria for genetic testing. Despite that she meets criteria, she may still have an out of pocket cost. We discussed that if her out of pocket cost for testing is over $100, the laboratory will call and confirm whether she wants to proceed with testing.  If the out of pocket cost of testing is less than $100 she will be billed by the genetic testing laboratory.   PLAN: After considering the risks, benefits, and limitations, Tiffany [name] provided informed consent to pursue genetic testing and the blood sample was sent to [facility] for analysis of the CancerNext-Expanded+RNAinsight. Results should be available within approximately 2-3 weeks' time, at which point they will be disclosed by telephone to Tiffany [name], as will any additional recommendations warranted by these results. Tiffany [name] will receive a summary of her genetic counseling visit and a copy of her results once available. This information will also be available in Epic.   Lastly, we encouraged Tiffany [name] to remain in contact with cancer genetics annually so that we  can continuously update the family history and inform her of any changes in cancer genetics and testing that may be of benefit for this family.   Tiffany [name] questions were answered to her satisfaction today. Our contact information was provided should additional questions or concerns arise. Thank you for the referral and allowing [place] to share in the care of your patient.   Travis Purk P. Florene [place], [place], [facility] Licensed, Insurance risk surveyor [email or web address] phone: [phone]  The patient was seen for a total of 45 minutes in face-to-face genetic counseling.  This patient was discussed with Drs. Magrinat, Lindi Adie and/or Burr Medico who agrees with the above.     _______________________________________________________________________ For Office Staff:  Number of people involved in session: 2 Was an Intern/ student involved with case: no

## 2019-03-24 ENCOUNTER — Telehealth: Payer: Self-pay | Admitting: Genetic Counselor

## 2019-03-24 ENCOUNTER — Telehealth: Payer: Self-pay | Admitting: Obstetrics and Gynecology

## 2019-03-24 DIAGNOSIS — B9689 Other specified bacterial agents as the cause of diseases classified elsewhere: Secondary | ICD-10-CM

## 2019-03-24 DIAGNOSIS — N760 Acute vaginitis: Secondary | ICD-10-CM

## 2019-03-24 MED ORDER — FLUCONAZOLE 150 MG PO TABS: ORAL_TABLET | ORAL | 0 refills | Status: DC

## 2019-03-24 NOTE — Telephone Encounter (Signed)
Pt requesting Diflucan after finishing BV treatment of Flagyl from 12/4-12/8. Pt now has yeast infection sx.   Med  Request: Diflucan  Orders pended if approved.

## 2019-03-24 NOTE — Telephone Encounter (Signed)
Patient is returning a call to Stephanie. °

## 2019-03-24 NOTE — Telephone Encounter (Signed)
Pt called and let know of sx white discharge, with no odor, itching.  Waiting on Diflucan Rx.

## 2019-03-24 NOTE — Telephone Encounter (Signed)
Spoke with the patient about her testing from 05/2015.  Explained that she had negative genetic testing at that time on 42 genes.  I am checking to see if the APC promoter 1B region was tested at that time, as that could provide some explanation of why she has gastric polyposis.  I explained that if the promoter region was tested in 2017, then the chance of retesting and finding anything is low.  We can send her sample to [facility] to have RNA testing performed.  Again, the likelihood that we will find anything is low, but if [employer] covers this it will be performed.  If [employer] does not cover it, then it will be $250. Patient wants to pursue testing regardless of whether it is covered.

## 2019-03-24 NOTE — Telephone Encounter (Signed)
Patient sent the following correspondence through [place].  Hello Dr. Talbert Nan,  I would like to know if you could please send a prescription to the pharmacy for a yeast infection.  In particular Diflucan, as when I take antibiotics they always seem to cause a yeast infection.  I normally take a total of two Diflucan (one or two days apart) to get rid of the yeast infection.  If you're able to call the prescription into the pharmacy, please call it into the [facility] on [address].  Thank you,  Tiffany [name]

## 2019-03-25 NOTE — Telephone Encounter (Signed)
03/16/2019 3:30 PM EST    Spoke with patient. Results given. Patient verbalizes understanding. Patient states she is unable to take oral iron, due to nausea with the pills. Requests referral. Referral placed to Hematology. Patient is aware she will be contacted directly to schedule. Patient also states she cannot take OTC Vitamin D as it makes her nauseated and causes her to vomit. Advised to increase vitamin d through diet. Patient is agreeable and is asking for rx for Vitamin D. States she is unable to take OTC Vitamin D as well due to nausea and vomiting. Advised will review with Dr.Jertson and return call, but increasing Vitamin D in diet may be enough.   Salvadore Dom, MD  03/17/2019 3:04 PM EST    Results and any recommendations were sent via [place].   Hi Tiffany [name], The only vit D supplements in the [place] are oral, tablets or liquid. I do think because of your bone health you should try and get adequate vit d. Please call with any questions. Sumner Boast

## 2019-04-02 ENCOUNTER — Encounter: Payer: Self-pay | Admitting: Hematology & Oncology

## 2019-04-02 ENCOUNTER — Inpatient Hospital Stay (HOSPITAL_BASED_OUTPATIENT_CLINIC_OR_DEPARTMENT_OTHER): Payer: Managed Care, Other (non HMO) | Admitting: Hematology & Oncology

## 2019-04-02 ENCOUNTER — Inpatient Hospital Stay: Payer: Managed Care, Other (non HMO)

## 2019-04-02 ENCOUNTER — Other Ambulatory Visit: Payer: Self-pay

## 2019-04-02 VITALS — BP 125/88 | HR 99 | Resp 16 | Wt 205.0 lb

## 2019-04-02 DIAGNOSIS — Z9884 Bariatric surgery status: Secondary | ICD-10-CM | POA: Diagnosis not present

## 2019-04-02 DIAGNOSIS — D573 Sickle-cell trait: Secondary | ICD-10-CM | POA: Diagnosis not present

## 2019-04-02 DIAGNOSIS — Z86010 Personal history of colonic polyps: Secondary | ICD-10-CM | POA: Diagnosis not present

## 2019-04-02 DIAGNOSIS — K2101 Gastro-esophageal reflux disease with esophagitis, with bleeding: Secondary | ICD-10-CM

## 2019-04-02 DIAGNOSIS — K909 Intestinal malabsorption, unspecified: Secondary | ICD-10-CM | POA: Diagnosis not present

## 2019-04-02 DIAGNOSIS — N921 Excessive and frequent menstruation with irregular cycle: Secondary | ICD-10-CM | POA: Diagnosis not present

## 2019-04-02 DIAGNOSIS — D508 Other iron deficiency anemias: Secondary | ICD-10-CM | POA: Diagnosis present

## 2019-04-02 DIAGNOSIS — Z803 Family history of malignant neoplasm of breast: Secondary | ICD-10-CM | POA: Diagnosis not present

## 2019-04-02 DIAGNOSIS — Z8371 Family history of colonic polyps: Secondary | ICD-10-CM

## 2019-04-02 DIAGNOSIS — Z8041 Family history of malignant neoplasm of ovary: Secondary | ICD-10-CM

## 2019-04-02 DIAGNOSIS — Z86718 Personal history of other venous thrombosis and embolism: Secondary | ICD-10-CM

## 2019-04-02 DIAGNOSIS — D500 Iron deficiency anemia secondary to blood loss (chronic): Secondary | ICD-10-CM

## 2019-04-02 HISTORY — DX: Iron deficiency anemia secondary to blood loss (chronic): D50.0

## 2019-04-02 HISTORY — DX: Intestinal malabsorption, unspecified: K90.9

## 2019-04-02 HISTORY — DX: Excessive and frequent menstruation with irregular cycle: N92.1

## 2019-04-02 LAB — CMP (CANCER CENTER ONLY)
ALT: 12 U/L (ref 0–44)
AST: 16 U/L (ref 15–41)
Albumin: 3.6 g/dL (ref 3.5–5.0)
Alkaline Phosphatase: 44 U/L (ref 38–126)
Anion gap: 8 (ref 5–15)
BUN: 10 mg/dL (ref 6–20)
CO2: 25 mmol/L (ref 22–32)
Calcium: 8.9 mg/dL (ref 8.9–10.3)
Chloride: 108 mmol/L (ref 98–111)
Creatinine: 0.85 mg/dL (ref 0.44–1.00)
GFR, Est AFR Am: >60 mL/min (ref >60)
GFR, Estimated: >60 mL/min (ref >60)
Glucose, Bld: 88 mg/dL (ref 70–99)
Potassium: 3.8 mmol/L (ref 3.5–5.1)
Sodium: 141 mmol/L (ref 135–145)
Total Bilirubin: 0.5 mg/dL (ref 0.3–1.2)
Total Protein: 6.8 g/dL (ref 6.5–8.1)

## 2019-04-02 LAB — CBC WITH DIFFERENTIAL (CANCER CENTER ONLY)
Abs Immature Granulocytes: 0 10*3/uL (ref 0.00–0.07)
Basophils Absolute: 0.1 10*3/uL (ref 0.0–0.1)
Basophils Relative: 1 %
Eosinophils Absolute: 0.4 10*3/uL (ref 0.0–0.5)
Eosinophils Relative: 8 %
HCT: 29.6 % — ABNORMAL LOW (ref 36.0–46.0)
Hemoglobin: 9.4 g/dL — ABNORMAL LOW (ref 12.0–15.0)
Immature Granulocytes: 0 %
Lymphocytes Relative: 53 %
Lymphs Abs: 2.4 10*3/uL (ref 0.7–4.0)
MCH: 22.7 pg — ABNORMAL LOW (ref 26.0–34.0)
MCHC: 31.8 g/dL (ref 30.0–36.0)
MCV: 71.5 fL — ABNORMAL LOW (ref 80.0–100.0)
Monocytes Absolute: 0.4 10*3/uL (ref 0.1–1.0)
Monocytes Relative: 9 %
Neutro Abs: 1.3 10*3/uL — ABNORMAL LOW (ref 1.7–7.7)
Neutrophils Relative %: 28 %
Platelet Count: 367 10*3/uL (ref 150–400)
RBC: 4.14 MIL/uL (ref 3.87–5.11)
RDW: 18.6 % — ABNORMAL HIGH (ref 11.5–15.5)
WBC Count: 4.6 10*3/uL (ref 4.0–10.5)
nRBC: 0 % (ref 0.0–0.2)

## 2019-04-02 LAB — IRON AND TIBC
Iron: 30 ug/dL — ABNORMAL LOW (ref 41–142)
Saturation Ratios: 8 % — ABNORMAL LOW (ref 21–57)
TIBC: 404 ug/dL (ref 236–444)
UIBC: 373 ug/dL (ref 120–384)

## 2019-04-02 LAB — RETICULOCYTES
Immature Retic Fract: 31.6 % — ABNORMAL HIGH (ref 2.3–15.9)
RBC.: 4.1 MIL/uL (ref 3.87–5.11)
Retic Count, Absolute: 53.3 10*3/uL (ref 19.0–186.0)
Retic Ct Pct: 1.3 % (ref 0.4–3.1)

## 2019-04-02 LAB — FERRITIN: Ferritin: 5 ng/mL — ABNORMAL LOW (ref 11–307)

## 2019-04-02 MED ORDER — VITAMIN D3 1.25 MG (50000 UT) PO CAPS: 1.0000 | ORAL_CAPSULE | ORAL | 8 refills | Status: AC

## 2019-04-02 MED ORDER — FOLIC ACID 1 MG PO TABS: 2.0000 mg | ORAL_TABLET | Freq: Every day | ORAL | 12 refills | Status: DC

## 2019-04-02 NOTE — Progress Notes (Signed)
Referral MD  Reason for Referral: Iron deficiency anemia secondary to malabsorption and menorrhagia; sickle cell trait  Chief Complaint  Patient presents with  . New Patient (Initial Visit)  : My iron is low.  HPI: Tiffany [name] is a very charming 43 year old African-American female.  She is originally from [place].  She works for a [employer] research [employer].  She actually has 8 children.  She has 6 of her own and she had opted to.  She is a really good woman for doing this.  She has had multiple surgeries.  She has had gastric bypass.  I think she had a lap band procedure done back in 2009.  She recently had right hip surgery from a fall.  She is followed by Dr. Talbert Nan of OB/GYN.  Surprisingly, she apparently has had a thromboembolic event about 4 or 5 years ago.  She was on Xarelto.  She has had problems with iron deficiency now.  3 weeks ago, her ferritin was only 9.  She does have sickle cell trait.  She says that her her children have sickle cell trait.  She is not on folic acid.  I will get her on folic acid.  She also has had some problems with vitamin D.  She has had low vitamin D levels.  She cannot take over-the-counter vitamin D.  She apparently had COVID-19 back in October.  She had gastrointestinal issues.  She was not hospitalized.  She says she was not feeling well for about a week and a half.  She has had some issues with depression.  She has had no problems with bleeding elsewhere.  She has had upper endoscopy and colonoscopy.  She apparently has polyps in her stomach and in her colon.  I think she was scoped about 3 years ago.  She has had no problems with fever.  She has had no rashes.  There is been no leg swelling.  Patient does have a limp.  The right hip still has recovery to make.  She has had no cough.  She does chew ice.  She has had no problems with tobacco use.  She does have a rare alcoholic beverage.  There is cancer in the family.  It sounds  like she had some genetic counseling done a few years ago.  I believe that she had a negative genetic panel that was performed.  Overall, I would say her performance status is ECOG 1.   Past Medical History:  Diagnosis Date  . Abnormal Pap smear 1997   LAST PAP 10/2010  . Allergy    SEASONAL  . Anemia    CHRONIC  . 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 Arthritis   . Blood dyscrasia    SICKLE CELL TRAIT  . Blood transfusion without reported diagnosis 2005   after vaginal delivery  . Breast mass in female 2003  . Broken foot 03/16/2016   right  . Bruises easily   . BV (bacterial vaginosis)   . Cancer ([place])   . Cyst of ovary 2003  . Depression 2003  . DUB (dysfunctional uterine bleeding) 2003  . DVT, lower extremity ([place]) 10/2014   left leg  . Family history of breast cancer   . Family history of colonic polyps   . Family history of ovarian cancer   . Fatigue 06/10/2018  . Fibroid    LONG TERM DX  . Fibromyalgia 2004  . Fibrositis   . GERD (gastroesophageal reflux disease) 2007  .  H/O amenorrhea 2006  . H/O menorrhagia 2011  . H/O sickle cell trait   . Heart murmur 2007  . History of abnormal cervical Pap smear 2003   had colposcopy and cryo.  Follow up paps are normal.  . Hx: UTI (urinary tract infection) 2003  . Hypertension 2005   RESOLVED WITH WT LOSS  . IBD (inflammatory bowel disease)   . IBS (irritable bowel syndrome)   . Infection    OCC YEAST  . Low back pain 2003  . Menses, irregular 2003  . Migraine   . Nausea and vomiting 06/10/2018  . Neck pain 06/10/2018  . Night sweats 06/10/2018  . Obese 2003  . Personal history of colonic polyps   . Pulmonary embolism ([place]) 10/2014  . Sacroiliitis ([place])   . Sickle-cell trait ([place])   . Suspected COVID-19 virus infection 07/28/2018  . Trichomonas   . Uterine leiomyoma   . Vaginitis and vulvovaginitis 2004  . Weight loss 06/10/2018  . Yeast vaginitis 04/24/2010  :  Past Surgical History:  Procedure Laterality Date  .  AUGMENTATION MAMMAPLASTY  [id number]   Silicone implants  . broken ankle Right   . CERVICAL CONE BIOPSY  1997  . COSMETIC SURGERY  2012   thigh lift  . CYSTECTOMY Right    obtain date from patient, was not on history form dated 06/16/10--benign cyst removed Rt.ovary  . DILATION AND CURETTAGE OF UTERUS    . LAPAROSCOPIC GASTRIC BANDING  05/20/2007  . ORIF ACETABULAR FRACTURE Right 12/25/2018   Procedure: OPEN REDUCTION INTERNAL FIXATION (ORIF) ACETABULAR FRACTURE;  Surgeon: Altamese Romeo, MD;  Location: [facility];  Service: Orthopedics;  Laterality: Right;  . WISDOM TOOTH EXTRACTION    :   Current Outpatient Medications:  .  acetaminophen (TYLENOL) 500 MG tablet, Take 1 tablet (500 mg total) by mouth every 12 (twelve) hours., Disp: 30 tablet, Rfl: 0 .  ALPRAZolam (XANAX) 1 MG tablet, Take 1 mg by mouth at bedtime as needed for anxiety., Disp: , Rfl:  .  amphetamine-dextroamphetamine (ADDERALL) 30 MG tablet, Take 30 mg by mouth 2 (two) times daily. Morning and noon, Disp: , Rfl:  .  Cholecalciferol (VITAMIN D3) 1.25 MG (50000 UT) CAPS, Take 1 capsule by mouth once a week., Disp: 12 capsule, Rfl: 8 .  desloratadine (CLARINEX) 5 MG tablet, Take 5 mg by mouth daily., Disp: , Rfl:  .  fluticasone (FLONASE) 50 MCG/ACT nasal spray, Place 1 spray into both nostrils daily as needed for allergies. , Disp: , Rfl:  .  folic acid (FOLVITE) 1 MG tablet, Take 2 tablets (2 mg total) by mouth daily., Disp: 60 tablet, Rfl: 12 .  lubiprostone (AMITIZA) 24 MCG capsule, Take 1 capsule (24 mcg total) by mouth daily with breakfast. (Patient taking differently: Take 24 mcg by mouth daily with breakfast. As needed), Disp: 30 capsule, Rfl: 5 .  pregabalin (LYRICA) 100 MG capsule, Take 100 mg by mouth 2 (two) times daily., Disp: , Rfl:  .  SPRAVATO, 56 MG DOSE, 28 MG/DEVICE SOPK, Place 2 sprays into the nose every 14 (fourteen) days., Disp: , Rfl:  .  zolpidem (AMBIEN) 5 MG tablet, Take 5 mg by mouth at bedtime. , Disp: , Rfl:  :  :  Allergies  Allergen Reactions  . Flexeril [Cyclobenzaprine Hcl] Shortness Of Breath, Itching, Swelling and Rash    Swelling of throat  . Latex Shortness Of Breath, Itching and Rash  . Lamotrigine Rash  . Seasonal Ic  [Cholestatin] Other (  See Comments)  . Other Other (See Comments)    RUBBER  :  Family History  Problem Relation Age of Onset  . Diabetes Mother   . Arthritis Mother   . Hyperlipidemia Mother   . Other Mother        VARICOSE VEINS; DECREASED HEART RATE; +Hysterectomy for unspecified reason  . Fibromyalgia Mother   . Colon polyps Mother        >10  . Depression Mother   . Dementia Mother   . Heart disease Father   . Hypertension Father   . Alcohol abuse Father   . Drug abuse Father   . Stroke Father   . Ovarian cancer Sister        dx. <43y  . Cervical cancer Sister 50  . Multiple sclerosis Brother   . Thyroid disease Brother        hypothyroidism  . Lung cancer Maternal Grandmother 74       secondhand smoke  . Depression Maternal Grandmother   . Miscarriages / Stillbirths Maternal Grandmother   . Other Maternal Grandmother        binswanger's disease  . Birth defects Maternal Aunt        EXTRA DIGITS; MUSCULAR DISEASE  . Early death Maternal Aunt 78  . COPD Maternal Grandfather   . Stroke Maternal Grandfather   . Early death Maternal Aunt        d. three days after birth; unknown cause  . Colon polyps Maternal Aunt        unspecified number  . Liver disease Paternal Aunt   . Other Son 25       amelioblastoma of the mouth  . Depression Maternal Aunt   . Heart attack Paternal Uncle        in mid-50s  . Alcohol abuse Maternal Uncle   . Breast cancer Cousin        maternal  :  Social History   Socioeconomic History  . Marital status: Single    Spouse name: Not on file  . Number of children: 8  . Years of education: 50  . Highest education level: Not on file  Occupational History  . Occupation: CLINICAL RESEARCH  Tobacco Use  .  Smoking status: Never Smoker  . Smokeless tobacco: Never Used  Substance and Sexual Activity  . Alcohol use: Yes    Alcohol/week: 1.0 standard drinks    Types: 1 Glasses of wine per week    Comment: maybe once or twice every 6 months  . Drug use: No  . Sexual activity: Not Currently    Partners: Male    Birth control/protection: Abstinence  Other Topics Concern  . Not on file  Social History Narrative   Lives at home with 4 of her 8 children   Right handed   Social Determinants of Health   Financial Resource Strain:   . Difficulty of Paying Living Expenses: Not on file  Food Insecurity:   . Worried About Charity fundraiser in the Last Year: Not on file  . Ran Out of Food in the Last Year: Not on file  Transportation Needs:   . Lack of Transportation (Medical): Not on file  . Lack of Transportation (Non-Medical): Not on file  Physical Activity:   . Days of Exercise per Week: Not on file  . Minutes of Exercise per Session: Not on file  Stress:   . Feeling of Stress : Not on file  Social Connections:   .  Frequency of Communication with Friends and Family: Not on file  . Frequency of Social Gatherings with Friends and Family: Not on file  . Attends Religious Services: Not on file  . Active Member of Clubs or Organizations: Not on file  . Attends Archivist Meetings: Not on file  . Marital Status: Not on file  Intimate Partner Violence:   . Fear of Current or Ex-Partner: Not on file  . Emotionally Abused: Not on file  . Physically Abused: Not on file  . Sexually Abused: Not on file  :  Review of Systems  Constitutional: Positive for malaise/fatigue.  HENT: Negative.   Eyes: Negative.   Respiratory: Negative.   Cardiovascular: Negative.   Gastrointestinal: Positive for nausea.  Genitourinary: Negative.   Musculoskeletal: Positive for joint pain.  Skin: Negative.   Neurological: Negative.   Endo/Heme/Allergies: Negative.   Psychiatric/Behavioral: Positive  for depression.     Exam:   Well-developed and well-nourished African-American female in no obvious distress.  Vital signs show temperature of 98.  Pulse 99.  Blood pressure 125/88.  Weight is 205 pounds.  Head neck exam shows no ocular or oral lesions.  There are no palpable cervical or supraclavicular lymph nodes.  Lungs are clear bilaterally.  Cardiac exam regular rate and rhythm with no murmurs, rubs or bruits.  Abdomen is soft.  She is mildly obese.  She has no fluid wave.  There is no palpable abdominal mass.  There is no palpable liver or spleen tip.  Back exam shows some tenderness over the right hip.  There is no tenderness over the spine or ribs.  Extremities shows decreased range of motion of the right hip.  She has no swelling in the legs.  She has a negative Homans sign bilaterally.  She has good strength in upper and lower extremities.  Skin exam shows no rashes, ecchymoses or petechia.  Neurological exam shows no focal neurological deficits.    @IPVITALS @   Recent Labs    04/02/19 1045  WBC 4.6  HGB 9.4*  HCT 29.6*  PLT 367   Recent Labs    04/02/19 1045  NA 141  K 3.8  CL 108  CO2 25  GLUCOSE 88  BUN 10  CREATININE 0.85  CALCIUM 8.9    Blood smear review: Slightly hypochromic and microcytic red blood cells.  There is minimal anisocytosis and poikilocytosis.  She has no nucleated red blood cells.  There is no schistocytes or spherocytes.  I see no target cells.  There is no rouleaux formation.  White cells appear normal in morphology maturation.  There is no immature myeloid or lymphoid forms.  There is no hypersegmented polys.  Platelets are adequate number and size.  Platelets are well granulated.  Pathology: None    Assessment and Plan: Tiffany [name] is a very charming 43 year old African-American female.  She has microcytic anemia.  I have to believe that she is going to have a marked iron deficiency.  I am sure that part of the low MCV could be from the sickle  cell trait.  I do not see  anything on her blood smear the looks like thalassemia.  I do think she needs folic acid for the sickle cell trait.  I will call in 2 mg daily of folic acid.  She also needs vitamin D.  She says she can take the weekly vitamin D.  I will send 50,000 unit dosing and for her.  We will see what her  iron studies look like.  We will then plan for supplemental IV iron administration.  I spent about 45 minutes with Tiffany [name].  She is very nice.  She is also doing a wonderful thing by adopting 2 children.  She looks good.  I am sure we can get her perform status improved a little bit and get her fatigue improved with IV iron.  We likely will get her back to see [place] in another 4-6 weeks.

## 2019-04-03 LAB — ERYTHROPOIETIN: Erythropoietin: 142.5 m[IU]/mL — ABNORMAL HIGH (ref 2.6–18.5)

## 2019-04-06 ENCOUNTER — Other Ambulatory Visit: Payer: Self-pay | Admitting: Family

## 2019-04-06 ENCOUNTER — Telehealth: Payer: Self-pay | Admitting: Hematology & Oncology

## 2019-04-06 LAB — HEMOGLOBINOPATHY EVALUATION
Hgb A2 Quant: 3.2 % (ref 1.8–3.2)
Hgb A: 62.4 % — ABNORMAL LOW (ref 96.4–98.8)
Hgb C: 0 %
Hgb F Quant: 0 % (ref 0.0–2.0)
Hgb S Quant: 34.4 % — ABNORMAL HIGH
Hgb Variant: 0 %

## 2019-04-06 NOTE — Telephone Encounter (Signed)
12.24 No LOS

## 2019-04-06 NOTE — Telephone Encounter (Signed)
Called and spoke with patient regarding appointment added for  Iron infusion x2.  She scheduled one date and will schedule next appointment tomorrow while she is in the office. Per 12/28 result note

## 2019-04-07 ENCOUNTER — Inpatient Hospital Stay: Payer: Managed Care, Other (non HMO)

## 2019-04-07 ENCOUNTER — Other Ambulatory Visit: Payer: Self-pay

## 2019-04-07 VITALS — BP 105/74 | HR 93 | Temp 97.7°F | Resp 17

## 2019-04-07 DIAGNOSIS — D508 Other iron deficiency anemias: Secondary | ICD-10-CM | POA: Diagnosis not present

## 2019-04-07 DIAGNOSIS — D500 Iron deficiency anemia secondary to blood loss (chronic): Secondary | ICD-10-CM

## 2019-04-07 DIAGNOSIS — N921 Excessive and frequent menstruation with irregular cycle: Secondary | ICD-10-CM

## 2019-04-07 DIAGNOSIS — K909 Intestinal malabsorption, unspecified: Secondary | ICD-10-CM

## 2019-04-07 MED ORDER — SODIUM CHLORIDE 0.9 % IV SOLN
200.0000 mg | Freq: Once | INTRAVENOUS | Status: AC
  Administered 2019-04-07: 200 mg via INTRAVENOUS
  Filled 2019-04-07: qty 200

## 2019-04-07 MED ORDER — SODIUM CHLORIDE 0.9 % IV SOLN
Freq: Once | INTRAVENOUS | Status: AC
  Filled 2019-04-07: qty 250

## 2019-04-08 ENCOUNTER — Inpatient Hospital Stay: Payer: Managed Care, Other (non HMO)

## 2019-04-08 ENCOUNTER — Ambulatory Visit: Payer: Managed Care, Other (non HMO) | Admitting: Hematology & Oncology

## 2019-04-14 ENCOUNTER — Inpatient Hospital Stay: Payer: Managed Care, Other (non HMO) | Attending: Genetic Counselor

## 2019-04-14 DIAGNOSIS — N920 Excessive and frequent menstruation with regular cycle: Secondary | ICD-10-CM | POA: Insufficient documentation

## 2019-04-14 DIAGNOSIS — D500 Iron deficiency anemia secondary to blood loss (chronic): Secondary | ICD-10-CM | POA: Insufficient documentation

## 2019-04-16 ENCOUNTER — Inpatient Hospital Stay: Payer: Managed Care, Other (non HMO)

## 2019-04-16 ENCOUNTER — Other Ambulatory Visit: Payer: Self-pay

## 2019-04-16 VITALS — BP 114/82 | HR 88 | Temp 97.0°F | Resp 18

## 2019-04-16 DIAGNOSIS — D500 Iron deficiency anemia secondary to blood loss (chronic): Secondary | ICD-10-CM

## 2019-04-16 DIAGNOSIS — K909 Intestinal malabsorption, unspecified: Secondary | ICD-10-CM

## 2019-04-16 DIAGNOSIS — N920 Excessive and frequent menstruation with regular cycle: Secondary | ICD-10-CM | POA: Diagnosis not present

## 2019-04-16 DIAGNOSIS — N921 Excessive and frequent menstruation with irregular cycle: Secondary | ICD-10-CM

## 2019-04-16 MED ORDER — SODIUM CHLORIDE 0.9 % IV SOLN
Freq: Once | INTRAVENOUS | Status: AC
  Filled 2019-04-16: qty 250

## 2019-04-16 MED ORDER — SODIUM CHLORIDE 0.9 % IV SOLN
200.0000 mg | Freq: Once | INTRAVENOUS | Status: AC
  Administered 2019-04-16: 200 mg via INTRAVENOUS
  Filled 2019-04-16: qty 200

## 2019-04-20 ENCOUNTER — Encounter (INDEPENDENT_AMBULATORY_CARE_PROVIDER_SITE_OTHER): Payer: Self-pay

## 2019-04-21 ENCOUNTER — Inpatient Hospital Stay: Payer: Managed Care, Other (non HMO)

## 2019-04-21 ENCOUNTER — Other Ambulatory Visit: Payer: Self-pay

## 2019-04-21 ENCOUNTER — Encounter: Payer: Self-pay | Admitting: Hematology & Oncology

## 2019-04-21 VITALS — BP 126/68 | HR 112 | Temp 97.7°F | Resp 18

## 2019-04-21 DIAGNOSIS — K909 Intestinal malabsorption, unspecified: Secondary | ICD-10-CM

## 2019-04-21 DIAGNOSIS — D500 Iron deficiency anemia secondary to blood loss (chronic): Secondary | ICD-10-CM

## 2019-04-21 DIAGNOSIS — N921 Excessive and frequent menstruation with irregular cycle: Secondary | ICD-10-CM

## 2019-04-21 MED ORDER — SODIUM CHLORIDE 0.9 % IV SOLN
Freq: Once | INTRAVENOUS | Status: AC
  Filled 2019-04-21: qty 250

## 2019-04-21 MED ORDER — SODIUM CHLORIDE 0.9 % IV SOLN
200.0000 mg | Freq: Once | INTRAVENOUS | Status: AC
  Administered 2019-04-21: 200 mg via INTRAVENOUS
  Filled 2019-04-21: qty 200

## 2019-04-21 NOTE — Patient Instructions (Signed)

## 2019-04-28 ENCOUNTER — Inpatient Hospital Stay: Payer: Managed Care, Other (non HMO)

## 2019-04-28 ENCOUNTER — Other Ambulatory Visit: Payer: Self-pay

## 2019-04-28 VITALS — BP 138/83 | HR 100 | Temp 97.7°F | Resp 18

## 2019-04-28 DIAGNOSIS — D500 Iron deficiency anemia secondary to blood loss (chronic): Secondary | ICD-10-CM

## 2019-04-28 DIAGNOSIS — N921 Excessive and frequent menstruation with irregular cycle: Secondary | ICD-10-CM

## 2019-04-28 DIAGNOSIS — K909 Intestinal malabsorption, unspecified: Secondary | ICD-10-CM

## 2019-04-28 DIAGNOSIS — K588 Other irritable bowel syndrome: Secondary | ICD-10-CM

## 2019-04-28 LAB — CBC WITH DIFFERENTIAL (CANCER CENTER ONLY)
Abs Immature Granulocytes: 0.01 10*3/uL (ref 0.00–0.07)
Basophils Absolute: 0.1 10*3/uL (ref 0.0–0.1)
Basophils Relative: 1 %
Eosinophils Absolute: 0.3 10*3/uL (ref 0.0–0.5)
Eosinophils Relative: 7 %
HCT: 33.4 % — ABNORMAL LOW (ref 36.0–46.0)
Hemoglobin: 10.6 g/dL — ABNORMAL LOW (ref 12.0–15.0)
Immature Granulocytes: 0 %
Lymphocytes Relative: 58 %
Lymphs Abs: 2.3 10*3/uL (ref 0.7–4.0)
MCH: 24.4 pg — ABNORMAL LOW (ref 26.0–34.0)
MCHC: 31.7 g/dL (ref 30.0–36.0)
MCV: 76.8 fL — ABNORMAL LOW (ref 80.0–100.0)
Monocytes Absolute: 0.4 10*3/uL (ref 0.1–1.0)
Monocytes Relative: 10 %
Neutro Abs: 1 10*3/uL — ABNORMAL LOW (ref 1.7–7.7)
Neutrophils Relative %: 24 %
Platelet Count: 314 10*3/uL (ref 150–400)
RBC: 4.35 MIL/uL (ref 3.87–5.11)
RDW: 26 % — ABNORMAL HIGH (ref 11.5–15.5)
WBC Count: 4 10*3/uL (ref 4.0–10.5)
nRBC: 0 % (ref 0.0–0.2)

## 2019-04-28 LAB — CMP (CANCER CENTER ONLY)
ALT: 9 U/L (ref 0–44)
AST: 14 U/L — ABNORMAL LOW (ref 15–41)
Albumin: 3.9 g/dL (ref 3.5–5.0)
Alkaline Phosphatase: 44 U/L (ref 38–126)
Anion gap: 5 (ref 5–15)
BUN: 13 mg/dL (ref 6–20)
CO2: 31 mmol/L (ref 22–32)
Calcium: 8.9 mg/dL (ref 8.9–10.3)
Chloride: 105 mmol/L (ref 98–111)
Creatinine: 0.93 mg/dL (ref 0.44–1.00)
GFR, Est AFR Am: >60 mL/min (ref >60)
GFR, Estimated: >60 mL/min (ref >60)
Glucose, Bld: 97 mg/dL (ref 70–99)
Potassium: 3.3 mmol/L — ABNORMAL LOW (ref 3.5–5.1)
Sodium: 141 mmol/L (ref 135–145)
Total Bilirubin: 0.3 mg/dL (ref 0.3–1.2)
Total Protein: 6.7 g/dL (ref 6.5–8.1)

## 2019-04-28 LAB — TSH: TSH: 1.469 u[IU]/mL (ref 0.308–3.960)

## 2019-04-28 MED ORDER — SODIUM CHLORIDE 0.9 % IV SOLN
Freq: Once | INTRAVENOUS | Status: AC
  Filled 2019-04-28: qty 250

## 2019-04-28 MED ORDER — SODIUM CHLORIDE 0.9 % IV SOLN
200.0000 mg | Freq: Once | INTRAVENOUS | Status: AC
  Administered 2019-04-28: 200 mg via INTRAVENOUS
  Filled 2019-04-28: qty 10

## 2019-04-28 NOTE — Patient Instructions (Signed)

## 2019-04-28 NOTE — Progress Notes (Signed)
Patient c/o feeling tired and chronically fatigued. Stays in bed all day. General feeling of "not well". Per dr Marin Olp, have patient report to the lab after her venofer infusion today. Patient aware.

## 2019-04-30 ENCOUNTER — Telehealth: Payer: Self-pay | Admitting: Genetic Counselor

## 2019-04-30 NOTE — Telephone Encounter (Signed)
LM on VM that results are back and to please call.  Left CB instructions. 

## 2019-05-01 ENCOUNTER — Ambulatory Visit: Payer: Self-pay | Admitting: Genetic Counselor

## 2019-05-01 DIAGNOSIS — Z1379 Encounter for other screening for genetic and chromosomal anomalies: Secondary | ICD-10-CM

## 2019-05-01 NOTE — Telephone Encounter (Signed)
Revealed negative genetic testing.  Discussed that we do not know why she has gastric polyps or why there is cancer in the family. It could be due to a different gene that we are not testing, or maybe our current technology may not be able to pick something up.  It will be important for her to keep in contact with genetics to keep up with whether additional testing may be needed.

## 2019-05-01 NOTE — Progress Notes (Signed)
HPI:  [name] was previously seen in the [facility] due to a family history of cancer and personal history of gastric polyps and concerns regarding a hereditary predisposition to cancer. Please refer to our prior cancer genetics clinic note for more information regarding our discussion, assessment and recommendations, at the time. [name] recent genetic test results were disclosed to her, as were recommendations warranted by these results. These results and recommendations are discussed in more detail below.  CANCER HISTORY:  Oncology History   No history exists.    FAMILY HISTORY:  We obtained a detailed, 4-generation family history.  Significant diagnoses are listed below: Family History  Problem Relation Age of Onset  . Diabetes Mother   . Arthritis Mother   . Hyperlipidemia Mother   . Other Mother        VARICOSE VEINS; DECREASED HEART RATE; +Hysterectomy for unspecified reason  . Fibromyalgia Mother   . Colon polyps Mother        >10  . Depression Mother   . Dementia Mother   . Heart disease Father   . Hypertension Father   . Alcohol abuse Father   . Drug abuse Father   . Stroke Father   . Ovarian cancer Sister        dx. <43y  . Cervical cancer Sister 6  . Multiple sclerosis Brother   . Thyroid disease Brother        hypothyroidism  . Lung cancer Maternal Grandmother 74       secondhand smoke  . Depression Maternal Grandmother   . Miscarriages / Stillbirths Maternal Grandmother   . Other Maternal Grandmother        binswanger's disease  . Birth defects Maternal Aunt        EXTRA DIGITS; MUSCULAR DISEASE  . Early death Maternal Aunt 77  . COPD Maternal Grandfather   . Stroke Maternal Grandfather   . Early death Maternal Aunt        d. three days after birth; unknown cause  . Colon polyps Maternal Aunt        unspecified number  . Liver disease Paternal Aunt   . Other Son 25       amelioblastoma of the mouth  . Depression Maternal  Aunt   . Heart attack Paternal Uncle        in mid-50s  . Alcohol abuse Maternal Uncle   . Breast cancer Cousin        maternal    The patient has eight children, her son was diagnosed with ameloblastoma of the jaw at age 69. This was removed and he did not need chemotherapy or radiation.  She has a brother and sister.  Her brother died form complications of MS and her sister had cervical cancer in her 6's, and ovarian and vulvar cancer at 47.  Her father is deceased and her mother is living.  The patient's mother is healthy but reports having greater than 10 polyps on her colonoscopy.  She has three full sisters, one who died of rickets at age 66.  Her parents are deceased.  Her mother had several sisters, one who had breast cancer and had a daughter who had breast cancer, one who had a set of twins who died of colon cancer.    The patient's father was one of 49 children.  He had two sisters who had an unknown cancer.  His parents are deceased from non-cancer related issues.  [name]  is unaware of previous family history of genetic testing for hereditary cancer risks. Patient's maternal ancestors are of African American descent, and paternal ancestors are of African American descent. There is no reported Ashkenazi Jewish ancestry. There is no known consanguinity.    GENETIC TEST RESULTS: Genetic testing reported out on April 29, 2019 through the CancerNext-Expanded+RNAinsight cancer panel found no pathogenic mutations. The CancerNext-Expanded gene panel offered by [facility] and includes sequencing and rearrangement analysis for the following 77 genes: AIP, ALK, APC*, ATM*, AXIN2, BAP1, BARD1, BLM, BMPR1A, BRCA1*, BRCA2*, BRIP1*, CDC73, CDH1*, CDK4, CDKN1B, CDKN2A, CHEK2*, CTNNA1, DICER1, FANCC, FH, FLCN, GALNT12, KIF1B, LZTR1, MAX, MEN1, MET, MLH1*, MSH2*, MSH3, MSH6*, MUTYH*, NBN, NF1*, NF2, NTHL1, PALB2*, PHOX2B, PMS2*, POT1, PRKAR1A, PTCH1, PTEN*, RAD51C*, RAD51D*, RB1, RECQL, RET,  SDHA, SDHAF2, SDHB, SDHC, SDHD, SMAD4, SMARCA4, SMARCB1, SMARCE1, STK11, SUFU, TMEM127, TP53*, TSC1, TSC2, VHL and XRCC2 (sequencing and deletion/duplication); EGFR, EGLN1, HOXB13, KIT, MITF, PDGFRA, POLD1, and POLE (sequencing only); EPCAM and GREM1 (deletion/duplication only). DNA and RNA analyses performed for * genes. The test report has been scanned into EPIC and is located under the Molecular Pathology section of the Results Review tab.  A portion of the result report is included below for reference.     We discussed with [name] that because current genetic testing is not perfect, it is possible there may be a gene mutation in one of these genes that current testing cannot detect, but that chance is small.  We also discussed, that there could be another gene that has not yet been discovered, or that we have not yet tested, that is responsible for the cancer diagnoses in the family. It is also possible there is a hereditary cause for the cancer in the family that [name] did not inherit and therefore was not identified in her testing.  Therefore, it is important to remain in touch with cancer genetics in the future so that we can continue to offer [name] the most up to date genetic testing.   ADDITIONAL GENETIC TESTING: We discussed with [name] that her genetic testing was fairly extensive.  If there are genes identified to increase cancer risk that can be analyzed in the future, we would be happy to discuss and coordinate this testing at that time.    CANCER SCREENING RECOMMENDATIONS: [name] test result is considered negative (normal).  This means that we have not identified a hereditary cause for her family history of cancer at this time. Most cancers happen by chance and this negative test suggests that her cancer may fall into this category.    While reassuring, this does not definitively rule out a hereditary predisposition to cancer. It is still possible that there could be  genetic mutations that are undetectable by current technology. There could be genetic mutations in genes that have not been tested or identified to increase cancer risk.  Therefore, it is recommended she continue to follow the cancer management and screening guidelines provided by her primary healthcare provider.   An individual's cancer risk and medical management are not determined by genetic test results alone. Overall cancer risk assessment incorporates additional factors, including personal medical history, family history, and any available genetic information that may result in a personalized plan for cancer prevention and surveillance   RECOMMENDATIONS FOR FAMILY MEMBERS:  Individuals in this family might be at some increased risk of developing cancer, over the general population risk, simply due to the family history of cancer.  We recommended  women in this family have a yearly mammogram beginning at age 56, or 54 years younger than the earliest onset of cancer, an annual clinical breast exam, and perform monthly breast self-exams. Women in this family should also have a gynecological exam as recommended by their primary provider. All family members should have a colonoscopy by age 8.  FOLLOW-UP: Lastly, we discussed with [name] that cancer genetics is a rapidly advancing field and it is possible that new genetic tests will be appropriate for her and/or her family members in the future. We encouraged her to remain in contact with cancer genetics on an annual basis so we can update her personal and family histories and let her know of advances in cancer genetics that may benefit this family.   Our contact number was provided. [name] questions were answered to her satisfaction, and she knows she is welcome to call us at anytime with additional questions or concerns.   Roma Kayser, [place], [facility] Licensed, Certified Genetic Counselor [email or web address]

## 2019-05-05 ENCOUNTER — Inpatient Hospital Stay: Payer: Managed Care, Other (non HMO)

## 2019-05-06 ENCOUNTER — Other Ambulatory Visit: Payer: Self-pay | Admitting: Family

## 2019-05-07 ENCOUNTER — Inpatient Hospital Stay: Payer: Managed Care, Other (non HMO)

## 2019-05-07 ENCOUNTER — Other Ambulatory Visit: Payer: Self-pay

## 2019-05-07 VITALS — BP 122/89 | HR 61 | Temp 98.0°F | Resp 19

## 2019-05-07 DIAGNOSIS — N921 Excessive and frequent menstruation with irregular cycle: Secondary | ICD-10-CM

## 2019-05-07 DIAGNOSIS — K909 Intestinal malabsorption, unspecified: Secondary | ICD-10-CM

## 2019-05-07 DIAGNOSIS — D500 Iron deficiency anemia secondary to blood loss (chronic): Secondary | ICD-10-CM | POA: Diagnosis not present

## 2019-05-07 MED ORDER — SODIUM CHLORIDE 0.9 % IV SOLN
200.0000 mg | Freq: Once | INTRAVENOUS | Status: AC
  Administered 2019-05-07: 200 mg via INTRAVENOUS
  Filled 2019-05-07: qty 200

## 2019-05-07 MED ORDER — SODIUM CHLORIDE 0.9 % IV SOLN
Freq: Once | INTRAVENOUS | Status: AC
  Filled 2019-05-07: qty 250

## 2019-05-07 NOTE — Patient Instructions (Signed)

## 2019-05-11 ENCOUNTER — Encounter: Payer: Self-pay | Admitting: Hematology & Oncology

## 2019-05-11 ENCOUNTER — Other Ambulatory Visit: Payer: Managed Care, Other (non HMO)

## 2019-05-13 ENCOUNTER — Other Ambulatory Visit: Payer: Self-pay

## 2019-05-13 DIAGNOSIS — D500 Iron deficiency anemia secondary to blood loss (chronic): Secondary | ICD-10-CM

## 2019-05-14 ENCOUNTER — Ambulatory Visit: Payer: Managed Care, Other (non HMO)

## 2019-05-14 ENCOUNTER — Inpatient Hospital Stay: Payer: Managed Care, Other (non HMO) | Attending: Genetic Counselor | Admitting: Hematology & Oncology

## 2019-05-14 ENCOUNTER — Inpatient Hospital Stay: Payer: Managed Care, Other (non HMO)

## 2019-05-14 DIAGNOSIS — D573 Sickle-cell trait: Secondary | ICD-10-CM | POA: Insufficient documentation

## 2019-05-14 DIAGNOSIS — Z9884 Bariatric surgery status: Secondary | ICD-10-CM | POA: Insufficient documentation

## 2019-05-14 DIAGNOSIS — D508 Other iron deficiency anemias: Secondary | ICD-10-CM | POA: Insufficient documentation

## 2019-05-14 DIAGNOSIS — E559 Vitamin D deficiency, unspecified: Secondary | ICD-10-CM | POA: Insufficient documentation

## 2019-05-14 DIAGNOSIS — K909 Intestinal malabsorption, unspecified: Secondary | ICD-10-CM | POA: Insufficient documentation

## 2019-05-14 DIAGNOSIS — Z86718 Personal history of other venous thrombosis and embolism: Secondary | ICD-10-CM | POA: Insufficient documentation

## 2019-05-18 ENCOUNTER — Telehealth: Payer: Self-pay | Admitting: *Deleted

## 2019-05-18 NOTE — Telephone Encounter (Signed)
Received call from patient stating she was very tired, weak and "unable to get up and move".  Patient was a no show for her appointment on 05/14/19.  Patient apparently was rescheduled for 05/26/19.  Patient stated that she cant wait that long for an appointment. Talked to Laverna Peace NP about above.  Stated patient has received 5 iron infusions and it takes up to 6 weeks for optimum levels before we recheck levels.   Reminded patient that she was a no show for her appt on 05/14/19 and she stated that the reason she was a no show was that she was too weak and couldn't get up to come to her appointment.  Stated her children are literally doing everything for her.  Expressed concern for patients symptoms that this sounds severe and that she needs to go to the ED to be evaluated for these symptoms as it could be something other than iron deficiency.  Patient did not sound like this was an option she wanted to pursue.

## 2019-05-21 ENCOUNTER — Other Ambulatory Visit: Payer: Self-pay | Admitting: Orthopedic Surgery

## 2019-05-21 DIAGNOSIS — M5416 Radiculopathy, lumbar region: Secondary | ICD-10-CM

## 2019-05-26 ENCOUNTER — Other Ambulatory Visit: Payer: Self-pay

## 2019-05-26 ENCOUNTER — Encounter: Payer: Self-pay | Admitting: Family

## 2019-05-26 ENCOUNTER — Inpatient Hospital Stay: Payer: Managed Care, Other (non HMO)

## 2019-05-26 ENCOUNTER — Telehealth: Payer: Self-pay | Admitting: Family

## 2019-05-26 ENCOUNTER — Inpatient Hospital Stay (HOSPITAL_BASED_OUTPATIENT_CLINIC_OR_DEPARTMENT_OTHER): Payer: Managed Care, Other (non HMO) | Admitting: Family

## 2019-05-26 VITALS — BP 121/87 | HR 101 | Temp 97.1°F | Resp 18 | Ht 66.0 in | Wt 203.0 lb

## 2019-05-26 DIAGNOSIS — Z86718 Personal history of other venous thrombosis and embolism: Secondary | ICD-10-CM | POA: Diagnosis not present

## 2019-05-26 DIAGNOSIS — E559 Vitamin D deficiency, unspecified: Secondary | ICD-10-CM

## 2019-05-26 DIAGNOSIS — E538 Deficiency of other specified B group vitamins: Secondary | ICD-10-CM

## 2019-05-26 DIAGNOSIS — D508 Other iron deficiency anemias: Secondary | ICD-10-CM | POA: Diagnosis not present

## 2019-05-26 DIAGNOSIS — K9089 Other intestinal malabsorption: Secondary | ICD-10-CM

## 2019-05-26 DIAGNOSIS — Z9884 Bariatric surgery status: Secondary | ICD-10-CM | POA: Diagnosis not present

## 2019-05-26 DIAGNOSIS — D500 Iron deficiency anemia secondary to blood loss (chronic): Secondary | ICD-10-CM

## 2019-05-26 DIAGNOSIS — D573 Sickle-cell trait: Secondary | ICD-10-CM | POA: Diagnosis not present

## 2019-05-26 DIAGNOSIS — K909 Intestinal malabsorption, unspecified: Secondary | ICD-10-CM | POA: Diagnosis not present

## 2019-05-26 LAB — CBC WITH DIFFERENTIAL (CANCER CENTER ONLY)
Abs Immature Granulocytes: 0.01 10*3/uL (ref 0.00–0.07)
Basophils Absolute: 0.1 10*3/uL (ref 0.0–0.1)
Basophils Relative: 2 %
Eosinophils Absolute: 0.2 10*3/uL (ref 0.0–0.5)
Eosinophils Relative: 4 %
HCT: 39.6 % (ref 36.0–46.0)
Hemoglobin: 13.1 g/dL (ref 12.0–15.0)
Immature Granulocytes: 0 %
Lymphocytes Relative: 53 %
Lymphs Abs: 1.8 10*3/uL (ref 0.7–4.0)
MCH: 26 pg (ref 26.0–34.0)
MCHC: 33.1 g/dL (ref 30.0–36.0)
MCV: 78.7 fL — ABNORMAL LOW (ref 80.0–100.0)
Monocytes Absolute: 0.3 10*3/uL (ref 0.1–1.0)
Monocytes Relative: 10 %
Neutro Abs: 1 10*3/uL — ABNORMAL LOW (ref 1.7–7.7)
Neutrophils Relative %: 31 %
Platelet Count: 299 10*3/uL (ref 150–400)
RBC: 5.03 MIL/uL (ref 3.87–5.11)
RDW: 24.5 % — ABNORMAL HIGH (ref 11.5–15.5)
WBC Count: 3.4 10*3/uL — ABNORMAL LOW (ref 4.0–10.5)
nRBC: 0 % (ref 0.0–0.2)

## 2019-05-26 LAB — CMP (CANCER CENTER ONLY)
ALT: 11 U/L (ref 0–44)
AST: 15 U/L (ref 15–41)
Albumin: 4.3 g/dL (ref 3.5–5.0)
Alkaline Phosphatase: 47 U/L (ref 38–126)
Anion gap: 8 (ref 5–15)
BUN: 15 mg/dL (ref 6–20)
CO2: 27 mmol/L (ref 22–32)
Calcium: 9.8 mg/dL (ref 8.9–10.3)
Chloride: 104 mmol/L (ref 98–111)
Creatinine: 1.01 mg/dL — ABNORMAL HIGH (ref 0.44–1.00)
GFR, Est AFR Am: >60 mL/min (ref >60)
GFR, Estimated: >60 mL/min (ref >60)
Glucose, Bld: 100 mg/dL — ABNORMAL HIGH (ref 70–99)
Potassium: 3.6 mmol/L (ref 3.5–5.1)
Sodium: 139 mmol/L (ref 135–145)
Total Bilirubin: 0.6 mg/dL (ref 0.3–1.2)
Total Protein: 6.9 g/dL (ref 6.5–8.1)

## 2019-05-26 LAB — RETICULOCYTES
Immature Retic Fract: 8.7 % (ref 2.3–15.9)
RBC.: 5.04 MIL/uL (ref 3.87–5.11)
Retic Count, Absolute: 61.5 10*3/uL (ref 19.0–186.0)
Retic Ct Pct: 1.2 % (ref 0.4–3.1)

## 2019-05-26 LAB — VITAMIN D 25 HYDROXY (VIT D DEFICIENCY, FRACTURES): Vit D, 25-Hydroxy: 62.98 ng/mL (ref 30–100)

## 2019-05-26 MED ORDER — BARIATRIC MULTIVITAMINS/IRON PO CAPS: 1.0000 | ORAL_CAPSULE | Freq: Every day | ORAL | 6 refills | Status: DC

## 2019-05-26 NOTE — Telephone Encounter (Signed)
Appointments scheduled I will call patient once all labs are in per 2/16 los

## 2019-05-26 NOTE — Progress Notes (Signed)
Hematology and Oncology Follow Up Visit  Tiffany [name] [id number] 1975-11-27 44 y.o. 05/26/2019   Principle Diagnosis:  Iron deficiency anemia secondary to malabsorption (lap band surgery 2009) and heavy cycles Sickle cell trait Vitamin D deficiency  History of left lower extremity DVT - resolved   Current Therapy:   IV iron as indicated  Folic acid 2 mg PO daily Vitamin D 50,000 units PO once a week    Interim History:  Tiffany [name] is here today for follow-up. She has received 5 doses of Venofer over the last few weeks. Hgb is up to 13.1 from previous 10.6. MCV is also improved at 78.7.  She is still symptomatic with fatigue, lightheadedness, SOB with exertion, occasional palpitations and chest discomfort, and brain fog.  She has intermittent numbness and tingling in her hands and feet.  The swelling in her feet and ankles waxes and wanes. She notes this more when she is up on her feet for an extended period of time.  She has generalized joint aches and pains associated with her fibromyalgia. She may also be experiencing some chronic fatigue also associated with her fibromyalgia.  She states that she has not had a cycle since November 2020.  She will occasionally have bleeding from her gums when she flosses her teeth.  She bruises easily but not in excess. No petechiae.  No fever, chills, cough, rash, abdominal pain or changes in bowel habits.  She has episodes of urinary frequency. Her appetite comes and goes. She has occasional episodes of n/v that she has not been able to associate with a trigger. She does feel that she is hydrating appropriately. Her weight is stable.   ECOG Performance Status: 1 - Symptomatic but completely ambulatory  Medications:  Allergies as of 05/26/2019      Reactions   Flexeril [cyclobenzaprine Hcl] Shortness Of Breath, Itching, Swelling, Rash   Swelling of throat   Latex Shortness Of Breath, Itching, Rash   Lamotrigine Rash   Seasonal Ic   [cholestatin] Other (See Comments)   Other Other (See Comments)   RUBBER      Medication List       Accurate as of May 26, 2019  1:25 PM. If you have any questions, ask your nurse or doctor.        acetaminophen 500 MG tablet Commonly known as: TYLENOL Take 1 tablet (500 mg total) by mouth every 12 (twelve) hours.   amphetamine-dextroamphetamine 30 MG tablet Commonly known as: ADDERALL Take 30 mg by mouth 2 (two) times daily. Morning and noon   desloratadine 5 MG tablet Commonly known as: CLARINEX Take 5 mg by mouth daily.   fluticasone 50 MCG/ACT nasal spray Commonly known as: FLONASE Place 1 spray into both nostrils daily as needed for allergies.   folic acid 1 MG tablet Commonly known as: FOLVITE Take 2 tablets (2 mg total) by mouth daily.   lubiprostone 24 MCG capsule Commonly known as: Amitiza Take 1 capsule (24 mcg total) by mouth daily with breakfast. What changed: additional instructions   pregabalin 100 MG capsule Commonly known as: LYRICA Take 100 mg by mouth 2 (two) times daily.   Spravato (56 MG Dose) 28 MG/DEVICE Sopk Generic drug: Esketamine HCl (56 MG Dose) Place 2 sprays into the nose every 14 (fourteen) days.   Vitamin D3 1.25 MG (50000 UT) Caps Take 1 capsule by mouth once a week.   Xanax 1 MG tablet Generic drug: ALPRAZolam Take 1 mg by mouth at bedtime  as needed for anxiety.   zolpidem 5 MG tablet Commonly known as: AMBIEN Take 5 mg by mouth at bedtime.       Allergies:  Allergies  Allergen Reactions  . Flexeril [Cyclobenzaprine Hcl] Shortness Of Breath, Itching, Swelling and Rash    Swelling of throat  . Latex Shortness Of Breath, Itching and Rash  . Lamotrigine Rash  . Seasonal Ic  [Cholestatin] Other (See Comments)  . Other Other (See Comments)    RUBBER    Past Medical History, Surgical history, Social history, and Family History were reviewed and updated.  Review of Systems: All other 10 point review of systems is  negative.   Physical Exam:  vitals were not taken for this visit.   Wt Readings from Last 3 Encounters:  04/02/19 205 lb (93 kg)  03/17/19 201 lb (91.2 kg)  03/10/19 201 lb 6.4 oz (91.4 kg)    Ocular: Sclerae unicteric, pupils equal, round and reactive to light Ear-nose-throat: Oropharynx clear, dentition fair Lymphatic: No cervical or supraclavicular adenopathy Lungs no rales or rhonchi, good excursion bilaterally Heart regular rate and rhythm, no murmur appreciated Abd soft, nontender, positive bowel sounds, no liver or spleen tip palpated on exam, no fluid wave  MSK no focal spinal tenderness, no joint edema Neuro: non-focal, well-oriented, appropriate affect Breasts: Deferred   Lab Results  Component Value Date   WBC 4.0 04/28/2019   HGB 10.6 (L) 04/28/2019   HCT 33.4 (L) 04/28/2019   MCV 76.8 (L) 04/28/2019   PLT 314 04/28/2019   Lab Results  Component Value Date   FERRITIN 5 (L) 04/02/2019   IRON 30 (L) 04/02/2019   TIBC 404 04/02/2019   UIBC 373 04/02/2019   IRONPCTSAT 8 (L) 04/02/2019   Lab Results  Component Value Date   RETICCTPCT 1.3 04/02/2019   RBC 4.35 04/28/2019   No results found for: KPAFRELGTCHN, LAMBDASER, KAPLAMBRATIO No results found for: IGGSERUM, IGA, IGMSERUM No results found for: Odetta Pink, SPEI   Chemistry      Component Value Date/Time   NA 141 04/28/2019 0958   NA 138 03/10/2019 1019   K 3.3 (L) 04/28/2019 0958   CL 105 04/28/2019 0958   CO2 31 04/28/2019 0958   BUN 13 04/28/2019 0958   BUN 8 03/10/2019 1019   CREATININE 0.93 04/28/2019 0958   CREATININE 0.75 06/10/2018 1013      Component Value Date/Time   CALCIUM 8.9 04/28/2019 0958   CALCIUM 8.7 12/26/2018 0835   ALKPHOS 44 04/28/2019 0958   AST 14 (L) 04/28/2019 0958   ALT 9 04/28/2019 0958   BILITOT 0.3 04/28/2019 0958       Impression and Plan: Tiffany [name] is a 44 yo African American female with history of iron  deficiency anemia, vitamin D deficiency and sickle cell trait. She has malabsorption due to previous lap band surgery.  We will see how her iron studies and vitamin D level look and replace if needed.  We will go ahead and get her onto a daily bariatric multivitamin. We discussed foods to eat that would potential help with her counts (iron and protein rich).  We will go ahead and plan to see her back in another 6 weeks.  She will be sending FMLA papers for [place] to fill out so she can miss work for her infusions. We do not want her to put her job in jeopardy.   She will contact our office with any questions  or concerns. We can certainly see her sooner if needed.   Laverna Peace, NP 2/16/20211:25 PM

## 2019-05-27 ENCOUNTER — Telehealth: Payer: Self-pay | Admitting: *Deleted

## 2019-05-27 LAB — IRON AND TIBC
Iron: 98 ug/dL (ref 41–142)
Saturation Ratios: 33 % (ref 21–57)
TIBC: 300 ug/dL (ref 236–444)
UIBC: 202 ug/dL (ref 120–384)

## 2019-05-27 LAB — FERRITIN: Ferritin: 111 ng/mL (ref 11–307)

## 2019-05-27 NOTE — Telephone Encounter (Signed)
Call from Judson Roch, [place] requesting records for pt as she would like a 2nd opinion and of transfer care.

## 2019-05-28 ENCOUNTER — Encounter: Payer: Self-pay | Admitting: *Deleted

## 2019-05-28 ENCOUNTER — Telehealth: Payer: Self-pay | Admitting: Hematology & Oncology

## 2019-05-28 NOTE — Telephone Encounter (Signed)
Medical Records faxed to: [facility]  CANCER INST F: [id number] P: [id number] [phone]      COPY SCANNED

## 2019-05-29 ENCOUNTER — Other Ambulatory Visit: Payer: Self-pay | Admitting: Family

## 2019-05-29 DIAGNOSIS — Z9884 Bariatric surgery status: Secondary | ICD-10-CM

## 2019-05-29 DIAGNOSIS — E559 Vitamin D deficiency, unspecified: Secondary | ICD-10-CM

## 2019-05-29 DIAGNOSIS — K9089 Other intestinal malabsorption: Secondary | ICD-10-CM

## 2019-05-29 DIAGNOSIS — E538 Deficiency of other specified B group vitamins: Secondary | ICD-10-CM

## 2019-05-29 DIAGNOSIS — K909 Intestinal malabsorption, unspecified: Secondary | ICD-10-CM

## 2019-05-29 MED ORDER — BARIATRIC MULTIVITAMINS/IRON PO CAPS: 1.0000 | ORAL_CAPSULE | Freq: Every day | ORAL | 6 refills | Status: DC

## 2019-06-08 ENCOUNTER — Telehealth: Payer: Self-pay | Admitting: Obstetrics and Gynecology

## 2019-06-08 NOTE — Telephone Encounter (Signed)
Left message to call [name], RN at [place].   Last AEX 03/10/19

## 2019-06-08 NOTE — Telephone Encounter (Signed)
Patient would like to come in to have her hormone levels checked.

## 2019-06-08 NOTE — Telephone Encounter (Signed)
Spoke with patient. Patient reports increase in hot flashes and mood changes since AEX 03/10/19. No menses since 02/2019. Patient is requesting an OV for hormone labs. Denies any other GYN symptoms. OV scheduled for 3/8 at 11:30am with Dr. Talbert Nan. Patient declined earlier OV offered.   Routing to provider for final review. Patient is agreeable to disposition. Will close encounter.

## 2019-06-09 ENCOUNTER — Ambulatory Visit
Admission: RE | Admit: 2019-06-09 | Discharge: 2019-06-09 | Disposition: A | Discharge status: Home / Self Care | Payer: Managed Care, Other (non HMO) | Source: Ambulatory Visit | Attending: Orthopedic Surgery | Admitting: Orthopedic Surgery

## 2019-06-09 ENCOUNTER — Telehealth: Payer: Self-pay | Admitting: Obstetrics and Gynecology

## 2019-06-09 ENCOUNTER — Other Ambulatory Visit: Payer: Self-pay

## 2019-06-09 DIAGNOSIS — M5416 Radiculopathy, lumbar region: Secondary | ICD-10-CM

## 2019-06-09 NOTE — Telephone Encounter (Signed)
Spoke with patient. Patient request to schedule hormone labs. Advised patient we spoke on 3/1 (see telephone encounter), OV scheduled for 3/8 at 11:30am for consult and labs, if needed. Patient states she forgot we spoke, declined earlier appt, has a funeral this week to attend. Will keep OV as scheduled. Patient thankful for call.  Routing to provider for final review. Patient is agreeable to disposition. Will close encounter.

## 2019-06-09 NOTE — Telephone Encounter (Signed)
Patient is returning call to Jill, RN.  °

## 2019-06-12 ENCOUNTER — Other Ambulatory Visit: Payer: Self-pay | Admitting: Hematology & Oncology

## 2019-06-12 ENCOUNTER — Telehealth: Payer: Self-pay | Admitting: *Deleted

## 2019-06-12 ENCOUNTER — Encounter: Payer: Self-pay | Admitting: *Deleted

## 2019-06-12 DIAGNOSIS — I82402 Acute embolism and thrombosis of unspecified deep veins of left lower extremity: Secondary | ICD-10-CM

## 2019-06-12 NOTE — Telephone Encounter (Signed)
Received call from [facility] PA in [place] system after seeing patient in office.  D dimer was elevated at [facility] visit and she wanted Dr Dicie Beam advice on how to manage this.  Orders placed by Dr. Marin Olp for Ct chest Angiogram and Left leg doppler.  Called Chelle to let her know.  Very appreciative of this help in this case.

## 2019-06-15 ENCOUNTER — Ambulatory Visit: Payer: Self-pay | Admitting: Obstetrics and Gynecology

## 2019-06-15 ENCOUNTER — Ambulatory Visit (HOSPITAL_BASED_OUTPATIENT_CLINIC_OR_DEPARTMENT_OTHER)
Admission: RE | Admit: 2019-06-15 | Discharge: 2019-06-15 | Disposition: A | Discharge status: Home / Self Care | Payer: Managed Care, Other (non HMO) | Source: Ambulatory Visit | Attending: Hematology & Oncology | Admitting: Hematology & Oncology

## 2019-06-15 ENCOUNTER — Telehealth: Payer: Self-pay | Admitting: *Deleted

## 2019-06-15 ENCOUNTER — Encounter (HOSPITAL_BASED_OUTPATIENT_CLINIC_OR_DEPARTMENT_OTHER): Payer: Self-pay

## 2019-06-15 ENCOUNTER — Other Ambulatory Visit: Payer: Self-pay

## 2019-06-15 DIAGNOSIS — I82402 Acute embolism and thrombosis of unspecified deep veins of left lower extremity: Secondary | ICD-10-CM

## 2019-06-15 MED ORDER — IOHEXOL 350 MG/ML SOLN
100.0000 mL | Freq: Once | INTRAVENOUS | Status: AC | PRN
  Administered 2019-06-15: 14:00:00 100 mL via INTRAVENOUS

## 2019-06-15 NOTE — Telephone Encounter (Signed)
-----   Message from [name], MD sent at 06/15/2019  3:26 PM EST ----- Call - no blood clot in the legs!!  Tiffany [name]

## 2019-06-15 NOTE — Telephone Encounter (Signed)
-----   Message from [name], MD sent at 06/15/2019  3:25 PM EST ----- Call - no blood clot in the lungs!!!  Tiffany [name]

## 2019-06-15 NOTE — Telephone Encounter (Signed)
As noted below by Dr. Marin Olp, I informed her that there was no blood clots in her lungs. She verbalized understanding.

## 2019-06-15 NOTE — Telephone Encounter (Signed)
As noted below by Dr. Marin Olp, I informed the patient that there were no blood clots in her legs. She verbalized understanding.

## 2019-06-18 ENCOUNTER — Encounter: Payer: Self-pay | Admitting: Registered"

## 2019-06-18 ENCOUNTER — Other Ambulatory Visit: Payer: Self-pay

## 2019-06-18 ENCOUNTER — Ambulatory Visit: Payer: Managed Care, Other (non HMO) | Admitting: Obstetrics and Gynecology

## 2019-06-18 ENCOUNTER — Encounter: Payer: Managed Care, Other (non HMO) | Attending: Physician Assistant | Admitting: Registered"

## 2019-06-18 ENCOUNTER — Encounter: Payer: Self-pay | Admitting: Obstetrics and Gynecology

## 2019-06-18 VITALS — BP 128/70 | HR 114 | Temp 97.9°F | Ht 66.0 in | Wt 211.0 lb

## 2019-06-18 DIAGNOSIS — R635 Abnormal weight gain: Secondary | ICD-10-CM

## 2019-06-18 DIAGNOSIS — R5383 Other fatigue: Secondary | ICD-10-CM | POA: Diagnosis not present

## 2019-06-18 DIAGNOSIS — N912 Amenorrhea, unspecified: Secondary | ICD-10-CM | POA: Diagnosis not present

## 2019-06-18 DIAGNOSIS — E46 Unspecified protein-calorie malnutrition: Secondary | ICD-10-CM | POA: Diagnosis present

## 2019-06-18 DIAGNOSIS — N951 Menopausal and female climacteric states: Secondary | ICD-10-CM

## 2019-06-18 DIAGNOSIS — Z713 Dietary counseling and surveillance: Secondary | ICD-10-CM | POA: Insufficient documentation

## 2019-06-18 MED ORDER — MEDROXYPROGESTERONE ACETATE 5 MG PO TABS: 5.0000 mg | ORAL_TABLET | Freq: Every day | ORAL | 0 refills | Status: DC

## 2019-06-18 NOTE — Progress Notes (Signed)
GYNECOLOGY  VISIT   HPI: 44 y.o.   Single Black or African American Hispanic or Latino  female   [phone] with Patient's last menstrual period was 03/07/2019.   here for a consult about hot flashes and mood changes.    She has a h/o menorrhagia leading to anemia and severe dysmenorrhea. She had an endometrial biopsy in 12/20 that returned with weakly proliferative endometrium.  Ultrasound showed multiple small myomas and likely adenomyosis. Up until 11/20 she was having monthly cycles. Then her cycles just stopped. She hasn't been sexually active in over a year.   She c/o hot flashes and night sweats starting in the last month. She is getting ~2 hot flashes a day, night sweats ~ 2 x a week. So far her symptoms are tolerable. Not sleeping great, even with ambien and anxiety medication. She is practicing good sleep hygiene. Having trouble falling asleep more than waking up.  No galactorrhea. She does c/o weight gain (50 lbs in the last year), fatigue, hair loss.   Her fatigue and brain fog have improved with correction of her anemia.   She was seen at [facility] for a second opinion with hematology. Her D-Dimer was high, no evidence of a clot.   She has a h/o DVT and PE.  H/O fragility fractures. She has seen Endocrinology. Needs to go back.   FH of Breast, ovarian and colon cancer. Negative genetic testing.   GYNECOLOGIC HISTORY: Patient's last menstrual period was 03/07/2019. Contraception: none Menopausal hormone therapy: none         OB History    Gravida  12   Para  6   Term  6   Preterm      AB  6   Living  6     SAB  6   TAB      Ectopic      Multiple      Live Births  6              Patient Active Problem List   Diagnosis Date Noted  . Iron deficiency anemia due to chronic blood loss 04/02/2019  . Menometrorrhagia 04/02/2019  . Iron malabsorption 04/02/2019  . Family history of ovarian cancer   . Family history of colonic polyps   . Personal history of  colonic polyps   . Acetabulum fracture, right ([place]) 12/26/2018  . Conversion disorder 10/06/2018  . Complaints of total body pain 10/06/2018  . Suspected COVID-19 virus infection 07/28/2018  . Cognitive changes 07/03/2018  . Attention deficit disorder 07/03/2018  . Cyst of ovary 06/10/2018  . Weight loss 06/10/2018  . Neck pain 06/10/2018  . Night sweats 06/10/2018  . Fatigue 06/10/2018  . Nausea and vomiting 06/10/2018  . White matter abnormality on MRI of brain 06/09/2018  . ASCUS with positive high risk HPV cervical 12/17/2017  . Anemia 11/27/2017  . Palpitations 11/28/2016  . Vitamin D deficiency 11/28/2016  . Abdominal bloating 12/27/2015  . Abnormal weight gain 12/23/2015  . Peripheral edema 11/22/2015  . Atypical chest pain 11/10/2015  . Gastric polyps 11/10/2015  . Hypokalemia 08/15/2015  . Seasonal allergic rhinitis due to pollen 07/05/2015  . Sickle cell trait ([place]) 07/05/2015  . Genetic testing 06/13/2015  . Family history of breast cancer 06/02/2015  . Family history of malignant neoplasm of ovary 06/02/2015  . BV (bacterial vaginosis) 02/18/2015  . Hemorrhoids 02/18/2015  . Fibrositis 11/30/2014  . Left leg DVT ([place]) 11/18/2014  . History of DVT of  lower extremity 11/18/2014  . Left leg pain 11/09/2014  . History of laparoscopic adjustable gastric banding 01/29/2012  . Depression 12/19/2011  . Fibromyalgia 12/19/2011  . Arthritis 12/19/2011  . Irritable bowel syndrome 12/19/2011  . Gastroesophageal reflux disease 12/19/2011  . Uterine leiomyoma 12/19/2011  . Current moderate episode of major depressive disorder without prior episode ([place]) 12/19/2011  . Undifferentiated inflammatory arthritis ([place]) 12/19/2011  . Abnormal cervical Papanicolaou smear 04/10/1995    Past Medical History:  Diagnosis Date  . Abnormal Pap smear 1997   LAST PAP 10/2010  . Allergy    SEASONAL  . Anemia    CHRONIC  . 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 Arthritis   . Blood  dyscrasia    SICKLE CELL TRAIT  . Blood transfusion without reported diagnosis 2005   after vaginal delivery  . Breast mass in female 2003  . Broken foot 03/16/2016   right  . Bruises easily   . BV (bacterial vaginosis)   . Cancer ([place])   . Cyst of ovary 2003  . Depression 2003  . DUB (dysfunctional uterine bleeding) 2003  . DVT, lower extremity ([place]) 10/2014   left leg  . Family history of breast cancer   . Family history of colonic polyps   . Family history of ovarian cancer   . Fatigue 06/10/2018  . Fibroid    LONG TERM DX  . Fibromyalgia 2004  . Fibrositis   . GERD (gastroesophageal reflux disease) 2007  . H/O amenorrhea 2006  . H/O menorrhagia 2011  . H/O sickle cell trait   . Heart murmur 2007  . History of abnormal cervical Pap smear 2003   had colposcopy and cryo.  Follow up paps are normal.  . Hx: UTI (urinary tract infection) 2003  . Hypertension 2005   RESOLVED WITH WT LOSS  . IBD (inflammatory bowel disease)   . IBS (irritable bowel syndrome)   . Infection    OCC YEAST  . Iron deficiency anemia due to chronic blood loss 04/02/2019  . Iron malabsorption 04/02/2019  . Low back pain 2003  . Menometrorrhagia 04/02/2019  . Menses, irregular 2003  . Migraine   . Nausea and vomiting 06/10/2018  . Neck pain 06/10/2018  . Night sweats 06/10/2018  . Obese 2003  . Personal history of colonic polyps   . Pulmonary embolism ([place]) 10/2014  . Sacroiliitis ([place])   . Sickle-cell trait ([place])   . Suspected COVID-19 virus infection 07/28/2018  . Trichomonas   . Uterine leiomyoma   . Vaginitis and vulvovaginitis 2004  . Weight loss 06/10/2018  . Yeast vaginitis 04/24/2010    Past Surgical History:  Procedure Laterality Date  . AUGMENTATION MAMMAPLASTY  [id number]   Silicone implants  . broken ankle Right   . CERVICAL CONE BIOPSY  1997  . COSMETIC SURGERY  2012   thigh lift  . CYSTECTOMY Right    obtain date from patient, was not on history form dated 06/16/10--benign cyst removed  Rt.ovary  . DILATION AND CURETTAGE OF UTERUS    . LAPAROSCOPIC GASTRIC BANDING  05/20/2007  . ORIF ACETABULAR FRACTURE Right 12/25/2018   Procedure: OPEN REDUCTION INTERNAL FIXATION (ORIF) ACETABULAR FRACTURE;  Surgeon: Altamese Bee Ridge, MD;  Location: [facility];  Service: Orthopedics;  Laterality: Right;  . WISDOM TOOTH EXTRACTION      Current Outpatient Medications  Medication Sig Dispense Refill  . ALPRAZolam (XANAX) 1 MG tablet Take 1 mg by mouth at bedtime as needed  for anxiety.    [name] amphetamine-dextroamphetamine (ADDERALL) 30 MG tablet Take 30 mg by mouth 2 (two) times daily. Morning and noon    . Cholecalciferol (VITAMIN D3) 1.25 MG (50000 UT) CAPS Take 1 capsule by mouth once a week. 12 capsule 8  . desloratadine (CLARINEX) 5 MG tablet Take 5 mg by mouth daily.    . folic acid (FOLVITE) 1 MG tablet Take 2 tablets (2 mg total) by mouth daily. 60 tablet 12  . gabapentin (NEURONTIN) 100 MG capsule Take 100 mg by mouth at bedtime.    . Multiple Vitamins-Minerals (BARIATRIC MULTIVITAMINS/IRON) CAPS Take 1 capsule by mouth daily. 30 capsule 6  . pantoprazole (PROTONIX) 40 MG tablet Take 40 mg by mouth daily.    . potassium chloride SA (KLOR-CON) 20 MEQ tablet Take 20 mEq by mouth daily.    [name] SPRAVATO, 56 MG DOSE, 28 MG/DEVICE SOPK Place 2 sprays into the nose every 14 (fourteen) days.    [name] topiramate (TOPAMAX) 100 MG tablet Take 100 mg by mouth 2 (two) times daily.    [name] zolpidem (AMBIEN) 5 MG tablet Take 5 mg by mouth at bedtime.     . gabapentin (NEURONTIN) 100 MG capsule Take 100 mg by mouth in the morning, at noon, and at bedtime.    . TRINTELLIX 20 MG TABS tablet Take 20 mg by mouth daily.     No current facility-administered medications for this visit.     ALLERGIES: Flexeril [cyclobenzaprine hcl], Latex, Seasonal ic  [cholestatin], and Other  Family History  Problem Relation Age of Onset  . Diabetes Mother   . Arthritis Mother   . Hyperlipidemia Mother   . Other Mother         VARICOSE VEINS; DECREASED HEART RATE; +Hysterectomy for unspecified reason  . Fibromyalgia Mother   . Colon polyps Mother        >10  . Depression Mother   . Dementia Mother   . Heart disease Father   . Hypertension Father   . Alcohol abuse Father   . Drug abuse Father   . Stroke Father   . Ovarian cancer Sister        dx. <43y  . Cervical cancer Sister 11  . Multiple sclerosis Brother   . Thyroid disease Brother        hypothyroidism  . Lung cancer Maternal Grandmother 74       secondhand smoke  . Depression Maternal Grandmother   . Miscarriages / Stillbirths Maternal Grandmother   . Other Maternal Grandmother        binswanger's disease  . Birth defects Maternal Aunt        EXTRA DIGITS; MUSCULAR DISEASE  . Early death Maternal Aunt 58  . COPD Maternal Grandfather   . Stroke Maternal Grandfather   . Early death Maternal Aunt        d. three days after birth; unknown cause  . Colon polyps Maternal Aunt        unspecified number  . Liver disease Paternal Aunt   . Other Son 25       amelioblastoma of the mouth  . Depression Maternal Aunt   . Heart attack Paternal Uncle        in mid-50s  . Alcohol abuse Maternal Uncle   . Breast cancer Cousin        maternal    Social History   Socioeconomic History  . Marital status: Single    Spouse name: Not on  file  . Number of children: 8  . Years of education: 75  . Highest education level: Not on file  Occupational History  . Occupation: CLINICAL RESEARCH  Tobacco Use  . Smoking status: Never Smoker  . Smokeless tobacco: Never Used  Substance and Sexual Activity  . Alcohol use: Yes    Alcohol/week: 1.0 standard drinks    Types: 1 Glasses of wine per week    Comment: maybe once or twice every 6 months  . Drug use: No  . Sexual activity: Not Currently    Partners: Male    Birth control/protection: Abstinence  Other Topics Concern  . Not on file  Social History Narrative   Lives at home with 4 of her 8 children    Right handed   Social Determinants of Health   Financial Resource Strain:   . Difficulty of Paying Living Expenses:   Food Insecurity:   . Worried About Charity fundraiser in the Last Year:   . Arboriculturist in the Last Year:   Transportation Needs:   . Film/video editor (Medical):   [name] Lack of Transportation (Non-Medical):   Physical Activity:   . Days of Exercise per Week:   . Minutes of Exercise per Session:   Stress:   . Feeling of Stress :   Social Connections:   . Frequency of Communication with Friends and Family:   . Frequency of Social Gatherings with Friends and Family:   . Attends Religious Services:   . Active Member of Clubs or Organizations:   . Attends Archivist Meetings:   [name] Marital Status:   Intimate Partner Violence:   . Fear of Current or Ex-Partner:   . Emotionally Abused:   [name] Physically Abused:   . Sexually Abused:     Review of Systems  All other systems reviewed and are negative.   PHYSICAL EXAMINATION:    BP 128/70   Pulse (!) 114   Temp 97.9 F (36.6 C)   Ht 5\' 6"  (1.676 m)   Wt 211 lb (95.7 kg)   LMP 03/07/2019   SpO2 98%   BMI 34.06 kg/m     General appearance: alert, cooperative and appears stated age  ASSESSMENT Amenorrhea x 4 months Vasomotor symptoms, so far tolerable H/O menorrhagia leading to anemia. She has a few small intramural/subserosal myomas and likely adenomyosis. H/O DVT/PE H/O fragility fracture    PLAN FSH, TSH, Prolactin Provera 5 mg x 5 days, reviewed with the patient that she may have a heavy bleed, call if heavier than normal for her (already extremely heavy) We discussed behavior changes for vasomotor symptoms and avoiding triggers She is already on Gabapentin She will f/u with hematology She will f/u with Endocrinology for h/o fragility fractures.     An After Visit Summary was printed and given to the patient.  Over 35 minutes spent in total patient care

## 2019-06-18 NOTE — Patient Instructions (Signed)
Perimenopause  Perimenopause is the normal time of life before and after menstrual periods stop completely (menopause). Perimenopause can begin 2-8 years before menopause, and it usually lasts for 1 year after menopause. During perimenopause, the ovaries may or may not produce an egg. What are the causes? This condition is caused by a natural change in hormone levels that happens as you get older. What increases the risk? This condition is more likely to start at an earlier age if you have certain medical conditions or treatments, including:  A tumor of the pituitary gland in the brain.  A disease that affects the ovaries and hormone production.  Radiation treatment for cancer.  Certain cancer treatments, such as chemotherapy or hormone (anti-estrogen) therapy.  Heavy smoking and excessive alcohol use.  Family history of early menopause. What are the signs or symptoms? Perimenopausal changes affect each woman differently. Symptoms of this condition may include:  Hot flashes.  Night sweats.  Irregular menstrual periods.  Decreased sex drive.  Vaginal dryness.  Headaches.  Mood swings.  Depression.  Memory problems or trouble concentrating.  Irritability.  Tiredness.  Weight gain.  Anxiety.  Trouble getting pregnant. How is this diagnosed? This condition is diagnosed based on your medical history, a physical exam, your age, your menstrual history, and your symptoms. Hormone tests may also be done. How is this treated? In some cases, no treatment is needed. You and your health care provider should make a decision together about whether treatment is necessary. Treatment will be based on your individual condition and preferences. Various treatments are available, such as:  Menopausal hormone therapy (MHT).  Medicines to treat specific symptoms.  Acupuncture.  Vitamin or herbal supplements. Before starting treatment, make sure to let your health care provider  know if you have a personal or family history of:  Heart disease.  Breast cancer.  Blood clots.  Diabetes.  Osteoporosis. Follow these instructions at home: Lifestyle  Do not use any products that contain nicotine or tobacco, such as cigarettes and e-cigarettes. If you need help quitting, ask your health care provider.  Eat a balanced diet that includes fresh fruits and vegetables, whole grains, soybeans, eggs, lean meat, and low-fat dairy.  Get at least 30 minutes of physical activity on 5 or more days each week.  Avoid alcoholic and caffeinated beverages, as well as spicy foods. This may help prevent hot flashes.  Get 7-8 hours of sleep each night.  Dress in layers that can be removed to help you manage hot flashes.  Find ways to manage stress, such as deep breathing, meditation, or journaling. General instructions  Keep track of your menstrual periods, including: ? When they occur. ? How heavy they are and how long they last. ? How much time passes between periods.  Keep track of your symptoms, noting when they start, how often you have them, and how long they last.  Take over-the-counter and prescription medicines only as told by your health care provider.  Take vitamin supplements only as told by your health care provider. These may include calcium, vitamin E, and vitamin D.  Use vaginal lubricants or moisturizers to help with vaginal dryness and improve comfort during sex.  Talk with your health care provider before starting any herbal supplements.  Keep all follow-up visits as told by your health care provider. This is important. This includes any group therapy or counseling. Contact a health care provider if:  You have heavy vaginal bleeding or pass blood clots.  Your period  lasts more than 2 days longer than normal.  Your periods are recurring sooner than 21 days.  You bleed after having sex. Get help right away if:  You have chest pain, trouble  breathing, or trouble talking.  You have severe depression.  You have pain when you urinate.  You have severe headaches.  You have vision problems. Summary  Perimenopause is the time when a woman's body begins to move into menopause. This may happen naturally or as a result of other health problems or medical treatments.  Perimenopause can begin 2-8 years before menopause, and it usually lasts for 1 year after menopause.  Perimenopausal symptoms can be managed through medicines, lifestyle changes, and complementary therapies such as acupuncture. This information is not intended to replace advice given to you by your health care provider. Make sure you discuss any questions you have with your health care provider. Document Revised: 03/08/2017 Document Reviewed: 05/01/2016 Elsevier Patient Education  2020 [place]. Secondary Amenorrhea  Secondary amenorrhea occurs when a female who was previously having menstrual periods has not had them for 3-6 months. A menstrual period is the monthly shedding of the lining of the uterus. Menstruation involves the passing of blood, tissue, fluid, and mucus through the vagina. The flow of blood usually occurs during 3-7 consecutive days each month. This condition has many causes. In many cases, treating the underlying cause will return menstrual periods back to a normal cycle. What are the causes? The most common cause of this condition is pregnancy. Other causes include:  Malnutrition.  Cirrhosis of the liver.  Conditions of the blood.  Diabetes.  Epilepsy.  Chronic kidney disease.  Polycystic ovary disease.  Stress or anxiety.  A hormonal imbalance.  Ovarian failure.  Medicines.  Extreme obesity.  Cystic fibrosis.  Low body weight or drastic weight loss.  Early menopause.  Removal of the ovaries or uterus.  Contraceptive pills, patches, or vaginal rings.  Cushing syndrome.  Thyroid problems. What increases the  risk? You are more likely to develop this condition if:  You have a family history of this condition.  You have an eating disorder.  You do extreme athletic training.  You have a chronic disease.  You abuse substances such as alcohol or cigarettes. What are the signs or symptoms? The main symptom of this condition is a lack of menstrual periods for 3-6 months. How is this diagnosed? This condition may be diagnosed based on:  Your medical history.  A physical exam.  A pelvic exam to check for problems with your reproductive organs.  A procedure to examine the uterus.  A measurement of your body mass index (BMI).  Tests, such as: ? Blood tests that measure certain hormones in your body and rule out pregnancy. ? Urine tests. ? Imaging tests, such as an ultrasound, CT scan, or MRI. How is this treated? Treatment for this condition depends on the cause of the amenorrhea. It may involve:  Correcting dietary problems.  Treating underlying conditions.  Medicines.  Lifestyle changes.  Surgery. If the condition cannot be corrected, it is sometimes possible to trigger menstrual periods with medicines. Follow these instructions at home: Lifestyle  Maintain a healthy diet. Ask to meet with a registered dietitian for nutrition counseling and meal planning.  Maintain a healthy weight. Talk to your health care provider before trying any new diet or exercise plan.  Exercise at least 30 minutes 5 or more days each week. Exercising includes brisk walking, yard work, biking, running, swimming,  and team sports like basketball and soccer. Ask your health care provider which exercises are safe for you.  Get enough sleep. Plan your sleep time to allow for 7-9 hours of sleep each night.  Learn to manage stress. Explore relaxation techniques such as meditation, journaling, yoga, or tai chi. General instructions  Be aware of changes in your menstrual cycle. Keep a record of when you  have your menstrual period. Note the date your period starts, how long it lasts, and any problems you experience.  Take over-the-counter and prescription medicines only as told by your health care provider.  Keep all follow-up visits as told by your health care provider. This is important. Contact a health care provider if:  Your periods do not return to normal after treatment. Summary  Secondary amenorrhea is when a female who was previously having menstrual periods has not gotten her period for 3-6 months.  This condition has many causes. In many cases, treating the underlying cause will return menstrual periods back to a normal cycle.  Talk to your health care provider if your periods do not return to normal after treatment. This information is not intended to replace advice given to you by your health care provider. Make sure you discuss any questions you have with your health care provider. Document Revised: 09/09/2018 Document Reviewed: 06/14/2016 Elsevier Patient Education  [place].

## 2019-06-18 NOTE — Patient Instructions (Addendum)
-   Add 2  Ensures to your day.   - Check out [facility] support group, the last Tuesday of every month beginning 3/30 at [place] at: [email or web address]. Check out instagram account [email or web address]  for more information

## 2019-06-18 NOTE — Progress Notes (Signed)
Appointment start time: 3:12  Appointment end time: 4:19  Patient was seen on 06/18/19 for nutrition counseling pertaining to disordered eating  Primary care provider: Harrison Mons, PA Therapist: none  ROI: N/A Any other medical team members: NONE STATED  This note is not being shared with the patient for the following reason: To prevent harm (release of this note would result in harm to the life or physical safety of the patient or another).  Assessment  Pt wants to know what she needs to do to get back on the right track. States she has sickle cell trait, does not have the disease, but has been symptomatic. States she has low iron levels and anemia. States she knows she is performing at a lower level where it takes her longer to complete tasks; tired often.   States she normally will eat a spoonful of food which will lead to cooking or ordering out and this has been happening since before having lap band. States she will put food in her mouth and hold in her jaw. States she will swallow the first few bites and then will spit food back out. Reports that she only eats because she has a headache and it makes her feel better otherwise does not think about eating. States she does not think about food. States she drinks a lot now, mostly V8, water, and occasional soda. States she does not eat until 6-7 pm daily. Denies snacking on chips and candy. Still has lapband and had fluid taken out in 2017. Has upcoming appt with surgeon to discuss. States she doesn't want to have band taken out because she is an eater and doesn't want to gain weight.   States she experienced deficiencies with iron and Vitamin D after falling in Sept 2020. States she has not been in good place mentally since 2017; experienced multiple deaths in her family. States she has been out of work since Mar 14, 2018. Reports doing some intensive therapy for months and feels it was helpful. Not currently in therapy and apprehensive about  it. States guidance and suggestions are helpful for her. States she has cycles of sleeping all the time or not sleeping well; currently not sleeping well.   Reports having 4 children who live at home with her, ages 82, 41, 62, and 7.    Eating history: Length of time: throughout life Previous treatments: none Goals for RD meetings: improve relationship with food, improve dizziness/lightheadedness, headaches, focus/concentration, and cold intolerance  Weight history:  Highest weight: not reported  Lowest weight: not reported Most consistent weight: not reported  What would you like to weigh: not reported How has weight changed in the past year: not reported  Medical Information:  Changes in hair, skin, nails since ED started: brittle nails, hair shedding and coming out Chewing/swallowing difficulties: yes, swallowing is difficult and food feels stuck Reflux or heartburn: not anymore Trouble with teeth: yes LMP without the use of hormones: Nov 2020  Weight at that point: 195 Constipation, diarrhea: no, has BM daily Dizziness/lightheadedness: yes, daily Headaches/body aches: yes, daily Heart racing/chest pain: yes, has had tachycardia for 3 years Mood: depressed, irritable, moody Sleep: sleeps about 3 hrs/night Focus/concentration: challenging Cold intolerance: yes Vision changes yes, blurred vision  Mental health diagnosis:    Dietary assessment: A typical day consists of 1 meals and 0 snacks  Safe foods include: pastas, chicken mixed in pasta, chicken caesar salad, liquids  Avoided foods include: spicy foods  24 hour recall:  B:  [employer] - caramel frappucino (whole milk) S: V8 (12 oz glass) L: Smart water (2* ) S: 1/2 Mr. Pibb (from [employer]) + V8 energy (can)  D (9:30 pm): [employer] (1/2 breast + leg) + 1/2 baked potato (with butter and sour cream) + cookie (chocolate chip cookie) S:  Beverages: V8, water, soda   What Methods Do You Use To Control Your  Weight (Compensatory behaviors)?           Restricting (calories, fat, carbs)  SIV  Diet pills  Laxatives  Diuretics  Alcohol or drugs  Exercise (what type)  Food rules or rituals (explain)  Binge  Estimated energy intake: ~1100-1200 kcal  Estimated energy needs: 1800-2000 kcal 225-250 g CHO 90-100 g pro 60-67 g fat  Nutrition Diagnosis: NB-1.5 Disordered eating pattern As related to skipping meals.  As evidenced by dietary recall.  Intervention/Goals: Pt was educated and counseled on eating to nourish the body, signs/symptoms of not being adequately nourished, ways to increase nourishment, and importance of having a therapist on her healthcare team. Provided support group for people working on relationship with food. Pt was in agreement with goals listed.  Goals: - Add 2  Ensures to your day.  - Check out [facility] support group, the last Tuesday of every month beginning 3/30 at [place] at: [email or web address]. Check out instagram account [email or web address]  for more information.   Meal plan:    3 meals    3 snacks  Monitoring and Evaluation: Patient will follow up in 2 weeks.

## 2019-06-19 LAB — PROLACTIN: Prolactin: 11.2 ng/mL (ref 4.8–23.3)

## 2019-06-19 LAB — FOLLICLE STIMULATING HORMONE: FSH: 113 m[IU]/mL

## 2019-06-19 LAB — TSH: TSH: 2.34 u[IU]/mL (ref 0.450–4.500)

## 2019-06-24 ENCOUNTER — Other Ambulatory Visit: Payer: Self-pay | Admitting: *Deleted

## 2019-06-26 ENCOUNTER — Encounter: Payer: Self-pay | Admitting: Certified Nurse Midwife

## 2019-07-01 ENCOUNTER — Encounter: Payer: Self-pay | Admitting: Registered"

## 2019-07-01 ENCOUNTER — Encounter: Payer: Managed Care, Other (non HMO) | Admitting: Registered"

## 2019-07-01 ENCOUNTER — Other Ambulatory Visit: Payer: Self-pay

## 2019-07-01 DIAGNOSIS — Z713 Dietary counseling and surveillance: Secondary | ICD-10-CM

## 2019-07-01 DIAGNOSIS — E46 Unspecified protein-calorie malnutrition: Secondary | ICD-10-CM | POA: Diagnosis not present

## 2019-07-01 NOTE — Patient Instructions (Signed)
-   Aim to 3 meals a day. Examples can include:  Salmon (protein)  Squash, zuchinni, onion mix (vegetables)  Rice (starch/grain)   Rotisserie chicken (protein)  Rice (starch/grain)  Broccoli or cabbage (vegetables)   Chicken caesar salad wrap   Scrambled eggs, bacon  Rice  - Try microwaveable vegetables to help with preserving energy when preparing meals  - Continue with Ensure 2x/day  - Check out [facility] [facility] support group, the last Tuesday of every month beginning 3/30 at [place] at: [email or web address]. Check out instagram account [email or web address]  for more information.

## 2019-07-01 NOTE — Progress Notes (Signed)
Appointment start time: 11:12  Appointment end time: 11:48  Patient was seen on 06/18/19 for nutrition counseling pertaining to disordered eating  Primary care provider: Harrison Mons, PA Therapist: none  ROI: N/A Any other medical team members: none stated  This note is not being shared with the patient for the following reason: To prevent harm (release of this note would result in harm to the life or physical safety of the patient or another).  Assessment  Pt states things are going ok. Reports she has started Ensure (caramel flavor), 2x/day and protein bar (birthday cake flavor) but does not like the taste. States she had all fluid removed from [place]. Reports she will see surgeon again in 3 weeks. States her esophagus was dilated with possible lesion toward the bottom.   Reports she feels like her weight is trickling up. States she snacks at night on peanut butter crackers and chocolate candy bars. States she is no longer drinking sodas. States she only see her weight at doctor's offices: 217 (3/24) and was 207 (3/11); different provider offices. States last night she didn't eat dinner because it was her daughter's birthday and her family had pizza (thick crust) + cake and she does not like thick crust nor bread. States it makes her gag. States she was also nauseated from taking medicine yesterday.   Pt states she prefers to cook her food but does not have the energy to do so. Will use air fryer or pressure cooker at times. Prefers not to microwave food. Mainly eats meals: Salmon Squash, zuchinni, onion mix Rice  Baked chicken quarters Rice Cabbage  Chicken caesar salad wrap  Scrambled eggs, bacon Rice  Reports possibly returning to work in the near future. Pt also shares she ordered some pills to help with weight loss by [employer].   Previous appt: Pt wants to know what she needs to do to get back on the right track. States she has sickle cell trait, does not have the  disease, but has been symptomatic. States she has low iron levels and anemia. States she knows she is performing at a lower level where it takes her longer to complete tasks; tired often.  States she has not been in good place mentally since 2017; experienced multiple deaths in her family. States she has been out of work since Mar 14, 2018. Not currently in therapy and apprehensive about it. States guidance and suggestions are helpful for her.  Reports having 4 children who live at home with her, ages 65, 69, 54, and 84.    Eating history: Length of time: throughout life Previous treatments: none Goals for RD meetings: improve relationship with food, improve dizziness/lightheadedness, headaches, focus/concentration, and cold intolerance  Weight history:  Highest weight: not reported  Lowest weight: not reported Most consistent weight: not reported  What would you like to weigh: not reported How has weight changed in the past year: not reported  Medical Information:  Changes in hair, skin, nails since ED started: brittle nails, hair shedding and coming out Chewing/swallowing difficulties: yes, swallowing is difficult and food feels stuck Reflux or heartburn: not anymore Trouble with teeth: yes LMP without the use of hormones: Nov 2020  Weight at that point: 195 Constipation, diarrhea: no, has BM daily Dizziness/lightheadedness: yes, daily Headaches/body aches: yes, daily Heart racing/chest pain: yes, has had tachycardia for 3 years Mood: depressed, irritable, moody Sleep: sleeps about 3 hrs/night Focus/concentration: challenging Cold intolerance: yes Vision changes yes, blurred vision  Mental health diagnosis:  Dietary assessment: A typical day consists of  meals and 0 snacks  Safe foods include: pastas, chicken mixed in pasta, chicken caesar salad, liquids  Avoided foods include: spicy foods  24 hour recall:  B: Ensure  S:  L (2 pm): Ensure + protein bar  S:   D (6 pm):  skipped  S:  Beverages: Ensure, V8 (2-3*12 oz), water (4*16 oz bottles)  What Methods Do You Use To Control Your Weight (Compensatory behaviors)?           Restricting (calories, fat, carbs)  SIV  Diet pills  Laxatives  Diuretics  Alcohol or drugs  Exercise (what type)  Food rules or rituals (explain)  Binge  Estimated energy intake: ~800 kcal  Estimated energy needs: 1800-2000 kcal 225-250 g CHO 90-100 g pro 60-67 g fat  Nutrition Diagnosis: NB-1.5 Disordered eating pattern As related to skipping meals.  As evidenced by dietary recall.  Intervention/Goals: Pt was educated and counseled on eating to nourish the body, ways to increase nourishment, benefits of focusing more on internal health rather than physical weight/numbers. Discussed why dieting/focusing on weight loss is not helpful. Discussed meeting daily needs with food and nutritional shakes and the benefits of not having fluid in lapband at the moment. Discussed meal options and ways to preserve energy when preparing meals. Provided support group for people working on relationship with food. Pt was in agreement with goals listed.  Goals: - Aim to 3 meals a day. Examples can include:  Salmon (protein)  Squash, zuchinni, onion mix (vegetables)  Rice (starch/grain)   Rotisserie chicken (protein)  Rice (starch/grain)  Broccoli or cabbage (vegetables)   Chicken caesar salad wrap   Scrambled eggs, bacon  Rice  - Try microwaveable vegetables to help with preserving energy when preparing meals - Continue with Ensure 2x/day - Check out [facility] [employer] support group, the last Tuesday of every month beginning 3/30 at [place] at: [email or web address]. Check out instagram account [email or web address]  for more information.  Meal plan:    3 meals    2 snacks  Monitoring and Evaluation: Patient will follow up in 2 weeks.

## 2019-07-02 ENCOUNTER — Ambulatory Visit: Payer: Managed Care, Other (non HMO) | Admitting: Registered"

## 2019-07-03 ENCOUNTER — Encounter: Payer: Self-pay | Admitting: Obstetrics and Gynecology

## 2019-07-03 ENCOUNTER — Encounter: Payer: Self-pay | Admitting: Hematology & Oncology

## 2019-07-06 ENCOUNTER — Telehealth: Payer: Self-pay | Admitting: Obstetrics and Gynecology

## 2019-07-06 MED ORDER — MEDROXYPROGESTERONE ACETATE 5 MG PO TABS: ORAL_TABLET | ORAL | 1 refills | Status: DC

## 2019-07-06 NOTE — Telephone Encounter (Signed)
Spoke to pt. Pt calling to give Dr Talbert Nan an update from labs. Pt states having no bleeding since finishing the provera that she stopped on 06/22/2019. Pt had menopausal results on 06/18/2019. Pt agreeable and verbalized understanding.   Routing to Dr Talbert Nan for review and any additional recommendations.

## 2019-07-06 NOTE — Telephone Encounter (Signed)
Visit Follow-Up Question Received: 3 days ago Message Contents  Tiffany [name], Tiffany [name] sent to [place]  Phone Number: [phone]  Hi Dr. Talbert Nan,   I wanted to touch base with you regarding my test results and menopause. If I'm looking at the results correctly it's states that I'm post menopausal. Would that be correct?    Thanks,  [name]

## 2019-07-07 ENCOUNTER — Inpatient Hospital Stay (HOSPITAL_BASED_OUTPATIENT_CLINIC_OR_DEPARTMENT_OTHER): Payer: Managed Care, Other (non HMO) | Admitting: Family

## 2019-07-07 ENCOUNTER — Inpatient Hospital Stay: Payer: Managed Care, Other (non HMO) | Attending: Genetic Counselor

## 2019-07-07 ENCOUNTER — Encounter: Payer: Self-pay | Admitting: Family

## 2019-07-07 ENCOUNTER — Other Ambulatory Visit: Payer: Self-pay

## 2019-07-07 VITALS — BP 140/94 | HR 105 | Temp 97.3°F | Resp 18 | Ht 66.0 in | Wt 221.1 lb

## 2019-07-07 DIAGNOSIS — K909 Intestinal malabsorption, unspecified: Secondary | ICD-10-CM | POA: Diagnosis not present

## 2019-07-07 DIAGNOSIS — Z9884 Bariatric surgery status: Secondary | ICD-10-CM

## 2019-07-07 DIAGNOSIS — Z79899 Other long term (current) drug therapy: Secondary | ICD-10-CM | POA: Diagnosis not present

## 2019-07-07 DIAGNOSIS — D573 Sickle-cell trait: Secondary | ICD-10-CM | POA: Insufficient documentation

## 2019-07-07 DIAGNOSIS — Z86718 Personal history of other venous thrombosis and embolism: Secondary | ICD-10-CM | POA: Insufficient documentation

## 2019-07-07 DIAGNOSIS — K9089 Other intestinal malabsorption: Secondary | ICD-10-CM

## 2019-07-07 DIAGNOSIS — E559 Vitamin D deficiency, unspecified: Secondary | ICD-10-CM

## 2019-07-07 DIAGNOSIS — D508 Other iron deficiency anemias: Secondary | ICD-10-CM | POA: Insufficient documentation

## 2019-07-07 DIAGNOSIS — E538 Deficiency of other specified B group vitamins: Secondary | ICD-10-CM

## 2019-07-07 LAB — VITAMIN D 25 HYDROXY (VIT D DEFICIENCY, FRACTURES): Vit D, 25-Hydroxy: 45.42 ng/mL (ref 30–100)

## 2019-07-07 LAB — CMP (CANCER CENTER ONLY)
ALT: 12 U/L (ref 0–44)
AST: 17 U/L (ref 15–41)
Albumin: 4.1 g/dL (ref 3.5–5.0)
Alkaline Phosphatase: 46 U/L (ref 38–126)
Anion gap: 6 (ref 5–15)
BUN: 13 mg/dL (ref 6–20)
CO2: 32 mmol/L (ref 22–32)
Calcium: 9.4 mg/dL (ref 8.9–10.3)
Chloride: 103 mmol/L (ref 98–111)
Creatinine: 0.99 mg/dL (ref 0.44–1.00)
GFR, Est AFR Am: >60 mL/min (ref >60)
GFR, Estimated: >60 mL/min (ref >60)
Glucose, Bld: 105 mg/dL — ABNORMAL HIGH (ref 70–99)
Potassium: 3.6 mmol/L (ref 3.5–5.1)
Sodium: 141 mmol/L (ref 135–145)
Total Bilirubin: 0.5 mg/dL (ref 0.3–1.2)
Total Protein: 6.8 g/dL (ref 6.5–8.1)

## 2019-07-07 LAB — CBC WITH DIFFERENTIAL (CANCER CENTER ONLY)
Abs Immature Granulocytes: 0.03 10*3/uL (ref 0.00–0.07)
Basophils Absolute: 0 10*3/uL (ref 0.0–0.1)
Basophils Relative: 1 %
Eosinophils Absolute: 0.2 10*3/uL (ref 0.0–0.5)
Eosinophils Relative: 5 %
HCT: 40.8 % (ref 36.0–46.0)
Hemoglobin: 13.5 g/dL (ref 12.0–15.0)
Immature Granulocytes: 1 %
Lymphocytes Relative: 48 %
Lymphs Abs: 1.8 10*3/uL (ref 0.7–4.0)
MCH: 28.2 pg (ref 26.0–34.0)
MCHC: 33.1 g/dL (ref 30.0–36.0)
MCV: 85.4 fL (ref 80.0–100.0)
Monocytes Absolute: 0.3 10*3/uL (ref 0.1–1.0)
Monocytes Relative: 9 %
Neutro Abs: 1.4 10*3/uL — ABNORMAL LOW (ref 1.7–7.7)
Neutrophils Relative %: 36 %
Platelet Count: 271 10*3/uL (ref 150–400)
RBC: 4.78 MIL/uL (ref 3.87–5.11)
RDW: 18.3 % — ABNORMAL HIGH (ref 11.5–15.5)
WBC Count: 3.8 10*3/uL — ABNORMAL LOW (ref 4.0–10.5)
nRBC: 0 % (ref 0.0–0.2)

## 2019-07-07 LAB — RETICULOCYTES
Immature Retic Fract: 18.2 % — ABNORMAL HIGH (ref 2.3–15.9)
RBC.: 4.79 MIL/uL (ref 3.87–5.11)
Retic Count, Absolute: 95.8 10*3/uL (ref 19.0–186.0)
Retic Ct Pct: 2 % (ref 0.4–3.1)

## 2019-07-07 LAB — IRON AND TIBC
Iron: 145 ug/dL — ABNORMAL HIGH (ref 41–142)
Saturation Ratios: 46 % (ref 21–57)
TIBC: 315 ug/dL (ref 236–444)
UIBC: 170 ug/dL (ref 120–384)

## 2019-07-07 LAB — FOLATE: Folate: 54.8 ng/mL (ref >5.9)

## 2019-07-07 LAB — FERRITIN: Ferritin: 37 ng/mL (ref 11–307)

## 2019-07-07 LAB — VITAMIN B12: Vitamin B-12: 204 pg/mL (ref 180–914)

## 2019-07-07 NOTE — Progress Notes (Signed)
Hematology and Oncology Follow Up Visit  Tiffany [name] [id number] [birth year] 44 y.o. 07/07/2019   Principle Diagnosis:  Iron deficiency anemia secondary to malabsorption (lap band surgery 2009) and heavy cycles Sickle cell trait Vitamin D deficiency  History of left lower extremity DVT - resolved   Current Therapy:        IV iron as indicated  Folic acid 2 mg PO daily Vitamin D 50,000 units PO once a week    Interim History:  Tiffany [name] is here today for follow-up. She is feeling fatigued, SOB with exertion, palpitations and random chest pain.  Her Hgb is stable at 13.5, MCV 85. Iron studies, B 12 and vitamin D levels are pending.  She had seen her gastroenterologist and had the fluid removed from her band on 3/23. She has an appointment with her lap band surgeon Tiffany [name] on 4/14. She states that on recent MRI she had what appeared to be an esophageal lesion. She will discuss possible need for endoscopy for further evaluation at that appointment.  She is currently on Protonix 40 mg PO daily.  She states that she had her last endoscopy in 2017 and had multiple benign polyps removed from her stomach. She has not noted any blood loss. No bruising or petechiae.  She has not had a cycle since November 2020. She is currently on Provera.  She states that she had an abnormal pap and will be following up with Tiffany [name] if treatment is needed or just for observation.  She had been taking left over Xarelto from a procedure a few years ago because while visit her hematologist in [place] she had an elevated D-dimer. She had a CT angio and [place] of her left leg which were both negative. She also states that she had an extensive hyper coag work up that is currently pending. We did not advise that she be taking the Xarelto and she has since stopped.  She will be having an injection for the L4-L5 nerve pain and sciatica that she has been having with Tiffany [name].  No fever, chills, cough, rash,  dizziness, abdominal pain or changes in bowel or bladder habits.  She is taking a probiotic daily.  She has occasional numbness and tingling in her extremities.  She has puffiness in her feet and ankles that waxes and wanes.  No falls or syncopal episodes to report.  Her appetite comes and goes. She is hydrating. Her weight is stable.   ECOG Performance Status: 1 - Symptomatic but completely ambulatory  Medications:  Allergies as of 07/07/2019      Reactions   Flexeril [cyclobenzaprine Hcl] Shortness Of Breath, Itching, Swelling, Rash   Swelling of throat   Lamictal [lamotrigine] Rash   Latex Shortness Of Breath, Itching, Rash   Seasonal Ic  [cholestatin] Other (See Comments)   Other Other (See Comments)   RUBBER      Medication List       Accurate as of July 07, 2019 10:52 AM. If you have any questions, ask your nurse or doctor.        amphetamine-dextroamphetamine 30 MG tablet Commonly known as: ADDERALL Take 30 mg by mouth 2 (two) times daily. Morning and noon   Bariatric Multivitamins/Iron Caps Take 1 capsule by mouth daily.   desloratadine 5 MG tablet Commonly known as: CLARINEX Take 5 mg by mouth daily.   folic acid 1 MG tablet Commonly known as: FOLVITE Take 2 tablets (2 mg total) by mouth daily.  gabapentin 100 MG capsule Commonly known as: NEURONTIN Take 100 mg by mouth in the morning, at noon, and at bedtime.   medroxyPROGESTERone 5 MG tablet Commonly known as: Provera Take one tablet a day x 5 days every other month if no spontaneous cycles. You should do this until you go 6 more months without bleeding.   pantoprazole 40 MG tablet Commonly known as: PROTONIX Take 40 mg by mouth daily.   potassium chloride SA 20 MEQ tablet Commonly known as: KLOR-CON Take 20 mEq by mouth daily.   Spravato (56 MG Dose) 28 MG/DEVICE Sopk Generic drug: Esketamine HCl (56 MG Dose) Place 2 sprays into the nose every 14 (fourteen) days.   topiramate 100 MG  tablet Commonly known as: TOPAMAX Take 100 mg by mouth 2 (two) times daily.   Trintellix 20 MG Tabs tablet Generic drug: vortioxetine HBr Take 20 mg by mouth daily.   Vitamin D3 1.25 MG (50000 UT) Caps Take 1 capsule by mouth once a week.   Xanax 1 MG tablet Generic drug: ALPRAZolam Take 1 mg by mouth at bedtime as needed for anxiety.   zolpidem 5 MG tablet Commonly known as: AMBIEN Take 5 mg by mouth at bedtime.       Allergies:  Allergies  Allergen Reactions  . Flexeril [Cyclobenzaprine Hcl] Shortness Of Breath, Itching, Swelling and Rash    Swelling of throat  . Lamictal [Lamotrigine] Rash  . Latex Shortness Of Breath, Itching and Rash  . Seasonal Ic  [Cholestatin] Other (See Comments)  . Other Other (See Comments)    RUBBER    Past Medical History, Surgical history, Social history, and Family History were reviewed and updated.  Review of Systems: All other 10 point review of systems is negative.   Physical Exam:  vitals were not taken for this visit.   Wt Readings from Last 3 Encounters:  06/18/19 211 lb (95.7 kg)  05/26/19 203 lb (92.1 kg)  04/02/19 205 lb (93 kg)    Ocular: Sclerae unicteric, pupils equal, round and reactive to light Ear-nose-throat: Oropharynx clear, dentition fair Lymphatic: No cervical or supraclavicular adenopathy Lungs no rales or rhonchi, good excursion bilaterally Heart regular rate and rhythm, no murmur appreciated Abd soft, nontender, positive bowel sounds, no liver or spleen tip palpated on exam, no fluid wave  MSK no focal spinal tenderness, no joint edema Neuro: non-focal, well-oriented, appropriate affect Breasts: Deferred   Lab Results  Component Value Date   WBC 3.4 (L) 05/26/2019   HGB 13.1 05/26/2019   HCT 39.6 05/26/2019   MCV 78.7 (L) 05/26/2019   PLT 299 05/26/2019   Lab Results  Component Value Date   FERRITIN 111 05/26/2019   IRON 98 05/26/2019   TIBC 300 05/26/2019   UIBC 202 05/26/2019   IRONPCTSAT  33 05/26/2019   Lab Results  Component Value Date   RETICCTPCT 1.2 05/26/2019   RBC 5.03 05/26/2019   RBC 5.04 05/26/2019   No results found for: KPAFRELGTCHN, LAMBDASER, KAPLAMBRATIO No results found for: IGGSERUM, IGA, IGMSERUM No results found for: Ronnald Ramp, A1GS, A2GS, Violet Baldy, MSPIKE, SPEI   Chemistry      Component Value Date/Time   NA 139 05/26/2019 1308   NA 138 03/10/2019 1019   K 3.6 05/26/2019 1308   CL 104 05/26/2019 1308   CO2 27 05/26/2019 1308   BUN 15 05/26/2019 1308   BUN 8 03/10/2019 1019   CREATININE 1.01 (H) 05/26/2019 1308   CREATININE 0.75 06/10/2018 1013  Component Value Date/Time   CALCIUM 9.8 05/26/2019 1308   CALCIUM 8.7 12/26/2018 0835   ALKPHOS 47 05/26/2019 1308   AST 15 05/26/2019 1308   ALT 11 05/26/2019 1308   BILITOT 0.6 05/26/2019 1308       Impression and Plan: Ms. Ishibashi is a 44 yo African American female with history of iron deficiency anemia, vitamin D deficiency and sickle cell trait. She has malabsorption due to previous lap band surgery.  We will see what all of today's lab work shows and bring her back in for replacement if needed.  We will go ahead and plan to see her back in another 3 months.  She will contact our office with any questions or concerns. We can certainly see her sooner if needed.   Laverna Peace, NP 3/30/202110:52 AM

## 2019-07-08 ENCOUNTER — Telehealth: Payer: Self-pay | Admitting: Hematology & Oncology

## 2019-07-08 ENCOUNTER — Ambulatory Visit: Payer: Managed Care, Other (non HMO) | Admitting: Registered"

## 2019-07-08 ENCOUNTER — Telehealth: Payer: Managed Care, Other (non HMO) | Admitting: Registered"

## 2019-07-09 ENCOUNTER — Telehealth: Payer: Self-pay | Admitting: *Deleted

## 2019-07-09 NOTE — Telephone Encounter (Signed)
As noted below by Laverna Peace, NP, I informed the patient of her lab results. No iron infusions or replacements needed at this time. She verbalized understanding.

## 2019-07-09 NOTE — Telephone Encounter (Signed)
-----   Message from [name], NP sent at 07/09/2019 11:57 AM EDT ----- Lab work looks good! No infusions or replacement needed at this time.   Tiffany [name] ----- Message ----- From: Buel Ream, Lab In [place] Sent: 07/07/2019  10:53 AM EDT To: [name], NP

## 2019-07-13 ENCOUNTER — Encounter: Payer: Self-pay | Admitting: Family

## 2019-07-14 ENCOUNTER — Other Ambulatory Visit: Payer: Self-pay | Admitting: Family

## 2019-07-22 ENCOUNTER — Encounter: Payer: Self-pay | Admitting: Registered"

## 2019-07-22 ENCOUNTER — Encounter: Payer: Managed Care, Other (non HMO) | Attending: Physician Assistant | Admitting: Registered"

## 2019-07-22 ENCOUNTER — Other Ambulatory Visit: Payer: Self-pay

## 2019-07-22 DIAGNOSIS — E46 Unspecified protein-calorie malnutrition: Secondary | ICD-10-CM | POA: Insufficient documentation

## 2019-07-22 DIAGNOSIS — Z713 Dietary counseling and surveillance: Secondary | ICD-10-CM | POA: Insufficient documentation

## 2019-07-22 NOTE — Progress Notes (Signed)
Appointment start time: 8:24  Appointment end time: 9:12  Patient was seen on 07/22/19 for nutrition counseling pertaining to disordered eating  Primary care provider: Harrison Mons, PA Therapist: none  ROI: N/A Any other medical team members: none stated  This note is not being shared with the patient for the following reason: To prevent harm (release of this note would result in harm to the life or physical safety of the patient or another).  Assessment  Pt arrives stating she received a steroid shot in her back since previous visit. States she felt like she is eating better yet still eating small amounts of food. States she is eating fast and her stomach protrudes at the top after eating. States she has been eating breakfast, lunch, and dinner. Reports eating more vegetables such as peppers, onions, mushrooms, zucchini, squash, brussels sprouts, and cabbage. Reports salmon has been main protein along with shrimp and scallops. States she has never really eaten brussels sprouts before and somewhat enjoys them.  States she is waiting to see how shot for leg will affect her improving her numbness of leg and foot. States she will eat breakfast with mom and daughter and other meals with family as well; this helps her.   Reports she is still trying to remind herself of eating to nourish the body and not solely for taste and pleasure.   Previous appt: States she had all fluid removed from [place]. Reports she will see surgeon again 07/22/19. States her esophagus was dilated with possible lesion toward the bottom. Prefers not to microwave food. Mainly eats meals: Salmon Squash, zuchinni, onion mix Rice  Baked chicken quarters Rice Cabbage  Chicken caesar salad wrap  Scrambled eggs, bacon Rice  Pt wants to know what she needs to do to get back on the right track. States she has sickle cell trait, does not have the disease, but has been symptomatic. States she has low iron levels and anemia.  States she knows she is performing at a lower level where it takes her longer to complete tasks; tired often. States she has not been in good place mentally since 2017; experienced multiple deaths in her family. States she has been out of work since Mar 14, 2018. Not currently in therapy and apprehensive about it. States guidance and suggestions are helpful for her.  Reports having 4 children who live at home with her, ages 28, 73, 65, and 1.    Eating history: Length of time: throughout life Previous treatments: none Goals for RD meetings: improve relationship with food, improve dizziness/lightheadedness, headaches, focus/concentration, and cold intolerance  Weight history:  Highest weight: not reported  Lowest weight: not reported Most consistent weight: not reported  What would you like to weigh: not reported How has weight changed in the past year: not reported  Medical Information:  Changes in hair, skin, nails since ED started: brittle nails, hair shedding and coming out Chewing/swallowing difficulties: yes, swallowing is difficult and food feels stuck Reflux or heartburn: not anymore Trouble with teeth: yes LMP without the use of hormones: Nov 2020  Weight at that point: 195 Constipation, diarrhea: no, has BM daily Dizziness/lightheadedness: yes, daily Headaches/body aches: yes, daily Heart racing/chest pain: yes, has had tachycardia for 3 years Mood: depressed, irritable, moody Sleep: sleeps about 3 hrs/night Focus/concentration: challenging Cold intolerance: yes Vision changes yes, blurred vision  Mental health diagnosis:    Dietary assessment: A typical day consists of  meals and 0 snacks  Safe foods include: pastas, chicken  mixed in pasta, chicken caesar salad, liquids  Avoided foods include: spicy foods  24 hour recall:  B: [place] - hash brown S:  L (2 pm): vegetables (Mrs. Deliah Boston) + salmon (air-fried) + roasted asparagus S: 2 sugar cookies D (6 pm):  vegetables (Mrs. Deliah Boston) + salmon (air-fried) + roasted asparagus S: 2 chocolate chip cookies + peanut m&m's + hershey's bar  Beverages: Dr. Malachi Bonds (bottle), Marcelline Mates Pepsi, water (4*16 oz bottles)  Physical activity: PT is 2x/week  What Methods Do You Use To Control Your Weight (Compensatory behaviors)?           Restricting (calories, fat, carbs)  SIV  Diet pills  Laxatives  Diuretics  Alcohol or drugs  Exercise (what type)  Food rules or rituals (explain)  Binge  Estimated energy intake: ~1200-1600 kcal  Estimated energy needs: 1800-2000 kcal 225-250 g CHO 90-100 g pro 60-67 g fat  Nutrition Diagnosis: NB-1.5 Disordered eating pattern As related to skipping meals.  As evidenced by dietary recall.  Intervention/Goals: Pt was encouraged to keep up the great work increasing nourishment, eating at least 2 meals/day and having more balance with meal options. Discuss how to create balance with breakfast and other protein options in addition to salmon. Also encouraged to continue embracing thoughts that food is nourishment and fuel for the body. Reminded of support group for people working on relationship with food. Pt was in agreement with goals listed.  Goals: - Contact therapist.  - Continue to have 3 meals a day. Great job with that! - Continue having meals with others.  - Add protein to breakfast option.   - Check out virtual support group on 4/27 at 8   Meal plan:    3 meals    2 snacks  Monitoring and Evaluation: Patient will follow up in 3 weeks due to provider availability.

## 2019-07-22 NOTE — Patient Instructions (Addendum)
-   Contact therapist.   - Continue to have 3 meals a day. Great job with that!  - Continue having meals with others.   - Add protein to breakfast option.    - Check out virtual support group on 4/27 at 8 pm.

## 2019-08-14 ENCOUNTER — Telehealth: Payer: Self-pay

## 2019-08-14 ENCOUNTER — Encounter: Payer: Self-pay | Admitting: Obstetrics and Gynecology

## 2019-08-14 NOTE — Telephone Encounter (Signed)
Pt sent following mychart message:    Hello Dr. Talbert Nan,  I have started my menstrual cycle again, after test confirmed menopause per the results.  My cycle started last week on April 27th, 2021. However I wasn't aware it was beginning at that time.  It started as spotting here and there daily until May 2nd, 2021 and that's when I needed a light panty liner, thereafter on the 4th, is when the cycle became heavy again where I am needing to where both a tampon and pad to maintain the flow.  I am not sure if I need to be seen or if this is something that I can handle over MyChart messages, a telemed call, or if I need to come into the office to be seen.  Thanks, [employer]

## 2019-08-14 NOTE — Telephone Encounter (Signed)
Given that she didn't bleed after her provera in 3/21, hopefully her lining isn't too thick. If she is going through greater than one large pad an hour, if she gets lightheaded or dizzy she should call. If her bleeding doesn't stop in a week, she should call.

## 2019-08-14 NOTE — Telephone Encounter (Signed)
AEX 03/10/19 OV 06/18/19 for Amenorrhea On Gabapentin Rx  FSH 113, Prolactin 11.2 on 06/18/19 OV  Spoke with pt. Pt reports heavy bleeding that started on 08/12/19. Pt states had spotting "menses" on 4/27-5/2. Pt states changing maxipad, tampon and Depends 4 times a day/every 6 hours. Denies lightheadedness, dizzy, weakness. Pt states has small clots. Pt is menopausal according to labs on 06/1119. Pt had no cycle from Nov 2020 til now. Pt took provera 5mg  x 5 days in March, had no cycle. Did not take Provera in April.  Pt takes 400mg  TID of gabapentin. States has made her gain 62 lbs since started in Feb 2021. Pt has been on 1200 mg for 1 month now. States is dizzy while taking this, but manageable. Pt states having bloating last week that made her look pregnant, but noticed her stomach going down after bleeding started.   Advised will review with Dr Talbert Nan for further recommendations and if OV needed today. Pt agreeable.   Routing to Dr Talbert Nan.

## 2019-08-14 NOTE — Telephone Encounter (Signed)
Spoke with pt. Pt given recommendations per Dr Talbert Nan. Pt agreeable and verbalized understanding. Pt advised to call in 1 week if still bleeding by 5/12. Pt agreeable. ER precautions given. Pt agreeable  Routing to Dr Talbert Nan  Encounter closed.

## 2019-08-24 ENCOUNTER — Telehealth: Payer: Self-pay | Admitting: Obstetrics and Gynecology

## 2019-08-24 NOTE — Telephone Encounter (Signed)
AEX 03/2019 OV 06/18/19 for amenorrhea Menopause no HRT G12 P6   Spoke with pt. Pt reports "unable to hold urine" x 1 week. Pt states drinking water, then having to go to bathroom within 20-30 mins later and when walking to bathroom, urine runs down leg/pants before making it to bathroom. Pt denies wearing urinary incontinence pad at this time. Pt states has frequency, but denies UTI sx of urgency, burning, fever, chills, back pain, vaginal bleeding or discharge or odor.Denies vaginal bulge seen or felt for possible prolapse. Pt no SA at this time.  Pt states thinks maybe due to having children. Pt advised to have OV for further evaluation. Pt agreeable. Pt scheduled for OV with Dr Talbert Nan at 2:30 pm. Pt agreeable and verbalized understanding of time and date of appt. CPS neg.   Routing to Dr Talbert Nan for review.  Encounter closed.

## 2019-08-24 NOTE — Telephone Encounter (Signed)
Left message for pt to return call to triage RN. 

## 2019-08-24 NOTE — Telephone Encounter (Signed)
Appointment Request From: Neill Loft    With Provider: Salvadore Dom, MD [facility]]    Preferred Date Range: 08/24/2019 - 08/25/2019    Preferred Times: Any Time    Reason for visit: Office Visit    Comments:  Unable to hold my urine. Acute onset.

## 2019-08-25 ENCOUNTER — Ambulatory Visit (INDEPENDENT_AMBULATORY_CARE_PROVIDER_SITE_OTHER): Payer: Managed Care, Other (non HMO) | Admitting: Obstetrics and Gynecology

## 2019-08-25 ENCOUNTER — Ambulatory Visit: Payer: Managed Care, Other (non HMO) | Admitting: Obstetrics and Gynecology

## 2019-08-25 ENCOUNTER — Encounter: Payer: Self-pay | Admitting: Obstetrics and Gynecology

## 2019-08-25 ENCOUNTER — Other Ambulatory Visit: Payer: Self-pay

## 2019-08-25 VITALS — BP 130/72 | HR 121 | Temp 98.1°F | Ht 66.0 in | Wt 247.0 lb

## 2019-08-25 DIAGNOSIS — R32 Unspecified urinary incontinence: Secondary | ICD-10-CM

## 2019-08-25 DIAGNOSIS — R350 Frequency of micturition: Secondary | ICD-10-CM | POA: Diagnosis not present

## 2019-08-25 LAB — POCT URINALYSIS DIPSTICK
Bilirubin, UA: NEGATIVE
Glucose, UA: NEGATIVE
Ketones, UA: NEGATIVE
Nitrite, UA: NEGATIVE
Protein, UA: POSITIVE — AB
Urobilinogen, UA: NEGATIVE E.U./dL — AB
pH, UA: 6 (ref 5.0–8.0)

## 2019-08-25 MED ORDER — SULFAMETHOXAZOLE-TRIMETHOPRIM 800-160 MG PO TABS: 1.0000 | ORAL_TABLET | Freq: Two times a day (BID) | ORAL | 0 refills | Status: AC

## 2019-08-25 MED ORDER — FLUCONAZOLE 150 MG PO TABS: 150.0000 mg | ORAL_TABLET | Freq: Once | ORAL | 0 refills | Status: AC

## 2019-08-25 NOTE — Patient Instructions (Signed)
Urinary Tract Infection, Adult  A urinary tract infection (UTI) is an infection of any part of the urinary tract. The urinary tract includes the kidneys, ureters, bladder, and urethra. These organs make, store, and get rid of urine in the body. Your health care provider may use other names to describe the infection. An upper UTI affects the ureters and kidneys (pyelonephritis). A lower UTI affects the bladder (cystitis) and urethra (urethritis). What are the causes? Most urinary tract infections are caused by bacteria in your genital area, around the entrance to your urinary tract (urethra). These bacteria grow and cause inflammation of your urinary tract. What increases the risk? You are more likely to develop this condition if:  You have a urinary catheter that stays in place (indwelling).  You are not able to control when you urinate or have a bowel movement (you have incontinence).  You are female and you: ? Use a spermicide or diaphragm for birth control. ? Have low estrogen levels. ? Are pregnant.  You have certain genes that increase your risk (genetics).  You are sexually active.  You take antibiotic medicines.  You have a condition that causes your flow of urine to slow down, such as: ? An enlarged prostate, if you are female. ? Blockage in your urethra (stricture). ? A kidney stone. ? A nerve condition that affects your bladder control (neurogenic bladder). ? Not getting enough to drink, or not urinating often.  You have certain medical conditions, such as: ? Diabetes. ? A weak disease-fighting system (immunesystem). ? Sickle cell disease. ? Gout. ? Spinal cord injury. What are the signs or symptoms? Symptoms of this condition include:  Needing to urinate right away (urgently).  Frequent urination or passing small amounts of urine frequently.  Pain or burning with urination.  Blood in the urine.  Urine that smells bad or unusual.  Trouble urinating.  Cloudy  urine.  Vaginal discharge, if you are female.  Pain in the abdomen or the lower back. You may also have:  Vomiting or a decreased appetite.  Confusion.  Irritability or tiredness.  A fever.  Diarrhea. The first symptom in older adults may be confusion. In some cases, they may not have any symptoms until the infection has worsened. How is this diagnosed? This condition is diagnosed based on your medical history and a physical exam. You may also have other tests, including:  Urine tests.  Blood tests.  Tests for sexually transmitted infections (STIs). If you have had more than one UTI, a cystoscopy or imaging studies may be done to determine the cause of the infections. How is this treated? Treatment for this condition includes:  Antibiotic medicine.  Over-the-counter medicines to treat discomfort.  Drinking enough water to stay hydrated. If you have frequent infections or have other conditions such as a kidney stone, you may need to see a health care provider who specializes in the urinary tract (urologist). In rare cases, urinary tract infections can cause sepsis. Sepsis is a life-threatening condition that occurs when the body responds to an infection. Sepsis is treated in the hospital with IV antibiotics, fluids, and other medicines. Follow these instructions at home:  Medicines  Take over-the-counter and prescription medicines only as told by your health care provider.  If you were prescribed an antibiotic medicine, take it as told by your health care provider. Do not stop using the antibiotic even if you start to feel better. General instructions  Make sure you: ? Empty your bladder often and   completely. Do not hold urine for long periods of time. ? Empty your bladder after sex. ? Wipe from front to back after a bowel movement if you are female. Use each tissue one time when you wipe.  Drink enough fluid to keep your urine pale yellow.  Keep all follow-up  visits as told by your health care provider. This is important. Contact a health care provider if:  Your symptoms do not get better after 1-2 days.  Your symptoms go away and then return. Get help right away if you have:  Severe pain in your back or your lower abdomen.  A fever.  Nausea or vomiting. Summary  A urinary tract infection (UTI) is an infection of any part of the urinary tract, which includes the kidneys, ureters, bladder, and urethra.  Most urinary tract infections are caused by bacteria in your genital area, around the entrance to your urinary tract (urethra).  Treatment for this condition often includes antibiotic medicines.  If you were prescribed an antibiotic medicine, take it as told by your health care provider. Do not stop using the antibiotic even if you start to feel better.  Keep all follow-up visits as told by your health care provider. This is important. This information is not intended to replace advice given to you by your health care provider. Make sure you discuss any questions you have with your health care provider. Document Revised: 03/13/2018 Document Reviewed: 10/03/2017 Elsevier Patient Education  [place]. Urinary Incontinence  Urinary incontinence refers to a condition in which a person is unable to control where and when to pass urine. A person with this condition will urinate when he or she does not mean to (involuntarily). What are the causes? This condition may be caused by:  Medicines.  Infections.  Constipation.  Overactive bladder muscles.  Weak bladder muscles.  Weak pelvic floor muscles. These muscles provide support for the bladder, intestine, and, in women, the uterus.  Enlarged prostate in men. The prostate is a gland near the bladder. When it gets too big, it can pinch the urethra. With the urethra blocked, the bladder can weaken and lose the ability to empty properly.  Surgery.  Emotional factors, such as  anxiety, stress, or post-traumatic stress disorder (PTSD).  Pelvic organ prolapse. This happens in women when organs shift out of place and into the vagina. This shift can prevent the bladder and urethra from working properly. What increases the risk? The following factors may make you more likely to develop this condition:  Older age.  Obesity and physical inactivity.  Pregnancy and childbirth.  Menopause.  Diseases that affect the nerves or spinal cord (neurological diseases).  Long-term (chronic) coughing. This can increase pressure on the bladder and pelvic floor muscles. What are the signs or symptoms? Symptoms may vary depending on the type of urinary incontinence you have. They include:  A sudden urge to urinate, but passing urine involuntarily before you can get to a bathroom (urge incontinence).  Suddenly passing urine with any activity that forces urine to pass, such as coughing, laughing, exercise, or sneezing (stress incontinence).  Needing to urinate often, but urinating only a small amount, or constantly dribbling urine (overflow incontinence).  Urinating because you cannot get to the bathroom in time due to a physical disability, such as arthritis or injury, or communication and thinking problems, such as Alzheimer disease (functional incontinence). How is this diagnosed? This condition may be diagnosed based on:  Your medical history.  A physical  exam.  Tests, such as: ? Urine tests. ? X-rays of your kidney and bladder. ? Ultrasound. ? CT scan. ? Cystoscopy. In this procedure, a health care provider inserts a tube with a light and camera (cystoscope) through the urethra and into the bladder in order to check for problems. ? Urodynamic testing. These tests assess how well the bladder, urethra, and sphincter can store and release urine. There are different types of urodynamic tests, and they vary depending on what the test is measuring. To help diagnose your  condition, your health care provider may recommend that you keep a log of when you urinate and how much you urinate. How is this treated? Treatment for this condition depends on the type of incontinence that you have and its cause. Treatment may include:  Lifestyle changes, such as: ? Quitting smoking. ? Maintaining a healthy weight. ? Staying active. Try to get 150 minutes of moderate-intensity exercise every week. Ask your health care provider which activities are safe for you. ? Eating a healthy diet.  Avoid high-fat foods, like fried foods.  Avoid refined carbohydrates like white bread and white rice.  Limit how much alcohol and caffeine you drink.  Increase your fiber intake. Foods such as fresh fruits, vegetables, beans, and whole grains are healthy sources of fiber.  Pelvic floor muscle exercises.  Bladder training, such as lengthening the amount of time between bathroom breaks, or using the bathroom at regular intervals.  Using techniques to suppress bladder urges. This can include distraction techniques or controlled breathing exercises.  Medicines to relax the bladder muscles and prevent bladder spasms.  Medicines to help slow or prevent the growth of a man's prostate.  Botox injections. These can help relax the bladder muscles.  Using pulses of electricity to help change bladder reflexes (electrical nerve stimulation).  For women, using a medical device to prevent urine leaks. This is a small, tampon-like, disposable device that is inserted into the urethra.  Injecting collagen or carbon beads (bulking agents) into the urinary sphincter. These can help thicken tissue and close the bladder opening.  Surgery. Follow these instructions at home: Lifestyle  Limit alcohol and caffeine. These can fill your bladder quickly and irritate it.  Keep yourself clean to help prevent odors and skin damage. Ask your doctor about special skin creams and cleansers that can protect  the skin from urine.  Consider wearing pads or adult diapers. Make sure to change them regularly, and always change them right after experiencing incontinence. General instructions  Take over-the-counter and prescription medicines only as told by your health care provider.  Use the bathroom about every 3-4 hours, even if you do not feel the need to urinate. Try to empty your bladder completely every time. After urinating, wait a minute. Then try to urinate again.  Make sure you are in a relaxed position while urinating.  If your incontinence is caused by nerve problems, keep a log of the medicines you take and the times you go to the bathroom.  Keep all follow-up visits as told by your health care provider. This is important. Contact a health care provider if:  You have pain that gets worse.  Your incontinence gets worse. Get help right away if:  You have a fever or chills.  You are unable to urinate.  You have redness in your groin area or down your legs. Summary  Urinary incontinence refers to a condition in which a person is unable to control where and when to pass  urine.  This condition may be caused by medicines, infection, weak bladder muscles, weak pelvic floor muscles, enlargement of the prostate (in men), or surgery.  The following factors increase your risk for developing this condition: older age, obesity, pregnancy and childbirth, menopause, neurological diseases, and chronic coughing.  There are several types of urinary incontinence. They include urge incontinence, stress incontinence, overflow incontinence, and functional incontinence.  This condition is usually treated first with lifestyle and behavioral changes, such as quitting smoking, eating a healthier diet, and doing regular pelvic floor exercises. Other treatment options include medicines, bulking agents, medical devices, electrical nerve stimulation, or surgery. This information is not intended to replace  advice given to you by your health care provider. Make sure you discuss any questions you have with your health care provider. Document Revised: 04/05/2017 Document Reviewed: 07/05/2016 Elsevier Patient Education  [place].

## 2019-08-25 NOTE — Progress Notes (Signed)
GYNECOLOGY  VISIT   HPI: 44 y.o.   Single Black or African American Hispanic or Latino  female   [phone] with No LMP recorded. (Menstrual status: Irregular Periods).   here for a week h/o urge incontinence. Intermittent frequency and urgency. No pain. Mild mid lower abdominal pain.  She has had some constipation lately. No fever. No increase in caffeine. She has stated to intermittent fast to loose weight. Avoiding soda. No coffee or tea.  No GSI.     GYNECOLOGIC HISTORY: No LMP recorded. (Menstrual status: Irregular Periods). Contraception: none  Menopausal hormone therapy: none         OB History    Gravida  12   Para  6   Term  6   Preterm      AB  6   Living  6     SAB  6   TAB      Ectopic      Multiple      Live Births  6              Patient Active Problem List   Diagnosis Date Noted  . Iron deficiency anemia due to chronic blood loss 04/02/2019  . Menometrorrhagia 04/02/2019  . Iron malabsorption 04/02/2019  . Family history of ovarian cancer   . Family history of colonic polyps   . Personal history of colonic polyps   . Acetabulum fracture, right ([place]) 12/26/2018  . Conversion disorder 10/06/2018  . Complaints of total body pain 10/06/2018  . Suspected COVID-19 virus infection 07/28/2018  . Cognitive changes 07/03/2018  . Attention deficit disorder 07/03/2018  . Cyst of ovary 06/10/2018  . Weight loss 06/10/2018  . Neck pain 06/10/2018  . Night sweats 06/10/2018  . Fatigue 06/10/2018  . Nausea and vomiting 06/10/2018  . White matter abnormality on MRI of brain 06/09/2018  . ASCUS with positive high risk HPV cervical 12/17/2017  . Anemia 11/27/2017  . Palpitations 11/28/2016  . Vitamin D deficiency 11/28/2016  . Abdominal bloating 12/27/2015  . Abnormal weight gain 12/23/2015  . Peripheral edema 11/22/2015  . Atypical chest pain 11/10/2015  . Gastric polyps 11/10/2015  . Hypokalemia 08/15/2015  . Seasonal allergic rhinitis due to  pollen 07/05/2015  . Sickle cell trait ([place]) 07/05/2015  . Genetic testing 06/13/2015  . Family history of breast cancer 06/02/2015  . Family history of malignant neoplasm of ovary 06/02/2015  . BV (bacterial vaginosis) 02/18/2015  . Hemorrhoids 02/18/2015  . Fibrositis 11/30/2014  . Left leg DVT ([place]) 11/18/2014  . History of DVT of lower extremity 11/18/2014  . Left leg pain 11/09/2014  . History of laparoscopic adjustable gastric banding 01/29/2012  . Depression 12/19/2011  . Fibromyalgia 12/19/2011  . Arthritis 12/19/2011  . Irritable bowel syndrome 12/19/2011  . Gastroesophageal reflux disease 12/19/2011  . Uterine leiomyoma 12/19/2011  . Current moderate episode of major depressive disorder without prior episode ([place]) 12/19/2011  . Undifferentiated inflammatory arthritis ([place]) 12/19/2011  . Abnormal cervical Papanicolaou smear 04/10/1995    Past Medical History:  Diagnosis Date  . Abnormal Pap smear 1997   LAST PAP 10/2010  . Allergy    SEASONAL  . Anemia    CHRONIC  . 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 Arthritis   . Blood dyscrasia    SICKLE CELL TRAIT  . Blood transfusion without reported diagnosis 2005   after vaginal delivery  . Breast mass in female 2003  . Broken foot 03/16/2016  right  . Bruises easily   . BV (bacterial vaginosis)   . Cancer ([place])   . Cyst of ovary 2003  . Depression 2003  . DUB (dysfunctional uterine bleeding) 2003  . DVT, lower extremity ([place]) 10/2014   left leg  . Family history of breast cancer   . Family history of colonic polyps   . Family history of ovarian cancer   . Fatigue 06/10/2018  . Fibroid    LONG TERM DX  . Fibromyalgia 2004  . Fibrositis   . GERD (gastroesophageal reflux disease) 2007  . H/O amenorrhea 2006  . H/O menorrhagia 2011  . H/O sickle cell trait   . Heart murmur 2007  . History of abnormal cervical Pap smear 2003   had colposcopy and cryo.  Follow up paps are normal.  . Hx: UTI (urinary tract  infection) 2003  . Hypertension 2005   RESOLVED WITH WT LOSS  . IBD (inflammatory bowel disease)   . IBS (irritable bowel syndrome)   . Infection    OCC YEAST  . Iron deficiency anemia due to chronic blood loss 04/02/2019  . Iron malabsorption 04/02/2019  . Low back pain 2003  . Menometrorrhagia 04/02/2019  . Menses, irregular 2003  . Migraine   . Nausea and vomiting 06/10/2018  . Neck pain 06/10/2018  . Night sweats 06/10/2018  . Obese 2003  . Personal history of colonic polyps   . Pulmonary embolism ([place]) 10/2014  . Sacroiliitis ([place])   . Sickle-cell trait ([place])   . Suspected COVID-19 virus infection 07/28/2018  . Trichomonas   . Uterine leiomyoma   . Vaginitis and vulvovaginitis 2004  . Weight loss 06/10/2018  . Yeast vaginitis 04/24/2010    Past Surgical History:  Procedure Laterality Date  . AUGMENTATION MAMMAPLASTY  [id number]   Silicone implants  . broken ankle Right   . CERVICAL CONE BIOPSY  1997  . COSMETIC SURGERY  2012   thigh lift  . CYSTECTOMY Right    obtain date from patient, was not on history form dated 06/16/10--benign cyst removed Rt.ovary  . DILATION AND CURETTAGE OF UTERUS    . LAPAROSCOPIC GASTRIC BANDING  05/20/2007  . ORIF ACETABULAR FRACTURE Right 12/25/2018   Procedure: OPEN REDUCTION INTERNAL FIXATION (ORIF) ACETABULAR FRACTURE;  Surgeon: Altamese Leota, MD;  Location: [facility];  Service: Orthopedics;  Laterality: Right;  . WISDOM TOOTH EXTRACTION      Current Outpatient Medications  Medication Sig Dispense Refill  . ADZENYS XR-ODT 18.8 MG TBED Take 1 tablet by mouth every morning.    [name] ALPRAZolam (XANAX) 1 MG tablet Take 1 mg by mouth at bedtime as needed for anxiety.    [name] amphetamine-dextroamphetamine (ADDERALL) 30 MG tablet Take 30 mg by mouth 2 (two) times daily. Morning and noon    . Cholecalciferol (VITAMIN D3) 1.25 MG (50000 UT) CAPS Take 1 capsule by mouth once a week. 12 capsule 8  . desloratadine (CLARINEX) 5 MG tablet Take 5 mg by mouth daily.    .  folic acid (FOLVITE) 1 MG tablet Take 2 tablets (2 mg total) by mouth daily. 60 tablet 12  . gabapentin (NEURONTIN) 100 MG capsule Take 100 mg by mouth in the morning, at noon, and at bedtime.    [name] SPRAVATO, 56 MG DOSE, 28 MG/DEVICE SOPK Place 2 sprays into the nose every 14 (fourteen) days.    [name] topiramate (TOPAMAX) 100 MG tablet Take 100 mg by mouth 2 (two) times daily.    [name]  TRINTELLIX 20 MG TABS tablet Take 20 mg by mouth daily.    [name] zolpidem (AMBIEN) 10 MG tablet Take 10 mg by mouth at bedtime.     No current facility-administered medications for this visit.     ALLERGIES: Flexeril [cyclobenzaprine hcl], Lamictal [lamotrigine], Latex, Seasonal ic  [cholestatin], and Other  Family History  Problem Relation Age of Onset  . Diabetes Mother   . Arthritis Mother   . Hyperlipidemia Mother   . Other Mother        VARICOSE VEINS; DECREASED HEART RATE; +Hysterectomy for unspecified reason  . Fibromyalgia Mother   . Colon polyps Mother        >10  . Depression Mother   . Dementia Mother   . Heart disease Father   . Hypertension Father   . Alcohol abuse Father   . Drug abuse Father   . Stroke Father   . Ovarian cancer Sister        dx. <43y  . Cervical cancer Sister 5  . Multiple sclerosis Brother   . Thyroid disease Brother        hypothyroidism  . Lung cancer Maternal Grandmother 74       secondhand smoke  . Depression Maternal Grandmother   . Miscarriages / Stillbirths Maternal Grandmother   . Other Maternal Grandmother        binswanger's disease  . Birth defects Maternal Aunt        EXTRA DIGITS; MUSCULAR DISEASE  . Early death Maternal Aunt 81  . COPD Maternal Grandfather   . Stroke Maternal Grandfather   . Early death Maternal Aunt        d. three days after birth; unknown cause  . Colon polyps Maternal Aunt        unspecified number  . Liver disease Paternal Aunt   . Other Son 25       amelioblastoma of the mouth  . Depression Maternal Aunt   . Heart attack  Paternal Uncle        in mid-50s  . Alcohol abuse Maternal Uncle   . Breast cancer Cousin        maternal  . Cancer Other     Social History   Socioeconomic History  . Marital status: Single    Spouse name: Not on file  . Number of children: 8  . Years of education: 52  . Highest education level: Not on file  Occupational History  . Occupation: CLINICAL RESEARCH  Tobacco Use  . Smoking status: Never Smoker  . Smokeless tobacco: Never Used  Substance and Sexual Activity  . Alcohol use: Yes    Alcohol/week: 1.0 standard drinks    Types: 1 Glasses of wine per week    Comment: maybe once or twice every 6 months  . Drug use: No  . Sexual activity: Not Currently    Partners: Male    Birth control/protection: Abstinence  Other Topics Concern  . Not on file  Social History Narrative   Lives at home with 4 of her 8 children   Right handed   Social Determinants of Health   Financial Resource Strain:   . Difficulty of Paying Living Expenses:   Food Insecurity:   . Worried About Charity fundraiser in the Last Year:   . Arboriculturist in the Last Year:   Transportation Needs:   . Film/video editor (Medical):   [name] Lack of Transportation (Non-Medical):   Physical Activity:   .  Days of Exercise per Week:   . Minutes of Exercise per Session:   Stress:   . Feeling of Stress :   Social Connections:   . Frequency of Communication with Friends and Family:   . Frequency of Social Gatherings with Friends and Family:   . Attends Religious Services:   . Active Member of Clubs or Organizations:   . Attends Archivist Meetings:   [name] Marital Status:   Intimate Partner Violence:   . Fear of Current or Ex-Partner:   . Emotionally Abused:   [name] Physically Abused:   . Sexually Abused:     Review of Systems  Genitourinary: Positive for frequency and urgency.  All other systems reviewed and are negative.   PHYSICAL EXAMINATION:    BP 130/72   Pulse (!) 121   Temp  98.1 F (36.7 C)   Ht 5\' 6"  (1.676 m)   Wt 247 lb (112 kg)   SpO2 97%   BMI 39.87 kg/m     General appearance: alert, cooperative and appears stated age CVA: not tender Abdomen: soft, mildly tender in BLQ (not suprapubic region), no rebound, no guarding; non distended, no masses,  no organomegaly  Urine dip: trace blood, 1+ leuk  ASSESSMENT New onset urge incontinence with intermittent urinary frequency and urgency. Suspect UTI     PLAN Send urine for ua, c&s Treat with Bactrim (patient requested diflucan with the antibiotic)

## 2019-08-26 ENCOUNTER — Encounter: Payer: Self-pay | Admitting: Obstetrics and Gynecology

## 2019-08-26 LAB — URINALYSIS, MICROSCOPIC ONLY
Casts: NONE SEEN /lpf
RBC, Urine: NONE SEEN /hpf (ref 0–2)

## 2019-08-27 ENCOUNTER — Telehealth: Payer: Self-pay

## 2019-08-27 LAB — URINE CULTURE

## 2019-08-27 MED ORDER — OXYBUTYNIN CHLORIDE ER 5 MG PO TB24: 5.0000 mg | ORAL_TABLET | Freq: Every day | ORAL | 0 refills | Status: DC

## 2019-08-27 NOTE — Telephone Encounter (Signed)
[facility]  Phone Number: [phone]  Hi Dr. Talbert Nan,   I would like to know if I could schedule another appointment with you regarding any cervical issues. I was seen in [place] by a hematologist and she asked that I follow up with you regarding cervical issues. She stated that a possible malignancy could be present involving the cervix or esophageal. She stated it was off the record but to follow up as a precaution.    I forgot to address the concern during our visit yesterday additionally I knew you were short in time.    I'll try scheduling an appointment online with you.  However if that doesn't work I will contact the office directly.   Thanks,  [name]

## 2019-08-27 NOTE — Telephone Encounter (Signed)
Spoke with pt. Pt given results and recommendations per Dr Talbert Nan. Pt ok to try OAB medication for now. Declines pelvic PT at this time.  Reviewed with Dr Talbert Nan and can take Ditropan 5mg  TB 24 daily x 2 weeks #30, 0RF. Call with update in 2 weeks. Rx sent to pharmacy on file.   Informed pt of Rx .Pt agreeable and verbalized understanding. Will call in 2 weeks to give update to Dr Talbert Nan. Pt agreeable.   Routing to Dr Talbert Nan for review.  Encounter closed.

## 2019-08-27 NOTE — Telephone Encounter (Signed)
Pap 03/2019- normal pap with +HR HPV H/o Iron def anemia d/t malabsorption with lap band.  FH ovarian cancer History of DVT of lower extremity 2016  Spoke with pt. Pt was seen for OV yesterday for possible UTI. Results pending.   Pt states meant to update Dr Talbert Nan about hematology results and needing to have repeat pap smear. Pt states had second opinion at [facility] with new hematologist that states she could have malignancy involving the cervix or esophageal based on recent labs and exams. Pt was advised by hematologist to have repeat pap with GYN.  Advised pt that she is due for repeat pap smear in 03/2020 at [facility] appt as noted in results on 03/2019 pap and to keep AEX appt for this year. Pt agreeable.  Pt advised to have hematologist records sent to our office to have Dr Talbert Nan to review. Pt states will send through portal and if cant, then will have them faxed. Pt states was just seen at [facility] 5/13 and had labs and CT scan that showed no signs of PE with the elevated D-Dimer as well.  Pt advised will give update to Dr Talbert Nan and return call to pt if any further evaluation or recommendations. Pt agreeable.   Routing to Dr Talbert Nan for review.  Encounter closed.

## 2019-08-27 NOTE — Telephone Encounter (Signed)
-----   Message from Salvadore Dom, MD sent at 08/27/2019  3:24 PM EDT ----- Please let the patient know that her urine is negative for infection. She should stop the antibiotic. If she is continuing to have symptoms of overactive bladder her 2 options for treatment are physical therapy and medication. The medication can cause dry mouth and constipation.

## 2019-10-07 ENCOUNTER — Other Ambulatory Visit: Payer: Self-pay

## 2019-10-07 ENCOUNTER — Inpatient Hospital Stay: Payer: Managed Care, Other (non HMO) | Attending: Genetic Counselor

## 2019-10-07 ENCOUNTER — Encounter: Payer: Self-pay | Admitting: Hematology & Oncology

## 2019-10-07 ENCOUNTER — Telehealth: Payer: Self-pay | Admitting: Hematology & Oncology

## 2019-10-07 ENCOUNTER — Inpatient Hospital Stay (HOSPITAL_BASED_OUTPATIENT_CLINIC_OR_DEPARTMENT_OTHER): Payer: Managed Care, Other (non HMO) | Admitting: Hematology & Oncology

## 2019-10-07 VITALS — BP 144/90 | HR 102 | Temp 98.2°F | Resp 18 | Wt 260.0 lb

## 2019-10-07 DIAGNOSIS — K909 Intestinal malabsorption, unspecified: Secondary | ICD-10-CM | POA: Diagnosis present

## 2019-10-07 DIAGNOSIS — D573 Sickle-cell trait: Secondary | ICD-10-CM | POA: Insufficient documentation

## 2019-10-07 DIAGNOSIS — Z79899 Other long term (current) drug therapy: Secondary | ICD-10-CM | POA: Insufficient documentation

## 2019-10-07 DIAGNOSIS — D510 Vitamin B12 deficiency anemia due to intrinsic factor deficiency: Secondary | ICD-10-CM

## 2019-10-07 DIAGNOSIS — N920 Excessive and frequent menstruation with regular cycle: Secondary | ICD-10-CM | POA: Diagnosis not present

## 2019-10-07 DIAGNOSIS — E538 Deficiency of other specified B group vitamins: Secondary | ICD-10-CM

## 2019-10-07 DIAGNOSIS — D500 Iron deficiency anemia secondary to blood loss (chronic): Secondary | ICD-10-CM

## 2019-10-07 DIAGNOSIS — D508 Other iron deficiency anemias: Secondary | ICD-10-CM | POA: Diagnosis not present

## 2019-10-07 DIAGNOSIS — E559 Vitamin D deficiency, unspecified: Secondary | ICD-10-CM | POA: Diagnosis not present

## 2019-10-07 DIAGNOSIS — I82402 Acute embolism and thrombosis of unspecified deep veins of left lower extremity: Secondary | ICD-10-CM | POA: Diagnosis not present

## 2019-10-07 DIAGNOSIS — Z86718 Personal history of other venous thrombosis and embolism: Secondary | ICD-10-CM | POA: Diagnosis not present

## 2019-10-07 LAB — CBC WITH DIFFERENTIAL (CANCER CENTER ONLY)
Abs Immature Granulocytes: 0.02 10*3/uL (ref 0.00–0.07)
Basophils Absolute: 0 10*3/uL (ref 0.0–0.1)
Basophils Relative: 1 %
Eosinophils Absolute: 0.2 10*3/uL (ref 0.0–0.5)
Eosinophils Relative: 5 %
HCT: 40.8 % (ref 36.0–46.0)
Hemoglobin: 13.5 g/dL (ref 12.0–15.0)
Immature Granulocytes: 1 %
Lymphocytes Relative: 49 %
Lymphs Abs: 1.9 10*3/uL (ref 0.7–4.0)
MCH: 29.9 pg (ref 26.0–34.0)
MCHC: 33.1 g/dL (ref 30.0–36.0)
MCV: 90.5 fL (ref 80.0–100.0)
Monocytes Absolute: 0.4 10*3/uL (ref 0.1–1.0)
Monocytes Relative: 10 %
Neutro Abs: 1.3 10*3/uL — ABNORMAL LOW (ref 1.7–7.7)
Neutrophils Relative %: 34 %
Platelet Count: 275 10*3/uL (ref 150–400)
RBC: 4.51 MIL/uL (ref 3.87–5.11)
RDW: 13.6 % (ref 11.5–15.5)
WBC Count: 3.8 10*3/uL — ABNORMAL LOW (ref 4.0–10.5)
nRBC: 0 % (ref 0.0–0.2)

## 2019-10-07 LAB — VITAMIN B12: Vitamin B-12: 142 pg/mL — ABNORMAL LOW (ref 180–914)

## 2019-10-07 LAB — RETICULOCYTES
Immature Retic Fract: 21.7 % — ABNORMAL HIGH (ref 2.3–15.9)
RBC.: 4.47 MIL/uL (ref 3.87–5.11)
Retic Count, Absolute: 88.5 10*3/uL (ref 19.0–186.0)
Retic Ct Pct: 2 % (ref 0.4–3.1)

## 2019-10-07 LAB — VITAMIN D 25 HYDROXY (VIT D DEFICIENCY, FRACTURES): Vit D, 25-Hydroxy: 41.37 ng/mL (ref 30–100)

## 2019-10-07 NOTE — Telephone Encounter (Signed)
Appointments scheduled calendar declined due to My Chart per 6/30 los

## 2019-10-07 NOTE — Progress Notes (Signed)
Hematology and Oncology Follow Up Visit  Tiffany [name] [id number] March 16, 1976 44 y.o. 10/07/2019   Principle Diagnosis:  Iron deficiency anemia secondary to malabsorption (lap band surgery 2009) and heavy cycles Sickle cell trait Vitamin D deficiency  History of left lower extremity DVT - resolved   Current Therapy:        IV iron as indicated  Folic acid 2 mg PO daily Vitamin D 50,000 units PO once a week    Interim History:  Tiffany [name] is here today for follow-up.  She is doing pretty good.  She unfortunately is having problems with her lower back.  She keeps getting injections to the nerve root at L4-L5.  It sounds like she is going need to have some kind of nerve ablation for this.  She seems to be doing okay with her anemia.  She does not complain too much of fatigue.  She is still quite cautious with the coronavirus.  She has not yet had her coronavirus vaccine because of the back injections.  Her iron studies we last saw her in March looked okay.  Her ferritin was 37 with an iron saturation of 46%.  I noted that her B12 level was little bit on the low normal side back in March.  It was 204.  She has had no bleeding.  Is been no nausea or vomiting.  She has had no issues with swelling.  She is on occasion her right ankle does swell up.  She does have the history of bypass for the stomach.  I think she sees the surgeon next week.  Overall, I would say performance status is ECOG 1.    Medications:  Allergies as of 10/07/2019      Reactions   Flexeril [cyclobenzaprine Hcl] Shortness Of Breath, Itching, Swelling, Rash   Swelling of throat   Lamictal [lamotrigine] Rash   Latex Shortness Of Breath, Itching, Rash   Seasonal Ic  [cholestatin] Other (See Comments)   Other Rash   RUBBER      Medication List       Accurate as of October 07, 2019 10:40 AM. If you have any questions, ask your nurse or doctor.        Adzenys XR-ODT 18.8 MG Tbed Generic drug: Amphetamine  ER Take 1 tablet by mouth every morning.   amphetamine-dextroamphetamine 30 MG tablet Commonly known as: ADDERALL Take 30 mg by mouth 2 (two) times daily. Morning and noon   clonazePAM 1 MG tablet Commonly known as: KLONOPIN Take by mouth.   desloratadine 5 MG tablet Commonly known as: CLARINEX Take 5 mg by mouth daily.   desvenlafaxine 100 MG 24 hr tablet Commonly known as: PRISTIQ Take 100 mg by mouth daily.   fluconazole 150 MG tablet Commonly known as: DIFLUCAN Take 150 mg by mouth once.   folic acid 1 MG tablet Commonly known as: FOLVITE Take 2 tablets (2 mg total) by mouth daily.   gabapentin 100 MG capsule Commonly known as: NEURONTIN Take 100 mg by mouth in the morning, at noon, and at bedtime.   mirtazapine 15 MG tablet Commonly known as: REMERON Take 15 mg by mouth at bedtime.   oxybutynin 5 MG 24 hr tablet Commonly known as: Ditropan XL Take 1 tablet (5 mg total) by mouth daily.   PROBIOTIC DAILY PO Take by mouth.   Spravato (56 MG Dose) 28 MG/DEVICE Sopk Generic drug: Esketamine HCl (56 MG Dose) Place 2 sprays into the nose every 14 (fourteen) days.  sulfamethoxazole-trimethoprim 800-160 MG tablet Commonly known as: Bactrim DS Take 1 tablet by mouth 2 (two) times daily. One PO BID x 3 days   topiramate 100 MG tablet Commonly known as: TOPAMAX Take 100 mg by mouth 2 (two) times daily.   triamcinolone cream 0.1 % Commonly known as: KENALOG Apply topically.   Trintellix 20 MG Tabs tablet Generic drug: vortioxetine HBr Take 20 mg by mouth daily.   Vitamin D3 1.25 MG (50000 UT) Caps Take 1 capsule by mouth once a week.   Xanax 1 MG tablet Generic drug: ALPRAZolam Take 1 mg by mouth at bedtime as needed for anxiety.   zolpidem 10 MG tablet Commonly known as: AMBIEN Take 10 mg by mouth at bedtime.       Allergies:  Allergies  Allergen Reactions  . Flexeril [Cyclobenzaprine Hcl] Shortness Of Breath, Itching, Swelling and Rash     Swelling of throat  . Lamictal [Lamotrigine] Rash  . Latex Shortness Of Breath, Itching and Rash  . Seasonal Ic  [Cholestatin] Other (See Comments)  . Other Rash    RUBBER    Past Medical History, Surgical history, Social history, and Family History were reviewed and updated.  Review of Systems: Review of Systems  Constitutional: Negative.   HENT: Negative.   Eyes: Negative.   Respiratory: Negative.   Cardiovascular: Negative.   Gastrointestinal: Negative.   Genitourinary: Negative.   Musculoskeletal: Negative.   Skin: Negative.   Neurological: Negative.   Endo/Heme/Allergies: Negative.   Psychiatric/Behavioral: Negative.       Physical Exam:  weight is 260 lb (117.9 kg). Her oral temperature is 98.2 F (36.8 C). Her blood pressure is 144/90 (abnormal) and her pulse is 102 (abnormal). Her respiration is 18 and oxygen saturation is 99%.   Wt Readings from Last 3 Encounters:  10/07/19 260 lb (117.9 kg)  08/25/19 247 lb (112 kg)  07/07/19 221 lb 1.9 oz (100.3 kg)    Physical Exam Vitals reviewed.  HENT:     Head: Normocephalic and atraumatic.  Eyes:     Pupils: Pupils are equal, round, and reactive to light.  Cardiovascular:     Rate and Rhythm: Normal rate and regular rhythm.     Heart sounds: Normal heart sounds.  Pulmonary:     Effort: Pulmonary effort is normal.     Breath sounds: Normal breath sounds.  Abdominal:     General: Bowel sounds are normal.     Palpations: Abdomen is soft.  Musculoskeletal:        General: No tenderness or deformity. Normal range of motion.     Cervical back: Normal range of motion.  Lymphadenopathy:     Cervical: No cervical adenopathy.  Skin:    General: Skin is warm and dry.     Findings: No erythema or rash.  Neurological:     Mental Status: She is alert and oriented to person, place, and time.  Psychiatric:        Behavior: Behavior normal.        Thought Content: Thought content normal.        Judgment: Judgment  normal.      Lab Results  Component Value Date   WBC 3.8 (L) 10/07/2019   HGB 13.5 10/07/2019   HCT 40.8 10/07/2019   MCV 90.5 10/07/2019   PLT 275 10/07/2019   Lab Results  Component Value Date   FERRITIN 37 07/07/2019   IRON 145 (H) 07/07/2019   TIBC 315 07/07/2019   UIBC  170 07/07/2019   IRONPCTSAT 46 07/07/2019   Lab Results  Component Value Date   RETICCTPCT 2.0 10/07/2019   RBC 4.47 10/07/2019   RBC 4.51 10/07/2019   No results found for: KPAFRELGTCHN, LAMBDASER, KAPLAMBRATIO No results found for: IGGSERUM, IGA, IGMSERUM No results found for: Odetta Pink, SPEI   Chemistry      Component Value Date/Time   NA 141 07/07/2019 1040   NA 138 03/10/2019 1019   K 3.6 07/07/2019 1040   CL 103 07/07/2019 1040   CO2 32 07/07/2019 1040   BUN 13 07/07/2019 1040   BUN 8 03/10/2019 1019   CREATININE 0.99 07/07/2019 1040   CREATININE 0.75 06/10/2018 1013      Component Value Date/Time   CALCIUM 9.4 07/07/2019 1040   CALCIUM 8.7 12/26/2018 0835   ALKPHOS 46 07/07/2019 1040   AST 17 07/07/2019 1040   ALT 12 07/07/2019 1040   BILITOT 0.5 07/07/2019 1040       Impression and Plan: Tiffany [name] is a 44 yo African American female with history of iron deficiency anemia, vitamin D deficiency and sickle cell trait. She has malabsorption due to previous lap band surgery.   We will see what all of today's lab work shows and bring her back in for replacement if needed.   We will have to watch the vitamin B12 level.  When we see her back, we will see what the level shows.  We will go ahead and plan to see her back in another 3 months.   She will contact our office with any questions or concerns. We can certainly see her sooner if needed.   [name], MD 6/30/202110:40 AM

## 2019-10-07 NOTE — Addendum Note (Signed)
Addended by: Burney Gauze R on: 10/07/2019 11:06 AM   Modules accepted: Orders

## 2019-10-08 LAB — FERRITIN: Ferritin: 28 ng/mL (ref 11–307)

## 2019-10-08 LAB — IRON AND TIBC
Iron: 94 ug/dL (ref 41–142)
Saturation Ratios: 32 % (ref 21–57)
TIBC: 296 ug/dL (ref 236–444)
UIBC: 203 ug/dL (ref 120–384)

## 2019-12-24 ENCOUNTER — Other Ambulatory Visit: Payer: Self-pay | Admitting: Obstetrics and Gynecology

## 2019-12-24 DIAGNOSIS — R32 Unspecified urinary incontinence: Secondary | ICD-10-CM

## 2019-12-24 NOTE — Telephone Encounter (Signed)
Medication refill request: oxybutinin ER 5mg   Last AEX:  03/10/19 Next AEX: 03/10/20 Last MMG (if hormonal medication request): NA Refill authorized: 30/2

## 2020-01-07 ENCOUNTER — Other Ambulatory Visit: Payer: Managed Care, Other (non HMO)

## 2020-01-07 ENCOUNTER — Ambulatory Visit: Payer: Managed Care, Other (non HMO) | Admitting: Family

## 2020-01-12 ENCOUNTER — Inpatient Hospital Stay: Payer: Managed Care, Other (non HMO) | Attending: Hematology & Oncology

## 2020-01-12 ENCOUNTER — Inpatient Hospital Stay: Payer: Managed Care, Other (non HMO) | Admitting: Family

## 2020-03-09 NOTE — Progress Notes (Deleted)
44 y.o. [id number] Single Black or African American Hispanic or Latino female here for annual exam.      No LMP recorded. (Menstrual status: Irregular Periods).          Sexually active: {yes no:314532}  The current method of family planning is {contraception:315051}.    Exercising: {yes no:314532}  {types:19826} Smoker:  {YES NO:22349}  Health Maintenance: Pap:03/10/2019 WNL HR HPV + 02-18-15 Neg:Neg HR HPV History of abnormal Pap:  Yes  +HPV MMG:  03/11/19 density B Bi-rads Benign  BMD:   NA Colonoscopy: 03/21/16 Polyps with dr. Collene Mares  TDaP:  03/26/16  Gardasil: NA   reports that she has never smoked. She has never used smokeless tobacco. She reports current alcohol use of about 1.0 standard drink of alcohol per week. She reports that she does not use drugs.  Past Medical History:  Diagnosis Date  . Abnormal Pap smear 1997   LAST PAP 10/2010  . Allergy    SEASONAL  . Anemia    CHRONIC  . 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 Arthritis   . Blood dyscrasia    SICKLE CELL TRAIT  . Blood transfusion without reported diagnosis 2005   after vaginal delivery  . Breast mass in female 2003  . Broken foot 03/16/2016   right  . Bruises easily   . BV (bacterial vaginosis)   . Cancer ([place])   . Cyst of ovary 2003  . Depression 2003  . DUB (dysfunctional uterine bleeding) 2003  . DVT, lower extremity ([place]) 10/2014   left leg  . Family history of breast cancer   . Family history of colonic polyps   . Family history of ovarian cancer   . Fatigue 06/10/2018  . Fibroid    LONG TERM DX  . Fibromyalgia 2004  . Fibrositis   . GERD (gastroesophageal reflux disease) 2007  . H/O amenorrhea 2006  . H/O menorrhagia 2011  . H/O sickle cell trait   . Heart murmur 2007  . History of abnormal cervical Pap smear 2003   had colposcopy and cryo.  Follow up paps are normal.  . Hx: UTI (urinary tract infection) 2003  . Hypertension 2005   RESOLVED WITH WT LOSS  . IBD (inflammatory bowel disease)    . IBS (irritable bowel syndrome)   . Infection    OCC YEAST  . Iron deficiency anemia due to chronic blood loss 04/02/2019  . Iron malabsorption 04/02/2019  . Low back pain 2003  . Menometrorrhagia 04/02/2019  . Menses, irregular 2003  . Migraine   . Nausea and vomiting 06/10/2018  . Neck pain 06/10/2018  . Night sweats 06/10/2018  . Obese 2003  . Personal history of colonic polyps   . Pulmonary embolism ([place]) 10/2014  . Sacroiliitis ([place])   . Sickle-cell trait ([place])   . Suspected COVID-19 virus infection 07/28/2018  . Trichomonas   . Uterine leiomyoma   . Vaginitis and vulvovaginitis 2004  . Weight loss 06/10/2018  . Yeast vaginitis 04/24/2010    Past Surgical History:  Procedure Laterality Date  . AUGMENTATION MAMMAPLASTY  3419   Silicone implants  . broken ankle Right   . CERVICAL CONE BIOPSY  1997  . COSMETIC SURGERY  2012   thigh lift  . CYSTECTOMY Right    obtain date from patient, was not on history form dated 06/16/10--benign cyst removed Rt.ovary  . DILATION AND CURETTAGE OF UTERUS    . LAPAROSCOPIC GASTRIC BANDING  05/20/2007  .  ORIF ACETABULAR FRACTURE Right 12/25/2018   Procedure: OPEN REDUCTION INTERNAL FIXATION (ORIF) ACETABULAR FRACTURE;  Surgeon: Altamese Pittsboro, MD;  Location: [facility];  Service: Orthopedics;  Laterality: Right;  . WISDOM TOOTH EXTRACTION      Current Outpatient Medications  Medication Sig Dispense Refill  . ADZENYS XR-ODT 18.8 MG TBED Take 1 tablet by mouth every morning.    [name] ALPRAZolam (XANAX) 1 MG tablet Take 1 mg by mouth at bedtime as needed for anxiety.    [name] amphetamine-dextroamphetamine (ADDERALL) 30 MG tablet Take 30 mg by mouth 2 (two) times daily. Morning and noon    . Cholecalciferol (VITAMIN D3) 1.25 MG (50000 UT) CAPS Take 1 capsule by mouth once a week. 12 capsule 8  . clonazePAM (KLONOPIN) 1 MG tablet Take by mouth.    . desloratadine (CLARINEX) 5 MG tablet Take 5 mg by mouth daily.    [name] desvenlafaxine (PRISTIQ) 100 MG 24 hr tablet  Take 100 mg by mouth daily.    . fluconazole (DIFLUCAN) 150 MG tablet Take 150 mg by mouth once.    . folic acid (FOLVITE) 1 MG tablet Take 2 tablets (2 mg total) by mouth daily. 60 tablet 12  . gabapentin (NEURONTIN) 100 MG capsule Take 100 mg by mouth in the morning, at noon, and at bedtime.    . mirtazapine (REMERON) 15 MG tablet Take 15 mg by mouth at bedtime.    [name] oxybutynin (DITROPAN-XL) 5 MG 24 hr tablet TAKE 1 TABLET(5 MG) BY MOUTH DAILY 30 tablet 2  . Probiotic Product (PROBIOTIC DAILY PO) Take by mouth.    . SPRAVATO, 56 MG DOSE, 28 MG/DEVICE SOPK Place 2 sprays into the nose every 14 (fourteen) days.    [name] sulfamethoxazole-trimethoprim (BACTRIM DS) 800-160 MG tablet Take 1 tablet by mouth 2 (two) times daily. One PO BID x 3 days 6 tablet 0  . topiramate (TOPAMAX) 100 MG tablet Take 100 mg by mouth 2 (two) times daily.    [name] triamcinolone cream (KENALOG) 0.1 % Apply topically.    . TRINTELLIX 20 MG TABS tablet Take 20 mg by mouth daily.    [name] zolpidem (AMBIEN) 10 MG tablet Take 10 mg by mouth at bedtime.     No current facility-administered medications for this visit.    Family History  Problem Relation Age of Onset  . Diabetes Mother   . Arthritis Mother   . Hyperlipidemia Mother   . Other Mother        VARICOSE VEINS; DECREASED HEART RATE; +Hysterectomy for unspecified reason  . Fibromyalgia Mother   . Colon polyps Mother        >10  . Depression Mother   . Dementia Mother   . Heart disease Father   . Hypertension Father   . Alcohol abuse Father   . Drug abuse Father   . Stroke Father   . Ovarian cancer Sister        dx. <43y  . Cervical cancer Sister 62  . Multiple sclerosis Brother   . Thyroid disease Brother        hypothyroidism  . Lung cancer Maternal Grandmother 74       secondhand smoke  . Depression Maternal Grandmother   . Miscarriages / Stillbirths Maternal Grandmother   . Other Maternal Grandmother        binswanger's disease  . Birth defects Maternal  Aunt        EXTRA DIGITS; MUSCULAR DISEASE  . Early death Maternal Aunt 42  .  COPD Maternal Grandfather   . Stroke Maternal Grandfather   . Early death Maternal Aunt        d. three days after birth; unknown cause  . Colon polyps Maternal Aunt        unspecified number  . Liver disease Paternal Aunt   . Other Son 25       amelioblastoma of the mouth  . Depression Maternal Aunt   . Heart attack Paternal Uncle        in mid-50s  . Alcohol abuse Maternal Uncle   . Breast cancer Cousin        maternal  . Cancer Other     Review of Systems  Exam:   There were no vitals taken for this visit.  Weight change: @WEIGHTCHANGE @ Height:      Ht Readings from Last 3 Encounters:  08/25/19 5\' 6"  (1.676 m)  07/07/19 5\' 6"  (1.676 m)  06/18/19 5\' 6"  (1.676 m)    General appearance: alert, cooperative and appears stated age Head: Normocephalic, without obvious abnormality, atraumatic Neck: no adenopathy, supple, symmetrical, trachea midline and thyroid {CHL AMB PHY EX THYROID NORM DEFAULT:[phone]::"normal to inspection and palpation"} Lungs: clear to auscultation bilaterally Cardiovascular: regular rate and rhythm Breasts: {Exam; breast:13139::"normal appearance, no masses or tenderness"} Abdomen: soft, non-tender; non distended,  no masses,  no organomegaly Extremities: extremities normal, atraumatic, no cyanosis or edema Skin: Skin color, texture, turgor normal. No rashes or lesions Lymph nodes: Cervical, supraclavicular, and axillary nodes normal. No abnormal inguinal nodes palpated Neurologic: Grossly normal   Pelvic: External genitalia:  no lesions              Urethra:  normal appearing urethra with no masses, tenderness or lesions              Bartholins and Skenes: normal                 Vagina: normal appearing vagina with normal color and discharge, no lesions              Cervix: {CHL AMB PHY EX CERVIX NORM DEFAULT:[phone]::"no lesions"}               Bimanual Exam:   Uterus:  {CHL AMB PHY EX UTERUS NORM DEFAULT:[phone]::"normal size, contour, position, consistency, mobility, non-tender"}              Adnexa: {CHL AMB PHY EX ADNEXA NO MASS DEFAULT:[phone]::"no mass, fullness, tenderness"}               Rectovaginal: Confirms               Anus:  normal sphincter tone, no lesions  *** chaperoned for the exam.  A:  Well Woman with normal exam  P:

## 2020-03-10 ENCOUNTER — Telehealth: Payer: Self-pay

## 2020-03-10 ENCOUNTER — Ambulatory Visit: Payer: Managed Care, Other (non HMO) | Admitting: Obstetrics and Gynecology

## 2020-03-10 ENCOUNTER — Encounter: Payer: Self-pay | Admitting: Obstetrics and Gynecology

## 2020-03-10 NOTE — Telephone Encounter (Signed)
Patient did not keep AEX for today.

## 2020-03-12 IMAGING — US US BREAST*R* LIMITED INC AXILLA
1 series · 2 of 2 positions shown · non-contrast
Comparison: Previous exam(s).

CLINICAL DATA: Recall from screening for a possible asymmetry in
the right breast.

EXAM:
DIGITAL DIAGNOSTIC RIGHT MAMMOGRAM WITH CAD AND TOMO
ULTRASOUND RIGHT BREAST

[Series 1: us breast*right* limited inc axilla · 0.04mm/px · 2 of 2 slices shown]
[im 1/2]
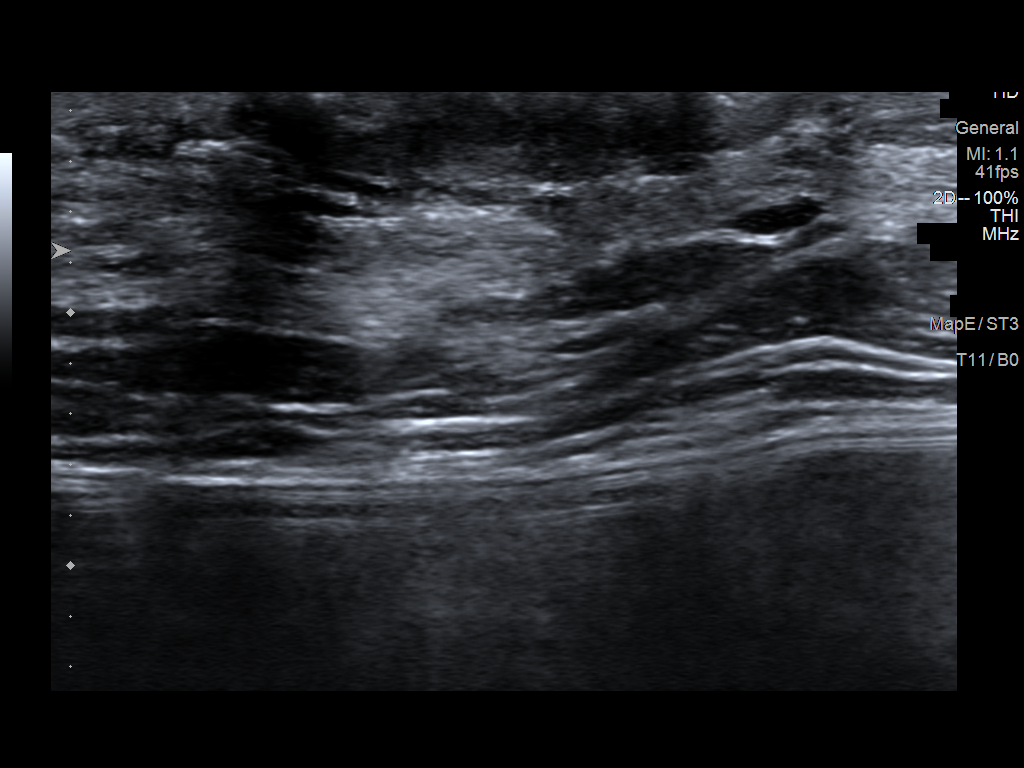
[im 2/2]
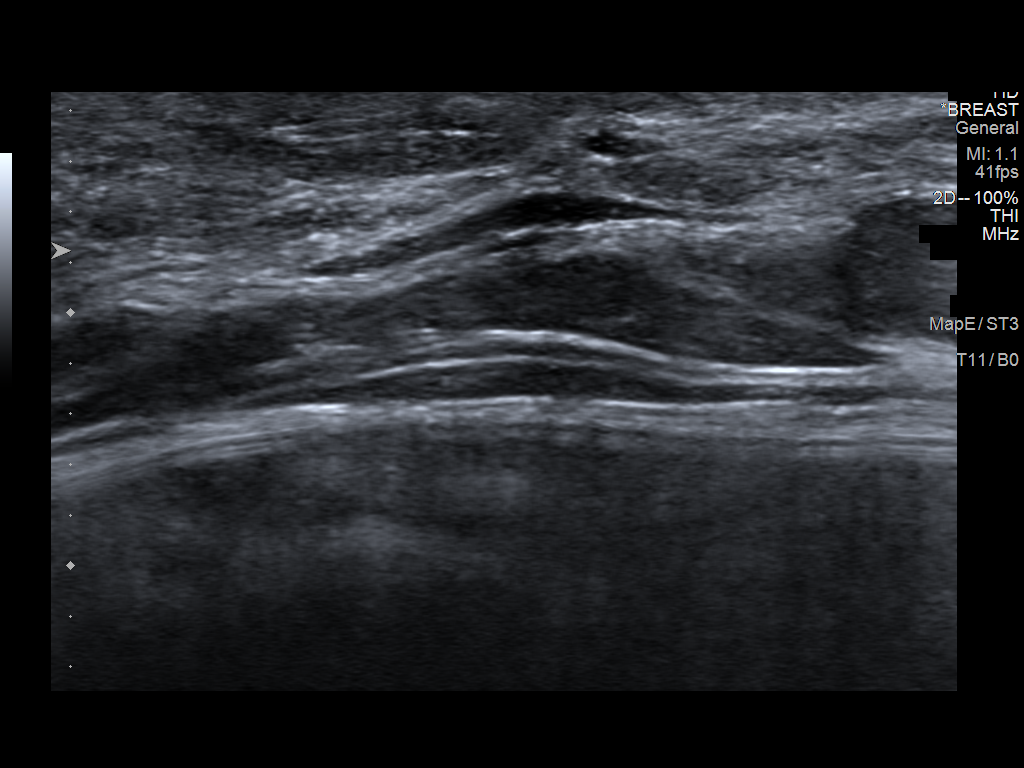

[2 of 2 positions shown; findings below may reference images not displayed]

ACR Breast Density Category b: There are scattered areas of
fibroglandular density.
FINDINGS: On the diagnostic spot-compression CC images, the possible asymmetry
persists. It appears as an oval elongated area in the inferior,
retroareolar region of the right breast, not visualized on the mL
images. There are no areas of architectural distortion and there are
no suspicious calcifications.

Mammographic images were processed with CAD.

On physical exam, no mass is palpated in the inferior retroareolar
right breast.

Targeted ultrasound is performed, showing a mildly dilated duct in
the infra-areolar, 6 o'clock position of the right breast, which
corresponds to the asymmetry noted mammographically. There is no
intraductal mass or tissue. No other abnormalities.
IMPRESSION: 1. No evidence of breast malignancy.
2. Mildly dilated duct along the 6 o'clock retroareolar right breast
corresponds to the mammographic asymmetry.

RECOMMENDATION:
Screening mammogram in one year.(Code:OS-Y-LOZ)

I have discussed the findings and recommendations with the patient.
If applicable, a reminder letter will be sent to the patient
regarding the next appointment.

BI-RADS CATEGORY  2: Benign.

## 2020-04-23 ENCOUNTER — Other Ambulatory Visit: Payer: Self-pay | Admitting: Hematology & Oncology

## 2020-07-07 ENCOUNTER — Encounter (INDEPENDENT_AMBULATORY_CARE_PROVIDER_SITE_OTHER): Payer: Self-pay

## 2020-10-13 ENCOUNTER — Encounter: Payer: Self-pay | Admitting: Family

## 2020-10-13 NOTE — Telephone Encounter (Signed)
Erroneous encounter

## 2020-12-27 ENCOUNTER — Other Ambulatory Visit: Payer: Self-pay | Admitting: *Deleted

## 2020-12-27 DIAGNOSIS — R32 Unspecified urinary incontinence: Secondary | ICD-10-CM
